# Patient Record
Sex: Female | Born: 1935 | Race: White | Hispanic: No | Smoking: Never smoker
Health system: Southern US, Community
[De-identification: ages and names within clinical notes are randomized; demographics above are authoritative.]

## PROBLEM LIST (undated history)

## (undated) DIAGNOSIS — Z9889 Other specified postprocedural states: Secondary | ICD-10-CM

## (undated) DIAGNOSIS — R351 Nocturia: Secondary | ICD-10-CM

## (undated) DIAGNOSIS — K219 Gastro-esophageal reflux disease without esophagitis: Secondary | ICD-10-CM

## (undated) DIAGNOSIS — H349 Unspecified retinal vascular occlusion: Secondary | ICD-10-CM

## (undated) DIAGNOSIS — N952 Postmenopausal atrophic vaginitis: Secondary | ICD-10-CM

## (undated) DIAGNOSIS — R3915 Urgency of urination: Secondary | ICD-10-CM

## (undated) DIAGNOSIS — Z87440 Personal history of urinary (tract) infections: Secondary | ICD-10-CM

## (undated) DIAGNOSIS — Z8742 Personal history of other diseases of the female genital tract: Secondary | ICD-10-CM

## (undated) DIAGNOSIS — N362 Urethral caruncle: Secondary | ICD-10-CM

## (undated) DIAGNOSIS — M1990 Unspecified osteoarthritis, unspecified site: Secondary | ICD-10-CM

## (undated) DIAGNOSIS — R32 Unspecified urinary incontinence: Secondary | ICD-10-CM

## (undated) DIAGNOSIS — Z4689 Encounter for fitting and adjustment of other specified devices: Secondary | ICD-10-CM

## (undated) DIAGNOSIS — I10 Essential (primary) hypertension: Secondary | ICD-10-CM

## (undated) DIAGNOSIS — E7800 Pure hypercholesterolemia, unspecified: Secondary | ICD-10-CM

## (undated) DIAGNOSIS — D259 Leiomyoma of uterus, unspecified: Secondary | ICD-10-CM

## (undated) DIAGNOSIS — R112 Nausea with vomiting, unspecified: Secondary | ICD-10-CM

## (undated) DIAGNOSIS — Z780 Asymptomatic menopausal state: Secondary | ICD-10-CM

## (undated) DIAGNOSIS — M810 Age-related osteoporosis without current pathological fracture: Secondary | ICD-10-CM

## (undated) HISTORY — DX: Age-related osteoporosis without current pathological fracture: M81.0

## (undated) HISTORY — DX: Personal history of other diseases of the female genital tract: Z87.42

## (undated) HISTORY — DX: Encounter for fitting and adjustment of other specified devices: Z46.89

## (undated) HISTORY — DX: Unspecified retinal vascular occlusion: H34.9

## (undated) HISTORY — DX: Other specified postprocedural states: Z98.890

## (undated) HISTORY — PX: THYROGLOSSAL DUCT CYST: SHX297

## (undated) HISTORY — DX: Urethral caruncle: N36.2

## (undated) HISTORY — DX: Pure hypercholesterolemia, unspecified: E78.00

## (undated) HISTORY — DX: Personal history of urinary (tract) infections: Z87.440

## (undated) HISTORY — PX: TOTAL HIP ARTHROPLASTY: SHX124

## (undated) HISTORY — DX: Essential (primary) hypertension: I10

## (undated) HISTORY — PX: CATARACT EXTRACTION: SUR2

## (undated) HISTORY — PX: THYROIDECTOMY: SHX17

## (undated) HISTORY — PX: HERNIA REPAIR: SHX51

## (undated) HISTORY — PX: TONSILLECTOMY: SHX5217

## (undated) HISTORY — DX: Asymptomatic menopausal state: Z78.0

## (undated) HISTORY — PX: TONSILLECTOMY: SUR1361

## (undated) HISTORY — DX: Postmenopausal atrophic vaginitis: N95.2

## (undated) HISTORY — PX: EYE SURGERY: SHX253

## (undated) HISTORY — DX: Leiomyoma of uterus, unspecified: D25.9

---

## 2004-10-11 ENCOUNTER — Ambulatory Visit: Payer: Self-pay | Admitting: Family Medicine

## 2004-10-20 ENCOUNTER — Ambulatory Visit: Payer: Self-pay | Admitting: Family Medicine

## 2005-10-13 ENCOUNTER — Ambulatory Visit: Payer: Self-pay | Admitting: Family Medicine

## 2006-10-15 ENCOUNTER — Ambulatory Visit: Payer: Self-pay | Admitting: Family Medicine

## 2007-10-24 ENCOUNTER — Ambulatory Visit: Payer: Self-pay | Admitting: Family Medicine

## 2007-10-28 ENCOUNTER — Inpatient Hospital Stay (HOSPITAL_COMMUNITY): Admission: RE | Admit: 2007-10-28 | Discharge: 2007-10-31 | Payer: Self-pay | Admitting: Orthopedic Surgery

## 2007-12-02 ENCOUNTER — Ambulatory Visit: Payer: Self-pay | Admitting: Family Medicine

## 2008-11-10 ENCOUNTER — Ambulatory Visit: Payer: Self-pay | Admitting: Family Medicine

## 2009-11-15 ENCOUNTER — Ambulatory Visit: Payer: Self-pay | Admitting: Family Medicine

## 2010-05-17 NOTE — Op Note (Signed)
NAMEDONNALYN, JURAN              ACCOUNT NO.:  [id number]   MEDICAL RECORD NO.:  [id number]          PATIENT TYPE:  INP   LOCATION:  0007                         FACILITY:  [facility]   PHYSICIAN:  Ollen Gross, M.D.    DATE OF BIRTH:  27-Oct-1935   DATE OF PROCEDURE:  10/28/2007  DATE OF DISCHARGE:                               OPERATIVE REPORT   PREOPERATIVE DIAGNOSIS:  Osteoarthritis left hip.   POSTOPERATIVE DIAGNOSIS:  Osteoarthritis left hip.   PROCEDURE:  Left total hip arthroplasty.   SURGEON:  Ollen Gross, M.D.   ASSISTANT:  Avel Peace PA-C   ANESTHESIA:  Spinal.   ESTIMATED BLOOD LOSS:  300 mL.   DRAINS:  Hemovac x1.   COMPLICATIONS:  None.   CONDITION:  Stable to recovery room.   CLINICAL NOTE:  Ms. Seybold is a 75 year old female with end-stage  arthritis of the left hip with progressively worsening pain and  dysfunction.  She has failed nonoperative management and presents for  total hip arthroplasty.   PROCEDURE IN DETAIL:  Successful administration of spinal anesthetic,  the patient is placed in the right lateral decubitus position with the  left side up and held with the hip positioner.  The lower extremity was  isolated from her perineum with plastic drapes and prepped and draped in  the usual sterile fashion.  Short posterolateral incision was made with  a 10 blade through subcutaneous tissue to the level of fascia lata which  was incised in line with skin incision.  The sciatic nerve was palpated  and protected and short external rotators isolated off the femur.  Capsulectomy is performed and the hip was dislocated.  The center of  femoral head is marked and the trial prosthesis is placed such that the  center of the trial head corresponds to the center of her native femoral  head.  Osteotomy line was marked on the femoral neck and osteotomy made  with an oscillating saw.  Femoral head was removed and the femur  retracted anteriorly to gain  acetabular exposure.   Acetabular retractors were placed and labrum and osteophytes removed.  Reaming starts at 45 mm coursing in increments of 2 to 51 mm and then a  52-mm Pinnacle acetabular shell was placed in anatomic position and  transfixed with two dome screws.  Trial 32-mm neutral +4 liner was  placed.   The femur was prepared with a canal finder and irrigation.  Axial  reaming is performed to 13.5 mm, proximal reaming to 18D and the sleeve  machined to a large.  18 D large trial sleeve is placed and an 18 x 13  stem and 36 plus 8 neck matching her native anteversion.  The 32.0 head  is placed and the hip reduces easily so went to 32.6 which had more  appropriate soft tissue tension.  She had great stability with full  extension, full external rotation, 70 degrees flexion, 40 degrees  abduction, 90 degrees internal rotation and 90 degrees of flexion and 70  degrees of internal rotation.  By placing the left leg on top of the  right, the leg lengths felt equal.  The hip was then dislocated.  All  trials were removed.  The permanent apex hole eliminator was placed with  the permanent 32 mm neutral +4 marathon liner.  On the femoral side we  did the 18 D large sleeve with 18 x 13 stem and 36 plus 8 neck matching  native anteversion.  The 32 plus 6 head is placed and the hips reduced  with the same stability parameters.  The wound was copiously irrigated  with saline solution and short rotators reattached to the femur through  drill holes.  Fascia lata was closed over Hemovac drain with interrupted  #1 Vicryl.  Subcu was closed with #1-0 and #2-0 Vicryl and subcuticular  running 4-0 Monocryl.  The drain was hooked to suction.  Incision  cleaned and dried and Steri-Strips and bulky sterile dressing applied.  The left lower extremity was then placed into a knee immobilizer and  the patient was awakened and transferred to recovery in stable  condition.      Ollen Gross, M.D.   Electronically Signed     FA/MEDQ  D:  10/28/2007  T:  10/28/2007  Job:  [id number]

## 2010-05-20 NOTE — H&P (Signed)
Haley Schneider, Haley Schneider              ACCOUNT NO.:  [id number]   MEDICAL RECORD NO.:  [id number]          PATIENT TYPE:  INP   LOCATION:                               FACILITY:  [facility]   PHYSICIAN:  Ollen Gross, M.D.    DATE OF BIRTH:  [birth year]   DATE OF ADMISSION:  10/28/2007  DATE OF DISCHARGE:                              HISTORY & PHYSICAL   CHIEF COMPLAINT:  Left hip pain.   HISTORY OF PRESENT ILLNESS:  The patient is a 75 year old female with  hip pain on the left.  She denies any injuries.  It has been bothering  her for quite a while.  It has progressively worsened over time.  She  has pain with range of motion, pain with weight bearing.  The patient  has elected to proceed with a total hip arthroplasty due to end-stage  osteoarthritis in the left hip, bone on bone.   ALLERGIES:  None.   CURRENT MEDICATIONS:  1. Synthroid 50 mcg once a day.  2. Hydrochlorothiazide 25 mg once a day.  3. Evista 60 mg once a day.  4. Simvastatin 10 mg once a day.  5. Multiple vitamins.   PRIMARY CARE PHYSICIAN:  Dr. Vonita Moss.   CURRENT MEDICAL HISTORY:  1. Hypertension.  2. Hypercholesterolemia.  3. Thyroid disease.  4. Varicose veins.  5. Glaucoma.  6. Urinary incontinence due to dropped bladder with a pessary.   REVIEW OF SYSTEMS:  Negative for any neurological issues, no pulmonary  issues.  Denies any cardiovascular issues, no GI or GU issues other than  her urinary incontinence.  Pessary works very well.  Thyroid is the only  endocrine issue.  She denies any hematological issues.   PAST SURGICAL HISTORY:  1. Thyroidectomy in 1965.  2. Tonsillectomy in 1968.  3. Left groin hernia repair in early 1970s.   The patient's only complication was nausea and vomiting with anesthesia.   FAMILY MEDICAL HISTORY:  Father is deceased from lung cancer at the age  of 81.  Mother has Alzheimer's at the age of 30.   SOCIAL HISTORY:  The patient is widowed, never smoked, no alcohol  or  drugs, 2 grown children, lives alone.   PHYSICAL EXAMINATION:  VITAL SIGNS:  Height is 5 feet 6 inches, weight  is 67 pounds, blood pressure is 142/72, pulse 74 and regular,  respirations are 12, patient is afebrile.  GENERAL:  This is a healthy-appearing female, conscious, alert, and  appropriate.  Appears to be a good historian.  Ambulates with a cane on  her right side.  HEENT:  Head was normocephalic.  Pupils are equal, round, and reactive,  extraocular movements intact, gross hearing is intact.  NECK:  Supple, no palpable lymphadenopathy, good range of motion.  CHEST:  Lung sounds were clear and equal bilaterally, no wheezes, rales,  rhonchi.  HEART:  Regular rate and rhythm.  No murmurs.  ABDOMEN:  Soft, bowel sounds are present.  EXTREMITIES:  Upper extremities had good range of motion.  Motor  strength is 5/5.  Lower extremities: Left hip, she was able to  fully  extend.  She can flex it up to 90 degrees.  She had about 10 degrees of  internal/external rotation limited by pain and mechanical.  Right hip  had full extension, flexion up to 120 degrees with 30 degrees  internal/external rotation, both knees had full extension.  She has leg  varus deformities bilateral knees, calves were soft, good motion at the  ankles.  PERIPHERAL VASCULAR:  Carotid pulses were 2+, no bruits.  Radial pulses  were 2+.  Dorsalis pedis pulses were 1+.  She had a few scattered  varicosities about the lower extremities, no pigmentation changes.  NEURO:  The patient is conscious, alert, and appropriate.  She had no  gross neurologic defects noted.  BREAST/RECTAL/GU EXAMS:  Deferred at this time.   IMPRESSION:  1. End-stage osteoarthritis, left hip.  2. Hypertension.  3. Hypercholesterolemia.  4. Thyroid disease.  5. Urinary incontinence with dropped bladder and pessary.  6. Glaucoma.   PLAN:  The patient will undergo all routine labs and tests prior to  having a left total hip arthroplasty  by Dr. Lequita Halt at [facility] on October 28, 2007.      Jamelle Rushing, P.A.      Ollen Gross, M.D.  Electronically Signed    RWK/MEDQ  D:  09/30/2007  T:  09/30/2007  Job:  [id number]

## 2010-05-20 NOTE — Discharge Summary (Signed)
NAMELAQUITHA, HESLIN              ACCOUNT NO.:  [id number]   MEDICAL RECORD NO.:  [id number]          PATIENT TYPE:  INP   LOCATION:  1604                         FACILITY:  [facility]   PHYSICIAN:  Ollen Gross, M.D.    DATE OF BIRTH:  [birth year]   DATE OF ADMISSION:  10/28/2007  DATE OF DISCHARGE:  10/31/2007                               DISCHARGE SUMMARY   ADMITTING DIAGNOSES:  1. End-stage arthritis, left hip.  2. Hypertension.  3. Hypercholesterolemia.  4. Thyroid disease.  5. Urinary incontinence.  6. Glaucoma.   DISCHARGE DIAGNOSES:  1. Osteoarthritis, left hip status post left total hip replacement      arthroplasty.  2. Mild postop hyponatremia, improving.  3. Postop hypokalemia, improved.  4. Hypertension.  5. Hypercholesterolemia.  6. Thyroid disease.  7. Urinary incontinence.  8. Glaucoma.   PROCEDURE:  October 28, 2007, left total hip.  Surgeon, Dr. Lequita Halt.  Assistant, Avel Peace, P.A.-C.  Spinal anesthesia.   CONSULTS:  None.   BRIEF HISTORY:  Ms. Hands is a 75 year old female with end-stage  arthritis, left hip, progressive, worsening pain and dysfunction, now  presents for total hip arthroplasty.   LABORATORY DATA:  Preop CBC showed hemoglobin 13.8, hematocrit of 39.2,  white cell count 7.8.  Chem panel showed a little low sodium of 132,  chloride low at 94, remaining Chem panel within normal limits.  Serial  CBCs were followed.  Hemoglobin dropped to 11.1 then 10.7, last noted H  and H 9.4 and 27.4.  PT/INR 14.3 and 29 respectively.  INR 1.1.  Serial  pro times followed per Coumadin protocol.  Last noted PT/INR 19.2 and  1.5.  serial BMETs were followed.  Sodium did come back up to 135, then  got as low as 133, then came back up to 134.  Potassium started out  normal, dropped down to 3.3, back up to 4.5.  blood group type O-.   EKG, October 25, 2007, sinus rhythm, occasional premature ventricular  complexes, unconfirmed.   Left hip films, October 25, 2007, __________changes, OA, left hip.  Two-  view chest, October 25, 2007, hyperaeration of the lungs, no acute chest  findings.  Portable hip and pelvis film, October 28, 2007, well-seated  components, left total hip.   HOSPITAL COURSE:  Patient admitted to [facility], tolerated  procedure well, later transferred to the recovery room on orthopedic  floor.  Started on PCA and p.o. analgesics.  Given Coumadin for DVT  prophylaxis.  Had a little bit of pain on the morning of surgery.  She  needed to take her Synthroid medication early in the morning so we  changed the dosage time on that.  She is having a little bit of pain so  we left the PCA.  Blood pressure was stable.  Hemoglobin looked good.  Her output was excellent.  Started getting up with therapy, did well  walking over 50 feet.  By day 2, dressing was changed.  Incision looked  good.  Continued to progress with therapy.  Sodium and potassium were a  little low so we  stopped the fluids and gave her potassium supplements.  Pressure was stable.  Incisions healing well.  Continued to progress  with therapy and by day 3 she was doing well, meeting her goals,  tolerating her meds, and discharged home.   DISCHARGE PLAN:  1. Patient discharged home, October 31, 2007.  2. Discharge diagnoses:  Please see above.  3. Discharge meds:  Coumadin, Percocet, and Robaxin.   FOLLOWUP:  Two weeks.   DIET:  Low-sodium, low-cholesterol diet.   ACTIVITY:  Partial weightbearing, 25% to 50%, left lower extremity.  Home health PT and home health nursing.   DISPOSITION:  Home.   CONDITION UPON DISCHARGE:  Improving.      Alexzandrew L. Perkins, P.A.C.      Ollen Gross, M.D.  Electronically Signed    ALP/MEDQ  D:  12/16/2007  T:  12/16/2007  Job:  [id number]   cc:   Ollen Gross, M.D.  Fax: [phone]   Vonita Moss, M.D.

## 2010-10-04 LAB — COMPREHENSIVE METABOLIC PANEL
AST: 22
BUN: 8
Total Bilirubin: 0.6

## 2010-10-04 LAB — BASIC METABOLIC PANEL
BUN: 5 — ABNORMAL LOW
BUN: 6
CO2: 31
Calcium: 8.8
Calcium: 8.9
Creatinine, Ser: 0.59
GFR calc Af Amer: >60
GFR calc Af Amer: >60
GFR calc non Af Amer: >60
Glucose, Bld: 105 — ABNORMAL HIGH
Glucose, Bld: 108 — ABNORMAL HIGH
Glucose, Bld: 89
Potassium: 3.3 — ABNORMAL LOW
Potassium: 3.8
Potassium: 4.5
Sodium: 133 — ABNORMAL LOW
Sodium: 134 — ABNORMAL LOW

## 2010-10-04 LAB — CBC
HCT: 31.8 — ABNORMAL LOW
Hemoglobin: 10.7 — ABNORMAL LOW
Hemoglobin: 11.1 — ABNORMAL LOW
Hemoglobin: 9.4 — ABNORMAL LOW
MCHC: 33.6
MCV: 87.3
MCV: 88
Platelets: 213
Platelets: 236
RDW: 13.9
RDW: 14
WBC: 8.5

## 2010-10-04 LAB — PROTIME-INR
INR: 1.1
INR: 1.1
INR: 1.3

## 2010-10-04 LAB — TYPE AND SCREEN
ABO/RH(D): O NEG
Antibody Screen: NEGATIVE

## 2010-10-04 LAB — ABO/RH: ABO/RH(D): O NEG

## 2010-11-30 ENCOUNTER — Ambulatory Visit: Payer: Self-pay | Admitting: Family Medicine

## 2011-01-17 ENCOUNTER — Ambulatory Visit: Payer: Self-pay | Admitting: Otolaryngology

## 2011-02-15 ENCOUNTER — Ambulatory Visit: Payer: Self-pay | Admitting: Otolaryngology

## 2011-02-15 LAB — CBC WITH DIFFERENTIAL/PLATELET
Basophil #: 0 10*3/uL (ref 0.0–0.1)
Eosinophil #: 0.1 10*3/uL (ref 0.0–0.7)
HCT: 41.1 % (ref 35.0–47.0)
HGB: 13.5 g/dL (ref 12.0–16.0)
Lymphocyte #: 2.2 10*3/uL (ref 1.0–3.6)
Lymphocyte %: 32.4 %
MCH: 28.3 pg (ref 26.0–34.0)
MCHC: 32.8 g/dL (ref 32.0–36.0)
MCV: 86 fL (ref 80–100)
Monocyte %: 7.9 %
Neutrophil #: 4 10*3/uL (ref 1.4–6.5)
RDW: 14.3 % (ref 11.5–14.5)

## 2011-02-15 LAB — BASIC METABOLIC PANEL
Anion Gap: 8 (ref 7–16)
BUN: 9 mg/dL (ref 7–18)
Calcium, Total: 9.8 mg/dL (ref 8.5–10.1)
Chloride: 98 mmol/L (ref 98–107)
EGFR (Non-African Amer.): >60
Glucose: 80 mg/dL (ref 65–99)
Osmolality: 268 (ref 275–301)

## 2011-02-23 ENCOUNTER — Ambulatory Visit: Payer: Self-pay | Admitting: Otolaryngology

## 2011-12-05 ENCOUNTER — Ambulatory Visit: Payer: Self-pay | Admitting: Family Medicine

## 2012-12-09 ENCOUNTER — Ambulatory Visit: Payer: Self-pay | Admitting: Family Medicine

## 2013-04-10 DIAGNOSIS — E785 Hyperlipidemia, unspecified: Secondary | ICD-10-CM | POA: Insufficient documentation

## 2013-04-10 DIAGNOSIS — I10 Essential (primary) hypertension: Secondary | ICD-10-CM | POA: Insufficient documentation

## 2013-04-10 DIAGNOSIS — M810 Age-related osteoporosis without current pathological fracture: Secondary | ICD-10-CM | POA: Insufficient documentation

## 2013-04-10 DIAGNOSIS — M1990 Unspecified osteoarthritis, unspecified site: Secondary | ICD-10-CM | POA: Insufficient documentation

## 2013-04-10 DIAGNOSIS — E039 Hypothyroidism, unspecified: Secondary | ICD-10-CM | POA: Insufficient documentation

## 2013-08-16 IMAGING — CT CT NECK WITH CONTRAST
1 of 2 series · 9 of 14 positions shown, 12 images · IV contrast (agent unspecified)
Comparison: none

REASON FOR EXAM: neck mass
COMMENTS:

PROCEDURE:     KCT - KCT NECK WITH CONTRAST  - January 17, 2011  [DATE]
RESULT:
TECHNIQUE: Helical 3 mm sections are obtained from the skull base to the
vertex status post intravenous administration of 75 ml of [id number].

[Series 2: neck 3.0 3 · axial · 0.39mm/px · z∈[-291,-57]mm · 9 of 98 slices shown, 12 images]
[im 10/98  soft-tissue]
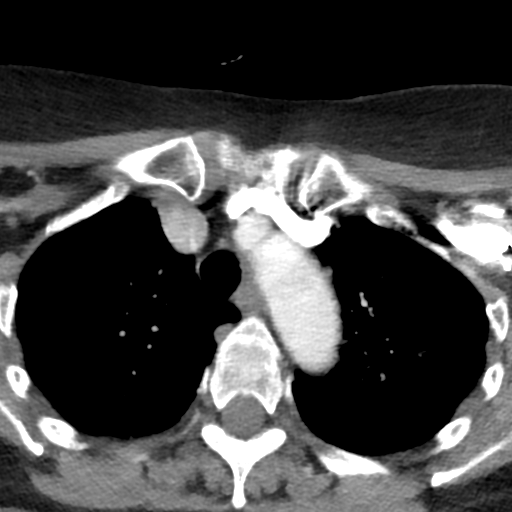
[im 10/98  bone]
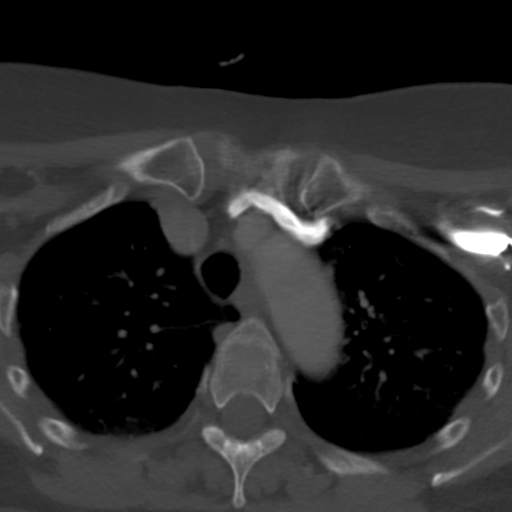
[im 20/98  bone]
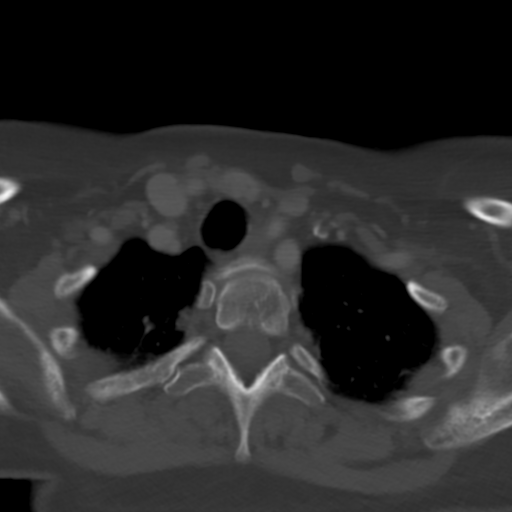
[im 30/98  bone]
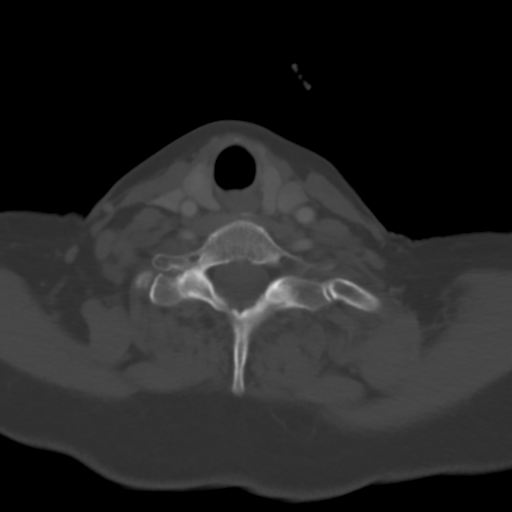
[im 39/98  bone]
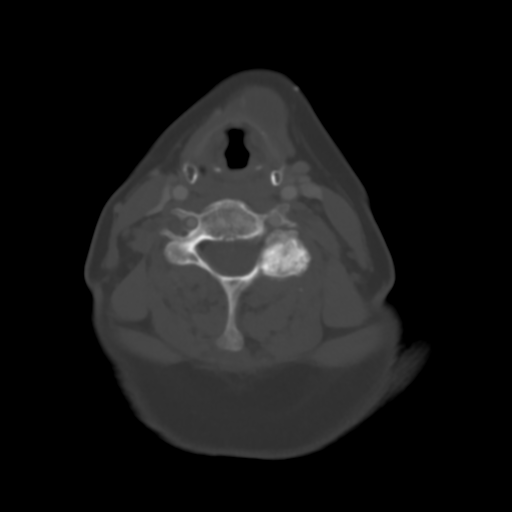
[im 49/98  soft-tissue]
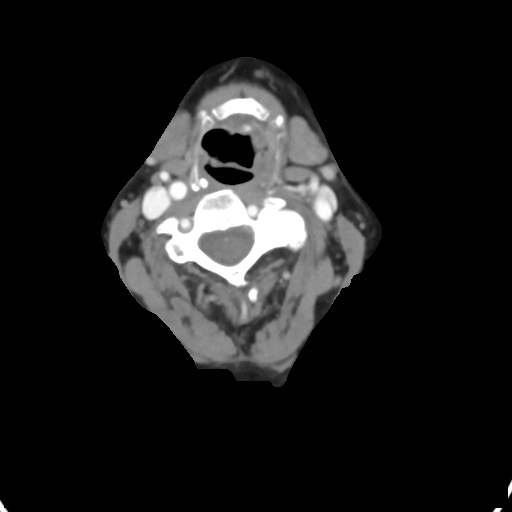
[im 49/98  bone]
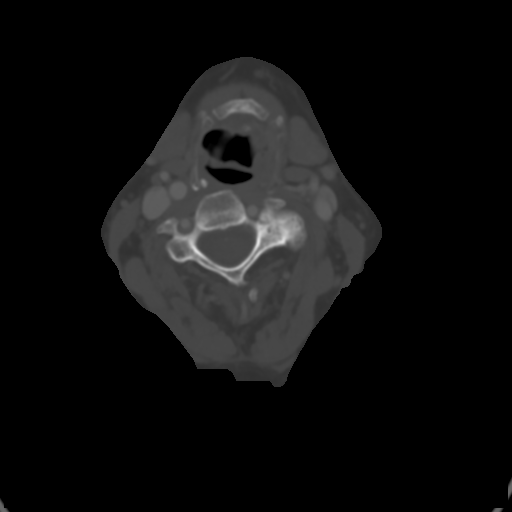
[im 59/98  bone]
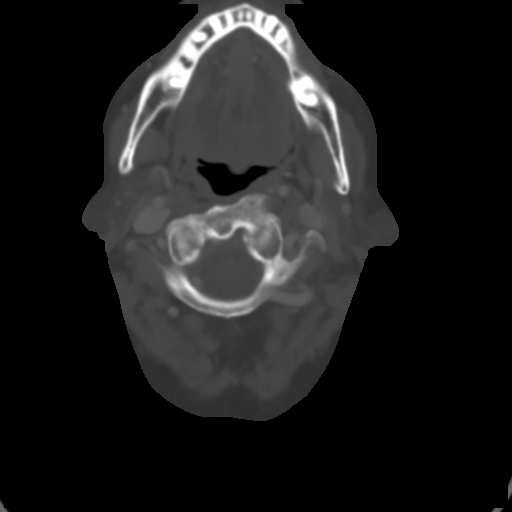
[im 68/98  bone]
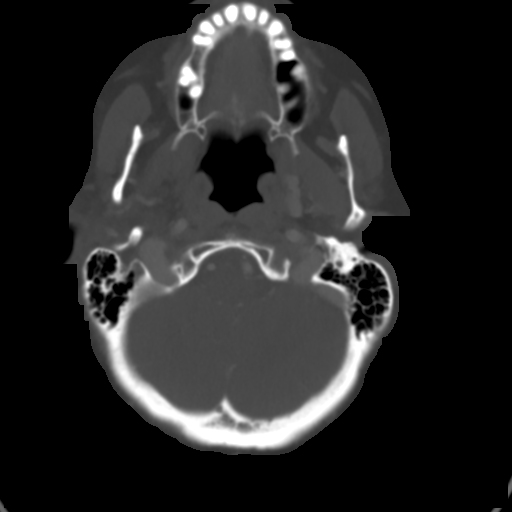
[im 78/98  bone]
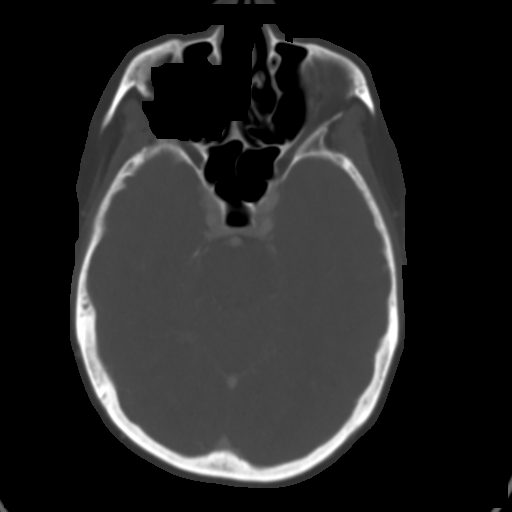
[im 88/98  soft-tissue]
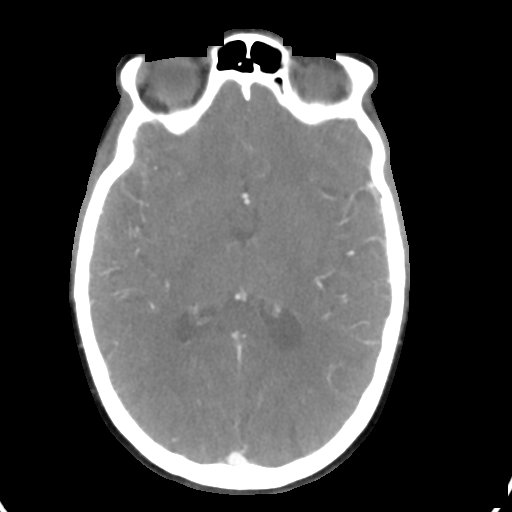
[im 88/98  bone]
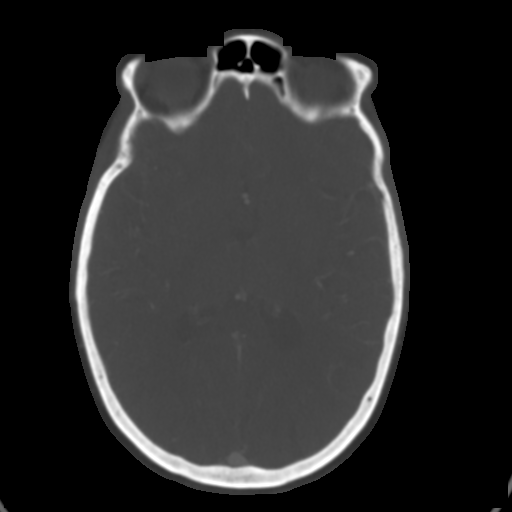

[9 of 14 positions shown; findings below may reference images not displayed]

FINDINGS: A 1.93 x 2.35 cm, cystic appearing mass with Hounsfield units of
19 projects within the region of palpable concern along the medial portion
of the neck just below the hyoid and extending to the level of the thyroid
cartilage. This area has a CT appearance of a thyroglossal duct cyst. A
non-benign component is not clearly identified on the CT scan though
considering the patient's age cannot be completely excluded.

No further neck masses, free fluid or loculated fluid collections are
identified. Small, subcentimeter lymph nodes are identified within the
anterior and posterior cervical chains and these appear to be within normal
limits. The parotid glands and submandibular glands are symmetric. The
glottal regions as well as the oral and nasopharyngeal regions are
unremarkable. The visualized spaces of the neck appear unremarkable. The
airway is patent. The vascular structures are unremarkable.
IMPRESSION: Cystic mass in the region of palpable concern with imaging
characteristics consistent with a thyroglossal duct cyst. Considering the
patient's age, a non-benign component cannot be excluded though is not
clearly appreciated on the present study.

## 2013-12-17 ENCOUNTER — Ambulatory Visit: Payer: Self-pay | Admitting: Family Medicine

## 2014-01-14 DIAGNOSIS — H3581 Retinal edema: Secondary | ICD-10-CM | POA: Diagnosis not present

## 2014-01-14 DIAGNOSIS — H34832 Tributary (branch) retinal vein occlusion, left eye: Secondary | ICD-10-CM | POA: Diagnosis not present

## 2014-02-04 DIAGNOSIS — H34832 Tributary (branch) retinal vein occlusion, left eye: Secondary | ICD-10-CM | POA: Diagnosis not present

## 2014-02-04 DIAGNOSIS — H3581 Retinal edema: Secondary | ICD-10-CM | POA: Diagnosis not present

## 2014-03-03 DIAGNOSIS — I10 Essential (primary) hypertension: Secondary | ICD-10-CM | POA: Diagnosis not present

## 2014-03-18 DIAGNOSIS — M1711 Unilateral primary osteoarthritis, right knee: Secondary | ICD-10-CM | POA: Diagnosis not present

## 2014-03-18 DIAGNOSIS — Z96642 Presence of left artificial hip joint: Secondary | ICD-10-CM | POA: Diagnosis not present

## 2014-03-18 DIAGNOSIS — Z471 Aftercare following joint replacement surgery: Secondary | ICD-10-CM | POA: Diagnosis not present

## 2014-03-18 DIAGNOSIS — M76822 Posterior tibial tendinitis, left leg: Secondary | ICD-10-CM | POA: Diagnosis not present

## 2014-03-18 DIAGNOSIS — M76821 Posterior tibial tendinitis, right leg: Secondary | ICD-10-CM | POA: Diagnosis not present

## 2014-03-18 DIAGNOSIS — M1611 Unilateral primary osteoarthritis, right hip: Secondary | ICD-10-CM | POA: Diagnosis not present

## 2014-04-01 DIAGNOSIS — B49 Unspecified mycosis: Secondary | ICD-10-CM | POA: Diagnosis not present

## 2014-04-01 DIAGNOSIS — N952 Postmenopausal atrophic vaginitis: Secondary | ICD-10-CM | POA: Diagnosis not present

## 2014-04-01 DIAGNOSIS — N819 Female genital prolapse, unspecified: Secondary | ICD-10-CM | POA: Diagnosis not present

## 2014-04-01 DIAGNOSIS — Z4689 Encounter for fitting and adjustment of other specified devices: Secondary | ICD-10-CM | POA: Diagnosis not present

## 2014-04-21 DIAGNOSIS — M1611 Unilateral primary osteoarthritis, right hip: Secondary | ICD-10-CM | POA: Diagnosis not present

## 2014-04-26 NOTE — Op Note (Signed)
PATIENT NAME:  Haley Schneider, Haley Schneider MR#:  [id number] DATE OF BIRTH:  02/28/1935  DATE OF PROCEDURE:  02/23/2011  PREOPERATIVE DIAGNOSIS: Thyroglossal duct cyst.  POSTOPERATIVE DIAGNOSIS: Thyroglossal duct cyst.   PROCEDURE: [place].   SURGEON: Janalee Dane, MD   ASSISTANT: Mahlon Gammon, MD   DESCRIPTION OF PROCEDURE: The patient was placed in the supine position on the operating room table. After general endotracheal anesthesia had been induced, the patient was placed on a shoulder roll. Neck crease was identified, locally anesthetized, prepped and draped in the usual fashion. A 15 blade was used to make a large fusiform incision around the lesion to give excellent exposure and subplatysmal flaps were elevated superiorly and inferiorly. The strap muscles were reflected and the central dissection was carried out from inferior to superior. The middle third of the hyoid bone was removed with bone nippers. The entire mass and central third of the hyoid was then sent oriented for pathology in formalin. The wound was closed over a 7 mm Jackson-Pratt drain. The patient was returned to anesthesia, allowed to emerge from anesthesia in the operating room, and taken to the recovery room in stable condition. There were no complications. Estimated blood loss 15 mL.  ____________________________ J. Nadeen Landau, MD jmc:drc D: 02/23/2011 20:33:09 ET T: 02/24/2011 07:21:12 ET JOB#: [id number]  cc: Janalee Dane, MD, <Dictator> Nicholos Johns MD ELECTRONICALLY SIGNED 03/06/2011 10:03

## 2014-04-28 DIAGNOSIS — M1611 Unilateral primary osteoarthritis, right hip: Secondary | ICD-10-CM | POA: Diagnosis not present

## 2014-04-28 DIAGNOSIS — M1711 Unilateral primary osteoarthritis, right knee: Secondary | ICD-10-CM | POA: Diagnosis not present

## 2014-05-20 DIAGNOSIS — E785 Hyperlipidemia, unspecified: Secondary | ICD-10-CM

## 2014-05-20 DIAGNOSIS — E039 Hypothyroidism, unspecified: Secondary | ICD-10-CM

## 2014-05-20 DIAGNOSIS — I10 Essential (primary) hypertension: Secondary | ICD-10-CM

## 2014-05-20 DIAGNOSIS — M8580 Other specified disorders of bone density and structure, unspecified site: Secondary | ICD-10-CM | POA: Insufficient documentation

## 2014-05-27 DIAGNOSIS — H3562 Retinal hemorrhage, left eye: Secondary | ICD-10-CM | POA: Diagnosis not present

## 2014-05-27 DIAGNOSIS — H43813 Vitreous degeneration, bilateral: Secondary | ICD-10-CM | POA: Diagnosis not present

## 2014-05-27 DIAGNOSIS — H3581 Retinal edema: Secondary | ICD-10-CM | POA: Diagnosis not present

## 2014-05-27 DIAGNOSIS — H34832 Tributary (branch) retinal vein occlusion, left eye: Secondary | ICD-10-CM | POA: Diagnosis not present

## 2014-06-10 ENCOUNTER — Encounter: Payer: Self-pay | Admitting: Family Medicine

## 2014-06-10 ENCOUNTER — Ambulatory Visit (INDEPENDENT_AMBULATORY_CARE_PROVIDER_SITE_OTHER): Payer: Medicare PPO | Admitting: Family Medicine

## 2014-06-10 VITALS — BP 149/86 | HR 81 | Temp 98.0°F | Wt 153.0 lb

## 2014-06-10 DIAGNOSIS — M8580 Other specified disorders of bone density and structure, unspecified site: Secondary | ICD-10-CM

## 2014-06-10 DIAGNOSIS — I10 Essential (primary) hypertension: Secondary | ICD-10-CM

## 2014-06-10 DIAGNOSIS — E785 Hyperlipidemia, unspecified: Secondary | ICD-10-CM

## 2014-06-10 DIAGNOSIS — N8110 Cystocele, unspecified: Secondary | ICD-10-CM | POA: Diagnosis not present

## 2014-06-10 DIAGNOSIS — E890 Postprocedural hypothyroidism: Secondary | ICD-10-CM

## 2014-06-10 MED ORDER — LOSARTAN POTASSIUM 50 MG PO TABS: 50.0000 mg | ORAL_TABLET | Freq: Every day | ORAL | Status: DC

## 2014-06-10 NOTE — Assessment & Plan Note (Signed)
Check TSH in 3 weeks with other labs; adjust med if needed

## 2014-06-10 NOTE — Assessment & Plan Note (Signed)
Managed by GYN

## 2014-06-10 NOTE — Assessment & Plan Note (Signed)
Stop amlodipine today and start new medicine; side effects may include dry cough; return in 3 weeks for check BMP; do NOT take extra potassium; limit sodium, red meat, try to follow DASH guidelines

## 2014-06-10 NOTE — Assessment & Plan Note (Signed)
Limit eggs, cheese, bacon, red meat; check lipids in 3 weeks

## 2014-06-10 NOTE — Patient Instructions (Addendum)
Your goal blood pressure is less than  150 on top Try to limit the sodium in your diet.  Ideally, consume less than 1.5 grams (less than 1,500mg ) per day. Do not add salt when cooking or at the table.  Check the sodium amount on labels when shopping, and choose items lower in sodium when given a choice. Eat a diet rich in fruits and vegetables and whole grains. STOP the amlodipine and START the new losartan for blood pressure Monitor your blood pressure occasionally would be helpful (family member could check or you could check if out and about at a pharmacy) Limit saturated fats Really try to stay off of chairs and ladders and prevent falls Three servings of calcium a day (kale, spinach, almonds, etc.) Return in 3 weeks for fasting labs and BP check with Amy Return in early August to see me  DASH Eating Plan DASH stands for "Dietary Approaches to Stop Hypertension." The DASH eating plan is a healthy eating plan that has been shown to reduce high blood pressure (hypertension). Additional health benefits may include reducing the risk of type 2 diabetes mellitus, heart disease, and stroke. The DASH eating plan may also help with weight loss. WHAT DO I NEED TO KNOW ABOUT THE DASH EATING PLAN? For the DASH eating plan, you will follow these general guidelines:  Choose foods with a percent daily value for sodium of less than 5% (as listed on the food label).  Use salt-free seasonings or herbs instead of table salt or sea salt.  Check with your health care provider or pharmacist before using salt substitutes.  Eat lower-sodium products, often labeled as "lower sodium" or "no salt added."  Eat fresh foods.  Eat more vegetables, fruits, and low-fat dairy products.  Choose whole grains. Look for the word "whole" as the first word in the ingredient list.  Choose fish and skinless chicken or [place] more often than red meat. Limit fish, poultry, and meat to 6 oz (170 g) each day.  Limit sweets,  desserts, sugars, and sugary drinks.  Choose heart-healthy fats.  Limit cheese to 1 oz (28 g) per day.  Eat more home-cooked food and less restaurant, buffet, and fast food.  Limit fried foods.  Cook foods using methods other than frying.  Limit canned vegetables. If you do use them, rinse them well to decrease the sodium.  When eating at a restaurant, ask that your food be prepared with less salt, or no salt if possible. WHAT FOODS CAN I EAT? Seek help from a dietitian for individual calorie needs. Grains Whole grain or whole wheat bread. Brown rice. Whole grain or whole wheat pasta. Quinoa, bulgur, and whole grain cereals. Low-sodium cereals. Corn or whole wheat flour tortillas. Whole grain cornbread. Whole grain crackers. Low-sodium crackers. Vegetables Fresh or frozen vegetables (raw, steamed, roasted, or grilled). Low-sodium or reduced-sodium tomato and vegetable juices. Low-sodium or reduced-sodium tomato sauce and paste. Low-sodium or reduced-sodium canned vegetables.  Fruits All fresh, canned (in natural juice), or frozen fruits. Meat and Other Protein Products Ground beef (85% or leaner), grass-fed beef, or beef trimmed of fat. Skinless chicken or [place]. Ground chicken or [place]. Pork trimmed of fat. All fish and seafood. Eggs. Dried beans, peas, or lentils. Unsalted nuts and seeds. Unsalted canned beans. Dairy Low-fat dairy products, such as skim or 1% milk, 2% or reduced-fat cheeses, low-fat ricotta or cottage cheese, or plain low-fat yogurt. Low-sodium or reduced-sodium cheeses. Fats and Oils Tub margarines without trans fats. Light or  reduced-fat mayonnaise and salad dressings (reduced sodium). Avocado. Safflower, olive, or canola oils. Natural peanut or almond butter. Other Unsalted popcorn and pretzels. The items listed above may not be a complete list of recommended foods or beverages. Contact your dietitian for more options. WHAT FOODS ARE NOT  RECOMMENDED? Grains White bread. White pasta. White rice. Refined cornbread. Bagels and croissants. Crackers that contain trans fat. Vegetables Creamed or fried vegetables. Vegetables in a cheese sauce. Regular canned vegetables. Regular canned tomato sauce and paste. Regular tomato and vegetable juices. Fruits Dried fruits. Canned fruit in light or heavy syrup. Fruit juice. Meat and Other Protein Products Fatty cuts of meat. Ribs, chicken wings, bacon, sausage, bologna, salami, chitterlings, fatback, hot dogs, bratwurst, and packaged luncheon meats. Salted nuts and seeds. Canned beans with salt. Dairy Whole or 2% milk, cream, half-and-half, and cream cheese. Whole-fat or sweetened yogurt. Full-fat cheeses or blue cheese. Nondairy creamers and whipped toppings. Processed cheese, cheese spreads, or cheese curds. Condiments Onion and garlic salt, seasoned salt, table salt, and sea salt. Canned and packaged gravies. Worcestershire sauce. Tartar sauce. Barbecue sauce. Teriyaki sauce. Soy sauce, including reduced sodium. Steak sauce. Fish sauce. Oyster sauce. Cocktail sauce. Horseradish. Ketchup and mustard. Meat flavorings and tenderizers. Bouillon cubes. Hot sauce. Tabasco sauce. Marinades. Taco seasonings. Relishes. Fats and Oils Butter, stick margarine, lard, shortening, ghee, and bacon fat. Coconut, palm kernel, or palm oils. Regular salad dressings. Other Pickles and olives. Salted popcorn and pretzels. The items listed above may not be a complete list of foods and beverages to avoid. Contact your dietitian for more information. WHERE CAN I FIND MORE INFORMATION? National Heart, Lung, and [employer] Institute: travelstabloid.com Document Released: 12/08/2010 Document Revised: 05/05/2013 Document Reviewed: 10/23/2012 [facility] Patient Information 2015 [place], [place]. This information is not intended to replace advice given to you by your health care provider. Make  sure you discuss any questions you have with your health care provider.

## 2014-06-10 NOTE — Progress Notes (Signed)
Patient ID: Haley Schneider, female   DOB: [birth year], 79 y.o.   MRN: [id number]   Subjective:   Haley Schneider is a 79 y.o. female here for follow-up today  Prob:  Hypertension Side effects from current medicine; wants to stop ccb and use old medicine Patient has had hypertension since 12-13 years ago  Checking blood pressure away from here?  no Hypertension-associated complications:  none If taking medicines, are you taking them regularly?  yes  Salt Trying to limit sodium / salt when buying foods at the grocery store?  yes Trying to limit added salt when cooking and at the table?  yes Sedentary lifestyle Exercise/activity level:  sedentary (essential NO activity), that's because of her right hip; needs replacement, disappointed that she can't be as active as usual Siblings / family history Does high blood pressure run in your family?   YES (father) Smoking Do you smoke?  no Snoring / sleep apnea Do you snore or have sleep apnea?  no sleep apnea, but she's a widow Stress Do you feel like you are under excessive stress?    YES but never one to not get through it Stroh's (alcohol) Do you drink alcohol  no  Sudafed (decongestants) Do you use any OTC decongestant products like Allegra-D, Claritin-D, Zyrtec-D, Tylenol Cold and Sinus, etc.?  no   Hip arthritis; scheduled for the end of August; will need pre-op clearance prior to surgery; will schedule and do labs then  Osteoporosis; she is on raloxifene daily; no side effects; takes calcium supplement; not a milk drinker; eats a salad daily; eats yogurt and ice cream; not many almonds, but does eat walnuts  Prob:  High cholesterol Patient has had high cholesterol for years Dietary intake: Eats chicken and red meat, loves pork; eats meat daily Do you eat more than 3 eggs per week  no Do you try to limit saturated fats in your diet?  NO  Exercise: Exercise/activity level:  sedentary (essential NO activity) Does high  cholesterol run in your family?   no   Past Medical History  Diagnosis Date  . OP (osteoporosis)   . Menopause   . Fibroid, uterine   . Hypercholesteremia   . History of abnormal cervical Pap smear   . Hypothyroid   . Hypertension   . Vaginal vault prolapse   . Vaginal atrophy   . Urethral caruncle   . History of urinary tract infection   . Pessary maintenance     # 5 ring with support  . Female bladder prolapse    Past Surgical History  Procedure Laterality Date  . Hernia repair    . Tonsillectomy    . Thyroidectomy    . Total hip arthroplasty    . Cataract extraction    . Thyroglossal duct cyst     Family History  Problem Relation Age of Onset  . Alzheimer's disease Mother   . Cancer Father     lung  . Hypertension Father   . Diabetes Son   . Stroke Paternal Grandfather    History  Substance Use Topics  . Smoking status: Never Smoker   . Smokeless tobacco: Never Used  . Alcohol Use: No   Current Outpatient Prescriptions on File Prior to Visit  Medication Sig Dispense Refill  . aspirin EC 81 MG tablet Take 81 mg by mouth daily.    . B Complex-C-E-Zn (B COMPLEX-C-E-ZINC) tablet Take 1 tablet by mouth daily.    . Biotin 10 MG CAPS  Take 1 mg by mouth daily.    Marland Kitchen levothyroxine (SYNTHROID, LEVOTHROID) 50 MCG tablet Take 50 mcg by mouth daily before breakfast.    . raloxifene (EVISTA) 60 MG tablet Take 60 mg by mouth daily.    . vitamin B-12 (CYANOCOBALAMIN) 100 MCG tablet Take 100 mcg by mouth daily.     No current facility-administered medications on file prior to visit.   No Known Allergies ------------- Review of Systems  Constitutional: Negative for fever and chills.  HENT: Negative for hearing loss.   Eyes:       Doesn't see well with the left eye retinal problem; 20/60 vision in that eye  Cardiovascular: Positive for leg swelling (thinks it is from amlodipine).  Gastrointestinal: Negative for blood in stool.  Skin: Negative for itching and rash.   Neurological: Negative for tremors.  Psychiatric/Behavioral: Negative for depression.       Just frustrated that she can't do as much with hip problem, hip pain, but no outright depression    No results found for: CHOL, HDL, LDLCALC, LDLDIRECT, TRIG, CHOLHDL No results found for: HGBA1C No results found for: Derl Barrow Lab Results  Component Value Date   NA 135* 02/15/2011   K 3.6 02/15/2011   CL 98 02/15/2011   CO2 29 02/15/2011   Lab Results  Component Value Date   CREATININE 0.59* 02/15/2011   Lab Results  Component Value Date   WBC 6.9 02/15/2011   HGB 13.5 02/15/2011   HCT 41.1 02/15/2011   MCV 86 02/15/2011   PLT 285 02/15/2011    Objective:   Filed Vitals:   06/10/14 1056  BP: 149/86  Pulse: 81  Temp: 98 F (36.7 C)  Weight: 153 lb (69.4 kg)  SpO2: 95%   Body mass index is 25.87 kg/(m^2). Wt Readings from Last 3 Encounters:  06/10/14 153 lb (69.4 kg)  12/11/13 145 lb (65.772 kg)   Physical Exam  Constitutional: She appears well-developed and well-nourished.  HENT:  Head: Normocephalic and atraumatic.  Mucous membranes moist  Eyes: EOM are normal. No scleral icterus.  Neck: No thyromegaly present.  Cardiovascular: Normal rate, regular rhythm and normal heart sounds.   No murmur heard. Pulmonary/Chest: Effort normal and breath sounds normal. No respiratory distress.  Abdominal: She exhibits no distension.  Musculoskeletal: She exhibits edema. She exhibits no tenderness.  Thoracic kyphosis noted; no erythema or increased warmth in the legs  Neurological: She is alert.  Skin: Skin is warm and dry. No rash noted.  Psychiatric: She has a normal mood and affect. Her behavior is normal.    Assessment/Plan:   Problem List Items Addressed This Visit      Cardiovascular and Mediastinum   Hypertension - Primary    Stop amlodipine today and start new medicine; side effects may include dry cough; return in 3 weeks for check BMP; do NOT take  extra potassium; limit sodium, red meat, try to follow DASH guidelines      Relevant Medications   losartan (COZAAR) 50 MG tablet     Endocrine   Hypothyroidism    Check TSH in 3 weeks with other labs; adjust med if needed        Musculoskeletal and Integument   Osteopenia    Last DEXA reviewed; pointed out thoracic kyphosis, risk of fracture; she does not want DEXA scan ordered; three servings of calcium daily; check vit D in 3 weeks and see if supplement needed; fall precautions encouraged  Genitourinary   Female bladder prolapse    Managed by GYN        Other   Hyperlipidemia    Limit eggs, cheese, bacon, red meat; check lipids in 3 weeks      Relevant Medications   losartan (COZAAR) 50 MG tablet       Meds ordered this encounter  Medications  . traMADol (ULTRAM) 50 MG tablet    Sig:   . losartan (COZAAR) 50 MG tablet    Sig: Take 1 tablet (50 mg total) by mouth daily.    Dispense:  90 tablet    Refill:  3    Stopping the amlodipine   No orders of the defined types were placed in this encounter.    Follow up plan: Return in about 3 weeks (around 07/01/2014) for fasting labs and BP only; return to see me in early August.   An After Visit Summary was printed and given to the patient.

## 2014-06-10 NOTE — Assessment & Plan Note (Signed)
Last DEXA reviewed; pointed out thoracic kyphosis, risk of fracture; she does not want DEXA scan ordered; three servings of calcium daily; check vit D in 3 weeks and see if supplement needed; fall precautions encouraged

## 2014-06-25 ENCOUNTER — Telehealth: Payer: Self-pay | Admitting: Family Medicine

## 2014-06-25 NOTE — Telephone Encounter (Signed)
I talked with patient; she took 50 mg of losartan at 9 am and then took another at 10 am She cannot check her BP at home Reassurance given, 100 mg is not toxic amount, actually comes in that dose, but we'll bring her in this afternoon to check BP In the meantime, stay hydrated, eat a good lunch and come in around 1:15 or 1:30 for lab / CMA visit to check vitals (orthostatic set)

## 2014-07-01 ENCOUNTER — Encounter: Payer: Self-pay | Admitting: Obstetrics and Gynecology

## 2014-07-01 ENCOUNTER — Ambulatory Visit (INDEPENDENT_AMBULATORY_CARE_PROVIDER_SITE_OTHER): Payer: Medicare PPO | Admitting: Obstetrics and Gynecology

## 2014-07-01 VITALS — BP 121/78 | HR 76 | Ht 65.0 in | Wt 152.5 lb

## 2014-07-01 DIAGNOSIS — Z4689 Encounter for fitting and adjustment of other specified devices: Secondary | ICD-10-CM | POA: Diagnosis not present

## 2014-07-01 DIAGNOSIS — T8389XA Other specified complication of genitourinary prosthetic devices, implants and grafts, initial encounter: Secondary | ICD-10-CM

## 2014-07-01 DIAGNOSIS — N898 Other specified noninflammatory disorders of vagina: Secondary | ICD-10-CM

## 2014-07-01 DIAGNOSIS — N819 Female genital prolapse, unspecified: Secondary | ICD-10-CM

## 2014-07-01 NOTE — Progress Notes (Signed)
Patient ID: Haley Schneider, female   DOB: [birth year], 79 y.o.   MRN: [id number]   GYN ENCOUNTER NOTE  Subjective:       Haley Schneider is a 79 y.o.Marland Kitchenfemale with a history of vaginal prolapse who is here for follow up visit for pessary check. Last visit 04/01/14. Ring with support. Trimo-san Gel used weekly. No vaginal discharge, bleeding, odor, or pain. Normal bowel and bladder function.   Gynecologic History No LMP recorded. Patient is postmenopausal.  Obstetric History OB History  No data available    Past Medical History  Diagnosis Date  . OP (osteoporosis)   . Menopause   . Fibroid, uterine   . Hypercholesteremia   . History of abnormal cervical Pap smear   . Hypothyroid   . Hypertension   . Vaginal vault prolapse   . Vaginal atrophy   . Urethral caruncle   . History of urinary tract infection   . Pessary maintenance     # 5 ring with support  . Female bladder prolapse     Past Surgical History  Procedure Laterality Date  . Hernia repair    . Tonsillectomy    . Thyroidectomy    . Total hip arthroplasty    . Cataract extraction    . Thyroglossal duct cyst      Current Outpatient Prescriptions on File Prior to Visit  Medication Sig Dispense Refill  . aspirin EC 81 MG tablet Take 81 mg by mouth daily.    . B Complex-C-E-Zn (B COMPLEX-C-E-ZINC) tablet Take 1 tablet by mouth daily.    . Biotin 10 MG CAPS Take 1 mg by mouth daily.    Marland Kitchen levothyroxine (SYNTHROID, LEVOTHROID) 50 MCG tablet Take 50 mcg by mouth daily before breakfast.    . losartan (COZAAR) 50 MG tablet Take 1 tablet (50 mg total) by mouth daily. 90 tablet 3  . traMADol (ULTRAM) 50 MG tablet     . vitamin B-12 (CYANOCOBALAMIN) 100 MCG tablet Take 100 mcg by mouth daily.     No current facility-administered medications on file prior to visit.    No Known Allergies  History   Social History  . Marital Status: Widowed    Spouse Name: N/A  . Number of Children: N/A  . Years of Education: N/A    Occupational History  . Not on file.   Social History Main Topics  . Smoking status: Never Smoker   . Smokeless tobacco: Never Used  . Alcohol Use: No  . Drug Use: No  . Sexual Activity: Yes    Birth Control/ Protection: Surgical   Other Topics Concern  . Not on file   Social History Narrative    Family History  Problem Relation Age of Onset  . Alzheimer's disease Mother   . Cancer Father     lung  . Hypertension Father   . Diabetes Son   . Stroke Paternal Grandfather     The following portions of the patient's history were reviewed and updated as appropriate: allergies, current medications, past family history, past medical history, past social history, past surgical history and problem list.  Review of Systems Review of Systems - General ROS: Negative for - chills, fatigue, fever, hot flashes, malaise or night sweats Hematological and Lymphatic ROS: negative for - bleeding problems or swollen lymph nodes Gastrointestinal ROS: negative for - abdominal pain, blood in stools, change in bowel habits and nausea/vomiting Musculoskeletal ROS: negative for - joint pain, muscle pain or muscular weakness  Genito-Urinary ROS: negative for - change in menstrual cycle, dysmenorrhea, dyspareunia, dysuria, genital discharge, genital ulcers, hematuria, incontinence, irregular/heavy menses, nocturia or pelvic pain  Objective:   BP 121/78 mmHg  Pulse 76  Ht 5\' 5"  (1.651 m)  Wt 152 lb 8 oz (69.174 kg)  BMI 25.38 kg/m2 CONSTITUTIONAL: Well-developed, well-nourished female in no acute distress.  HENT:  Normocephalic, atraumatic.  SKIN: Skin is warm and dry. No rash noted. Not diaphoretic. No erythema. No pallor. [place]: Alert and oriented to person, place, and time. PSYCHIATRIC: Normal mood and affect. Normal behavior. Normal judgment and thought content. CARDIOVASCULAR:Not Examined RESPIRATORY: Not Examined BREASTS: Not Examined ABDOMEN: Soft, non distended; Non tender.  No  Organomegaly. PELVIC:  External Genitalia: Normal  BUS: Normal  Vagina: 2x1 cm Right vaginal side wall abrasion  Cervix:  Absent  Uterus: Absent  Adnexa: N/A  RV: N/A  Bladder: Nontender  Procedure: Note: Pessary removed and cleaned. Not replaced to allow for vaginal healing.  Assessment:   1. Vaginal Prolapse 2. Right vaginal side wall abrasion, 2 x 1 cm   Plan:    1. F/U in 3 weeks - return to have pessary re-inserted. 2.  Premarin cream 1/2 g intravaginal biweekly

## 2014-07-01 NOTE — Patient Instructions (Signed)
Premarin cream 1/2 g intravaginal 2 times a week Return in 3 weeks for reassessment and pessary insertion

## 2014-07-03 ENCOUNTER — Other Ambulatory Visit (INDEPENDENT_AMBULATORY_CARE_PROVIDER_SITE_OTHER): Payer: Medicare PPO

## 2014-07-03 DIAGNOSIS — M8580 Other specified disorders of bone density and structure, unspecified site: Secondary | ICD-10-CM | POA: Diagnosis not present

## 2014-07-03 DIAGNOSIS — E890 Postprocedural hypothyroidism: Secondary | ICD-10-CM

## 2014-07-03 DIAGNOSIS — E785 Hyperlipidemia, unspecified: Secondary | ICD-10-CM | POA: Diagnosis not present

## 2014-07-03 DIAGNOSIS — I10 Essential (primary) hypertension: Secondary | ICD-10-CM

## 2014-07-03 NOTE — Progress Notes (Unsigned)
Patient came in for BP check. States she is doing well with Lisinopril 50mg . No issues, no side effects. BP today was 146/78 but patient states that earlier this week at her GYN, it was 130/75.

## 2014-07-04 LAB — COMPREHENSIVE METABOLIC PANEL
A/G RATIO: 1.7 (ref 1.1–2.5)
ALK PHOS: 86 IU/L (ref 39–117)
ALT: 10 IU/L (ref 0–32)
AST: 17 IU/L (ref 0–40)
Albumin: 4 g/dL (ref 3.5–4.8)
BUN/Creatinine Ratio: 19 (ref 11–26)
BUN: 10 mg/dL (ref 8–27)
Bilirubin Total: 0.3 mg/dL (ref 0.0–1.2)
CHLORIDE: 97 mmol/L (ref 97–108)
CO2: 24 mmol/L (ref 18–29)
CREATININE: 0.53 mg/dL — AB (ref 0.57–1.00)
Calcium: 10.4 mg/dL — ABNORMAL HIGH (ref 8.7–10.3)
GFR calc Af Amer: 106 mL/min/{1.73_m2} (ref >59)
GFR calc non Af Amer: 92 mL/min/{1.73_m2} (ref >59)
Globulin, Total: 2.4 g/dL (ref 1.5–4.5)
Glucose: 87 mg/dL (ref 65–99)
POTASSIUM: 4.5 mmol/L (ref 3.5–5.2)
Sodium: 135 mmol/L (ref 134–144)
TOTAL PROTEIN: 6.4 g/dL (ref 6.0–8.5)

## 2014-07-04 LAB — LIPID PANEL
Chol/HDL Ratio: 2.9 ratio units (ref 0.0–4.4)
Cholesterol, Total: 170 mg/dL (ref 100–199)
HDL: 59 mg/dL (ref >39)
LDL CALC: 88 mg/dL (ref 0–99)
TRIGLYCERIDES: 114 mg/dL (ref 0–149)
VLDL Cholesterol Cal: 23 mg/dL (ref 5–40)

## 2014-07-04 LAB — VITAMIN D 25 HYDROXY (VIT D DEFICIENCY, FRACTURES): Vit D, 25-Hydroxy: 32.7 ng/mL (ref 30.0–100.0)

## 2014-07-04 LAB — TSH: TSH: 1.49 u[IU]/mL (ref 0.450–4.500)

## 2014-07-06 ENCOUNTER — Other Ambulatory Visit: Payer: Self-pay | Admitting: Family Medicine

## 2014-07-22 ENCOUNTER — Ambulatory Visit (INDEPENDENT_AMBULATORY_CARE_PROVIDER_SITE_OTHER): Payer: Medicare PPO | Admitting: Obstetrics and Gynecology

## 2014-07-22 ENCOUNTER — Encounter: Payer: Self-pay | Admitting: Obstetrics and Gynecology

## 2014-07-22 VITALS — BP 119/71 | HR 84 | Ht 65.0 in | Wt 153.0 lb

## 2014-07-22 DIAGNOSIS — T8389XA Other specified complication of genitourinary prosthetic devices, implants and grafts, initial encounter: Principal | ICD-10-CM

## 2014-07-22 DIAGNOSIS — N898 Other specified noninflammatory disorders of vagina: Secondary | ICD-10-CM | POA: Diagnosis not present

## 2014-07-22 NOTE — Progress Notes (Signed)
Patient ID: Haley Schneider, female   DOB: 1935/09/03, 79 y.o.   MRN: [id number] 3 week pessary check Pessary has been out since d/t rt side vaginal wall abrasion Using premarin cream used all sample Does not want pessary reinserted until after hip replacement (08/26/2014)  Chief complaint: 1.  Follow-up on vaginal erosion. 2.  Pessary insertion  Patient has left her pessary out for 3 weeks to Allow vaginal erosion to heal. At this time however she wishes to leave the pessary out until her orthopedic surgery is completed and convalescence is finished. She does experience incontinence of urine and is managing this with depends. The patient does not note any vaginal discharge or bleeding.  OBJECTIVE: BP 119/71 mmHg  Pulse 84  Ht 5\' 5"  (1.651 m)  Wt 153 lb (69.4 kg)  BMI 25.46 kg/m2 Pleasant, well-appearing white female needing assistance on and off the exam table. Abdomen soft and nontender. Pelvic exam:  External genitalia normal.  BUS: Normal.  Vagina: Wall prolapse is noted; there is granulation tissue noted on the posterior O lateral vaginal sidewall on the right where the erosion had previously been noted.  There is no evidence of inflammation.  This granulation tissue is slightly friable.  IMPRESSION: 1.  Vaginal erosion healed. 2.  Minimal scar tissue, friable is present; this can be managed post orthopedic surgery.  PLAN: 1.  Leave pessary out until convalescence from orthopedic surgery is complete. 2.  Return when necessary for pessary reinsertion. 3.  Will treat the granulation tissue, if still present, with Monsel's solution.

## 2014-07-31 DIAGNOSIS — M25462 Effusion, left knee: Secondary | ICD-10-CM | POA: Diagnosis not present

## 2014-07-31 DIAGNOSIS — M1711 Unilateral primary osteoarthritis, right knee: Secondary | ICD-10-CM | POA: Diagnosis not present

## 2014-07-31 DIAGNOSIS — M25562 Pain in left knee: Secondary | ICD-10-CM | POA: Diagnosis not present

## 2014-08-11 ENCOUNTER — Encounter: Payer: Self-pay | Admitting: Family Medicine

## 2014-08-11 DIAGNOSIS — Z5181 Encounter for therapeutic drug level monitoring: Secondary | ICD-10-CM | POA: Insufficient documentation

## 2014-08-12 ENCOUNTER — Ambulatory Visit (INDEPENDENT_AMBULATORY_CARE_PROVIDER_SITE_OTHER): Payer: Medicare PPO | Admitting: Family Medicine

## 2014-08-12 ENCOUNTER — Encounter: Payer: Self-pay | Admitting: Family Medicine

## 2014-08-12 VITALS — BP 124/79 | HR 72 | Temp 98.4°F | Wt 152.0 lb

## 2014-08-12 DIAGNOSIS — I10 Essential (primary) hypertension: Secondary | ICD-10-CM

## 2014-08-12 DIAGNOSIS — Z01818 Encounter for other preprocedural examination: Secondary | ICD-10-CM | POA: Diagnosis not present

## 2014-08-12 DIAGNOSIS — R350 Frequency of micturition: Secondary | ICD-10-CM | POA: Insufficient documentation

## 2014-08-12 DIAGNOSIS — E890 Postprocedural hypothyroidism: Secondary | ICD-10-CM

## 2014-08-12 LAB — MICROSCOPIC EXAMINATION

## 2014-08-12 NOTE — Assessment & Plan Note (Signed)
Last TSH in July was normal

## 2014-08-12 NOTE — Assessment & Plan Note (Signed)
EKG today unchanged from Feb 2013; nothing concerning to prevent clearance; will get additional labs and expect to clear her once those labs are back

## 2014-08-12 NOTE — Assessment & Plan Note (Addendum)
Usually well-controlled, 117 and 122 at other doctor's offices; rechecked today after sitting several minutes and now well-controlled; no changes to plan

## 2014-08-12 NOTE — Assessment & Plan Note (Signed)
Check labs today.

## 2014-08-12 NOTE — Patient Instructions (Addendum)
You had labs and urine collected today for testing If you have not heard anything from me or my staff in a week about any lab results from today, please contact us here to follow-up [phone] We should be able to provide Dr. Wynelle Link with clearance to proceed with your surgery after we get your lab results

## 2014-08-12 NOTE — Assessment & Plan Note (Signed)
Check urine today as part of pre-op clearance; she will have pessary replaced after surgery

## 2014-08-12 NOTE — Progress Notes (Signed)
BP 124/79 mmHg  Pulse 72  Temp(Src) 98.4 F (36.9 C)  Wt 152 lb (68.947 kg)  SpO2 96%   Subjective:    Patient ID: Haley Schneider, female    DOB: 1935-09-18, 79 y.o.   MRN: [id number]  HPI: Haley Schneider is a 79 y.o. female  Chief Complaint  Patient presents with  . Pre-op Exam    has hip replacement surgery coming up   Having right hip replacement on August 24th For osteoarthritis; Dr. Wynelle Schneider in [place] In a lot of pain, bone on bone in that right hip No hx of prior surgical complications or anesthesia problems other than nausea No bleeding problems Not limited in her activities due to heart or lung problems No recent illness, no sores She is not aware of having anesthesia work-up  Patient refuses pneumonia vaccine and flu shot  Relevant past medical, surgical, family and social history reviewed and updated as indicated. Interim medical history since our last visit reviewed. Allergies and medications reviewed and updated.  Depression screen PHQ 2/9 08/12/2014  Decreased Interest 0  Down, Depressed, Hopeless 0  PHQ - 2 Score 0    Review of Systems  Constitutional: Positive for fatigue (does get tired at her age). Negative for fever and chills.  HENT: Negative for hearing loss and nosebleeds.   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  Respiratory: Negative for cough and shortness of breath.   Cardiovascular: Negative for chest pain and leg swelling.  Gastrointestinal: Negative for abdominal pain and blood in stool.  Endocrine: Positive for polyuria (has to urinate a lot since having pessary taken out, Dr. Keturah Schneider thought it was best with surgery coming up). Negative for polydipsia.  Genitourinary: Negative for dysuria and hematuria.  Musculoskeletal: Positive for arthralgias (right hip).  Skin: Negative for pallor, rash and wound.  Neurological: Negative for tremors, seizures and headaches.   Hematological: Bruises/bleeds easily (just a little where she bumps herself from the aspirin, where the skin is thin).   Per HPI unless specifically indicated above     Objective:    BP 124/79 mmHg  Pulse 72  Temp(Src) 98.4 F (36.9 C)  Wt 152 lb (68.947 kg)  SpO2 96%  Wt Readings from Last 3 Encounters:  08/12/14 152 lb (68.947 kg)  07/22/14 153 lb (69.4 kg)  07/01/14 152 lb 8 oz (69.174 kg)    Today's Vitals   08/12/14 0922 08/12/14 0955  BP: 154/83 124/79  Pulse: 78 72  Temp: 98.4 F (36.9 C)   Weight: 152 lb (68.947 kg)   SpO2: 96%   PainSc:  9   PainLoc:  Hip   Recheck BP was 124/79   Physical Exam  Constitutional: She appears well-developed and well-nourished. No distress.  HENT:  Head: Normocephalic and atraumatic.  Eyes: EOM are normal. No scleral icterus.  Neck: No thyromegaly present.  Cardiovascular: Normal rate, regular rhythm and normal heart sounds.   No murmur heard. Pulmonary/Chest: Effort normal and breath sounds normal. No respiratory distress. She has no wheezes.  Thoracic kyphosis  Abdominal: Soft. Bowel sounds are normal. She exhibits no distension.  Musculoskeletal: She exhibits no edema.       Right hip: She exhibits decreased range of motion and decreased strength.       Left knee: She exhibits deformity (bony enlargement). She exhibits no effusion.  Ambulatory with cane  Neurological: She is alert. She exhibits normal muscle tone.  Skin: Skin is warm and dry. She is not diaphoretic. No pallor.  Psychiatric: She has a normal mood and affect. Her speech is normal and behavior is normal. Judgment and thought content normal. Cognition and memory are normal.   Results for orders placed or performed in visit on 07/03/14  Lipid panel  Result Value Ref Range   Cholesterol, Total 170 100 - 199 mg/dL   Triglycerides 114 0 - 149 mg/dL   HDL 59 >39 mg/dL   VLDL Cholesterol Cal 23 5 - 40 mg/dL   LDL Calculated 88 0 - 99 mg/dL   Chol/HDL Ratio  2.9 0.0 - 4.4 ratio units  TSH  Result Value Ref Range   TSH 1.490 0.450 - 4.500 uIU/mL  Comp Met (CMET)  Result Value Ref Range   Glucose 87 65 - 99 mg/dL   BUN 10 8 - 27 mg/dL   Creatinine, Ser 0.53 (L) 0.57 - 1.00 mg/dL   GFR calc non Af Amer 92 >59 mL/min/1.73   GFR calc Af Amer 106 >59 mL/min/1.73   BUN/Creatinine Ratio 19 11 - 26   Sodium 135 134 - 144 mmol/L   Potassium 4.5 3.5 - 5.2 mmol/L   Chloride 97 97 - 108 mmol/L   CO2 24 18 - 29 mmol/L   Calcium 10.4 (H) 8.7 - 10.3 mg/dL   Total Protein 6.4 6.0 - 8.5 g/dL   Albumin 4.0 3.5 - 4.8 g/dL   Globulin, Total 2.4 1.5 - 4.5 g/dL   Albumin/Globulin Ratio 1.7 1.1 - 2.5   Bilirubin Total 0.3 0.0 - 1.2 mg/dL   Alkaline Phosphatase 86 39 - 117 IU/L   AST 17 0 - 40 IU/L   ALT 10 0 - 32 IU/L  Vitamin D (25 hydroxy)  Result Value Ref Range   Vit D, 25-Hydroxy 32.7 30.0 - 100.0 ng/mL      Assessment & Plan:   Problem List Items Addressed This Visit      Cardiovascular and Mediastinum   Hypertension    Usually well-controlled, 117 and 122 at other doctor's offices; rechecked today after sitting several minutes and now well-controlled; no changes to plan        Endocrine   Hypothyroidism    Last TSH in July was normal        Other   Hypercalcemia    Check labs today      Relevant Orders   Basic Metabolic Panel (BMET)   Phosphorus   Pre-operative clearance - Primary    EKG today unchanged from Feb 2013; nothing concerning to prevent clearance; will get additional labs and expect to clear her once those labs are back      Relevant Orders   CBC With Differential   PT and PTT   EKG 12-Lead (Completed)   Urinary frequency    Check urine today as part of pre-op clearance; she will have pessary replaced after surgery      Relevant Orders   UA/M w/rflx Culture, Routine      Follow up plan: Return if symptoms worsen or fail to improve.  Orders Placed This Encounter  Procedures  . UA/M w/rflx Culture,  Routine  . CBC With Differential  . PT and PTT  . Basic Metabolic Panel (BMET)  . Phosphorus  . EKG 12-Lead

## 2014-08-13 LAB — BASIC METABOLIC PANEL
BUN / CREAT RATIO: 20 (ref 11–26)
BUN: 11 mg/dL (ref 8–27)
CHLORIDE: 94 mmol/L — AB (ref 97–108)
CO2: 25 mmol/L (ref 18–29)
Calcium: 10.1 mg/dL (ref 8.7–10.3)
Creatinine, Ser: 0.56 mg/dL — ABNORMAL LOW (ref 0.57–1.00)
GFR calc Af Amer: 104 mL/min/{1.73_m2} (ref >59)
GFR calc non Af Amer: 90 mL/min/{1.73_m2} (ref >59)
Glucose: 90 mg/dL (ref 65–99)
Potassium: 4.4 mmol/L (ref 3.5–5.2)
Sodium: 132 mmol/L — ABNORMAL LOW (ref 134–144)

## 2014-08-13 LAB — CBC WITH DIFFERENTIAL
Basophils Absolute: 0 10*3/uL (ref 0.0–0.2)
Basos: 1 %
EOS (ABSOLUTE): 0.1 10*3/uL (ref 0.0–0.4)
EOS: 1 %
HEMATOCRIT: 40.8 % (ref 34.0–46.6)
Hemoglobin: 13.9 g/dL (ref 11.1–15.9)
Immature Grans (Abs): 0 10*3/uL (ref 0.0–0.1)
Immature Granulocytes: 0 %
Lymphocytes Absolute: 2.1 10*3/uL (ref 0.7–3.1)
Lymphs: 24 %
MCH: 28.8 pg (ref 26.6–33.0)
MCHC: 34.1 g/dL (ref 31.5–35.7)
MCV: 85 fL (ref 79–97)
MONOS ABS: 0.8 10*3/uL (ref 0.1–0.9)
Monocytes: 9 %
NEUTROS PCT: 65 %
Neutrophils Absolute: 5.6 10*3/uL (ref 1.4–7.0)
RBC: 4.82 x10E6/uL (ref 3.77–5.28)
RDW: 14 % (ref 12.3–15.4)
WBC: 8.6 10*3/uL (ref 3.4–10.8)

## 2014-08-13 LAB — UA/M W/RFLX CULTURE, ROUTINE: Organism ID, Bacteria: NO GROWTH

## 2014-08-13 LAB — PT AND PTT
APTT: 26 s (ref 24–33)
INR: 1 (ref 0.8–1.2)
PROTHROMBIN TIME: 10.4 s (ref 9.1–12.0)

## 2014-08-13 LAB — PHOSPHORUS: Phosphorus: 2.7 mg/dL (ref 2.5–4.5)

## 2014-08-17 ENCOUNTER — Ambulatory Visit: Payer: Self-pay | Admitting: Orthopedic Surgery

## 2014-08-17 NOTE — Progress Notes (Signed)
Preoperative surgical orders have been place into the Epic hospital system for Haley Schneider on 08/17/2014, 12:10 PM  by Mickel Crow for surgery on 08/26/2014.  Preop Total Hip - Anterior Approach orders including IV Tylenol, and IV Decadron as long as there are no contraindications to the above medications. Arlee Muslim, PA-C

## 2014-08-19 ENCOUNTER — Telehealth: Payer: Self-pay | Admitting: Family Medicine

## 2014-08-19 NOTE — Telephone Encounter (Signed)
Patient notified.  Note faxed to Dr. Wynelle Link.

## 2014-08-19 NOTE — Telephone Encounter (Signed)
Pt called and would like to get results from labs she had drawn.

## 2014-08-19 NOTE — Telephone Encounter (Signed)
Patient is cleared medically for surgery Please notify patient and surgeon

## 2014-08-19 NOTE — Telephone Encounter (Signed)
Patient wanted results, advised her of them. She basically wanted to make sure [facility] could see the results for her pre op labs. I advised patient they could since we were on the same system.

## 2014-08-20 ENCOUNTER — Encounter (HOSPITAL_COMMUNITY)
Admission: RE | Admit: 2014-08-20 | Discharge: 2014-08-20 | Disposition: A | Discharge status: Home / Self Care | Payer: Medicare PPO | Source: Ambulatory Visit | Attending: Orthopedic Surgery | Admitting: Orthopedic Surgery

## 2014-08-20 ENCOUNTER — Encounter (HOSPITAL_COMMUNITY): Payer: Self-pay

## 2014-08-20 DIAGNOSIS — M1611 Unilateral primary osteoarthritis, right hip: Secondary | ICD-10-CM | POA: Diagnosis not present

## 2014-08-20 DIAGNOSIS — Z01818 Encounter for other preprocedural examination: Secondary | ICD-10-CM | POA: Diagnosis not present

## 2014-08-20 HISTORY — DX: Urgency of urination: R39.15

## 2014-08-20 HISTORY — DX: Gastro-esophageal reflux disease without esophagitis: K21.9

## 2014-08-20 HISTORY — DX: Unspecified osteoarthritis, unspecified site: M19.90

## 2014-08-20 HISTORY — DX: Other specified postprocedural states: R11.2

## 2014-08-20 HISTORY — DX: Other specified postprocedural states: Z98.890

## 2014-08-20 HISTORY — DX: Nocturia: R35.1

## 2014-08-20 HISTORY — DX: Unspecified urinary incontinence: R32

## 2014-08-20 HISTORY — DX: Personal history of urinary (tract) infections: Z87.440

## 2014-08-20 LAB — URINALYSIS, ROUTINE W REFLEX MICROSCOPIC
Bilirubin Urine: NEGATIVE
Glucose, UA: NEGATIVE mg/dL
Hgb urine dipstick: NEGATIVE
KETONES UR: NEGATIVE mg/dL
Nitrite: NEGATIVE
PROTEIN: NEGATIVE mg/dL
Specific Gravity, Urine: 1.01 (ref 1.005–1.030)
UROBILINOGEN UA: 0.2 mg/dL (ref 0.0–1.0)
pH: 6.5 (ref 5.0–8.0)

## 2014-08-20 LAB — URINE MICROSCOPIC-ADD ON

## 2014-08-20 LAB — COMPREHENSIVE METABOLIC PANEL
ALT: 13 U/L — ABNORMAL LOW (ref 14–54)
AST: 17 U/L (ref 15–41)
Albumin: 4 g/dL (ref 3.5–5.0)
Alkaline Phosphatase: 84 U/L (ref 38–126)
Anion gap: 6 (ref 5–15)
BILIRUBIN TOTAL: 0.6 mg/dL (ref 0.3–1.2)
BUN: 10 mg/dL (ref 6–20)
CHLORIDE: 100 mmol/L — AB (ref 101–111)
CO2: 29 mmol/L (ref 22–32)
Calcium: 10.2 mg/dL (ref 8.9–10.3)
Creatinine, Ser: 0.58 mg/dL (ref 0.44–1.00)
GFR calc Af Amer: >60 mL/min (ref >60)
GFR calc non Af Amer: >60 mL/min (ref >60)
Glucose, Bld: 89 mg/dL (ref 65–99)
Potassium: 4.1 mmol/L (ref 3.5–5.1)
Sodium: 135 mmol/L (ref 135–145)
Total Protein: 6.6 g/dL (ref 6.5–8.1)

## 2014-08-20 LAB — SURGICAL PCR SCREEN
MRSA, PCR: NEGATIVE
STAPHYLOCOCCUS AUREUS: POSITIVE — AB

## 2014-08-20 NOTE — Progress Notes (Addendum)
EKG/epic 08/12/2014 [facility], PT and PTT results/epic 08/20/2014  Clearance note per chart per Dr Sanda Klein 08/19/2014

## 2014-08-20 NOTE — Patient Instructions (Signed)
Haley Schneider  08/20/2014   Your procedure is scheduled on: Wednesday August 26, 2014   Report to [facility]  Entrance take [place]  elevators to 3rd floor to  [place] at 5:15 AM.  Call this number if you have problems the morning of surgery [phone]   Remember: ONLY 1 PERSON MAY GO WITH YOU TO SHORT STAY TO GET  READY MORNING OF [place].  Do not eat food or drink liquids :After Midnight.     Take these medicines the morning of surgery with A SIP OF WATER: Levothyroxine                                You may not have any metal on your body including hair pins and              piercings  Do not wear jewelry, make-up, lotions, powders or perfumes, deodorant             Do not wear nail polish.  Do not shave  48 hours prior to surgery.                Do not bring valuables to the hospital. [place].  Contacts, dentures or bridgework may not be worn into surgery.  Leave suitcase in the car. After surgery it may be brought to your room.                  Please read over the following fact sheets you were given:MRSA INFORMATION SHEET; INCENTIVE SPIROMETER; BLOOD TRANSFUSION INFORMATION SHEET  _____________________________________________________________________             [facility] - Preparing for Surgery Before surgery, you can play an important role.  Because skin is not sterile, your skin needs to be as free of germs as possible.  You can reduce the number of germs on your skin by washing with CHG (chlorahexidine gluconate) soap before surgery.  CHG is an antiseptic cleaner which kills germs and bonds with the skin to continue killing germs even after washing. Please DO NOT use if you have an allergy to CHG or antibacterial soaps.  If your skin becomes reddened/irritated stop using the CHG and inform your nurse when you arrive at Short Stay. Do not shave (including legs and underarms)  for at least 48 hours prior to the first CHG shower.  You may shave your face/neck. Please follow these instructions carefully:  1.  Shower with CHG Soap the night before surgery and the  morning of Surgery.  2.  If you choose to wash your hair, wash your hair first as usual with your  normal  shampoo.  3.  After you shampoo, rinse your hair and body thoroughly to remove the  shampoo.                           4.  Use CHG as you would any other liquid soap.  You can apply chg directly  to the skin and wash                       Gently with a scrungie or clean washcloth.  5.  Apply the CHG Soap to your body ONLY FROM THE NECK DOWN.   Do not use on face/ open                           Wound or open sores. Avoid contact with eyes, ears mouth and genitals (private parts).                       Wash face,  Genitals (private parts) with your normal soap.             6.  Wash thoroughly, paying special attention to the area where your surgery  will be performed.  7.  Thoroughly rinse your body with warm water from the neck down.  8.  DO NOT shower/wash with your normal soap after using and rinsing off  the CHG Soap.                9.  Pat yourself dry with a clean towel.            10.  Wear clean pajamas.            11.  Place clean sheets on your bed the night of your first shower and do not  sleep with pets. Day of Surgery : Do not apply any lotions/deodorants the morning of surgery.  Please wear clean clothes to the hospital/surgery center.  FAILURE TO FOLLOW THESE INSTRUCTIONS MAY RESULT IN THE CANCELLATION OF YOUR SURGERY PATIENT SIGNATURE_________________________________  NURSE SIGNATURE__________________________________  ________________________________________________________________________   Adam Phenix  An incentive spirometer is a tool that can help keep your lungs clear and active. This tool measures how well you are filling your lungs with each breath. Taking long deep  breaths may help reverse or decrease the chance of developing breathing (pulmonary) problems (especially infection) following:  A long period of time when you are unable to move or be active. BEFORE THE PROCEDURE   If the spirometer includes an indicator to show your best effort, your nurse or respiratory therapist will set it to a desired goal.  If possible, sit up straight or lean slightly forward. Try not to slouch.  Hold the incentive spirometer in an upright position. INSTRUCTIONS FOR USE   Sit on the edge of your bed if possible, or sit up as far as you can in bed or on a chair.  Hold the incentive spirometer in an upright position.  Breathe out normally.  Place the mouthpiece in your mouth and seal your lips tightly around it.  Breathe in slowly and as deeply as possible, raising the piston or the ball toward the top of the column.  Hold your breath for 3-5 seconds or for as long as possible. Allow the piston or ball to fall to the bottom of the column.  Remove the mouthpiece from your mouth and breathe out normally.  Rest for a few seconds and repeat Steps 1 through 7 at least 10 times every 1-2 hours when you are awake. Take your time and take a few normal breaths between deep breaths.  The spirometer may include an indicator to show your best effort. Use the indicator as a goal to work toward during each repetition.  After each set of 10 deep breaths, practice coughing to be sure your lungs are clear. If you have an incision (the cut made at the time of surgery), support your incision when coughing by placing a pillow or  rolled up towels firmly against it. Once you are able to get out of bed, walk around indoors and cough well. You may stop using the incentive spirometer when instructed by your caregiver.  RISKS AND COMPLICATIONS  Take your time so you do not get dizzy or light-headed.  If you are in pain, you may need to take or ask for pain medication before doing  incentive spirometry. It is harder to take a deep breath if you are having pain. AFTER USE  Rest and breathe slowly and easily.  It can be helpful to keep track of a log of your progress. Your caregiver can provide you with a simple table to help with this. If you are using the spirometer at home, follow these instructions: [place] IF:   You are having difficultly using the spirometer.  You have trouble using the spirometer as often as instructed.  Your pain medication is not giving enough relief while using the spirometer.  You develop fever of 100.5 F (38.1 C) or higher. SEEK IMMEDIATE MEDICAL CARE IF:   You cough up bloody sputum that had not been present before.  You develop fever of 102 F (38.9 C) or greater.  You develop worsening pain at or near the incision site. MAKE SURE YOU:   Understand these instructions.  Will watch your condition.  Will get help right away if you are not doing well or get worse. Document Released: 05/01/2006 Document Revised: 03/13/2011 Document Reviewed: 07/02/2006 ExitCare Patient Information 2014 ExitCare, [place].   ________________________________________________________________________  WHAT IS A BLOOD TRANSFUSION? Blood Transfusion Information  A transfusion is the replacement of blood or some of its parts. Blood is made up of multiple cells which provide different functions.  Red blood cells carry oxygen and are used for blood loss replacement.  White blood cells fight against infection.  Platelets control bleeding.  Plasma helps clot blood.  Other blood products are available for specialized needs, such as hemophilia or other clotting disorders. BEFORE THE TRANSFUSION  Who gives blood for transfusions?   Healthy volunteers who are fully evaluated to make sure their blood is safe. This is blood bank blood. Transfusion therapy is the safest it has ever been in the practice of medicine. Before blood is taken from a  donor, a complete history is taken to make sure that person has no history of diseases nor engages in risky social behavior (examples are intravenous drug use or sexual activity with multiple partners). The donor's travel history is screened to minimize risk of transmitting infections, such as malaria. The donated blood is tested for signs of infectious diseases, such as HIV and hepatitis. The blood is then tested to be sure it is compatible with you in order to minimize the chance of a transfusion reaction. If you or a relative donates blood, this is often done in anticipation of surgery and is not appropriate for emergency situations. It takes many days to process the donated blood. RISKS AND COMPLICATIONS Although transfusion therapy is very safe and saves many lives, the main dangers of transfusion include:   Getting an infectious disease.  Developing a transfusion reaction. This is an allergic reaction to something in the blood you were given. Every precaution is taken to prevent this. The decision to have a blood transfusion has been considered carefully by your caregiver before blood is given. Blood is not given unless the benefits outweigh the risks. AFTER THE TRANSFUSION  Right after receiving a blood transfusion, you will usually  feel much better and more energetic. This is especially true if your red blood cells have gotten low (anemic). The transfusion raises the level of the red blood cells which carry oxygen, and this usually causes an energy increase.  The nurse administering the transfusion will monitor you carefully for complications. HOME CARE INSTRUCTIONS  No special instructions are needed after a transfusion. You may find your energy is better. Speak with your caregiver about any limitations on activity for underlying diseases you may have. SEEK MEDICAL CARE IF:   Your condition is not improving after your transfusion.  You develop redness or irritation at the intravenous (IV)  site. SEEK IMMEDIATE MEDICAL CARE IF:  Any of the following symptoms occur over the next 12 hours:  Shaking chills.  You have a temperature by mouth above 102 F (38.9 C), not controlled by medicine.  Chest, back, or muscle pain.  People around you feel you are not acting correctly or are confused.  Shortness of breath or difficulty breathing.  Dizziness and fainting.  You get a rash or develop hives.  You have a decrease in urine output.  Your urine turns a dark color or changes to pink, red, or brown. Any of the following symptoms occur over the next 10 days:  You have a temperature by mouth above 102 F (38.9 C), not controlled by medicine.  Shortness of breath.  Weakness after normal activity.  The white part of the eye turns yellow (jaundice).  You have a decrease in the amount of urine or are urinating less often.  Your urine turns a dark color or changes to pink, red, or brown. Document Released: 12/17/1999 Document Revised: 03/13/2011 Document Reviewed: 08/05/2007 [facility] Patient Information 2014 [place], [place].  _______________________________________________________________________

## 2014-08-20 NOTE — Progress Notes (Signed)
Surgical screening results per epic per PAT visit 08/20/2014 positive for STAPH. Prescription for Mupriocin Ointment called to [facility] spoke with Amy - pharmacist. Lab results sent to Dr Wynelle Link. Pt aware. Reviewed instructions.

## 2014-08-23 ENCOUNTER — Ambulatory Visit: Payer: Self-pay | Admitting: Orthopedic Surgery

## 2014-08-23 NOTE — H&P (Signed)
[name] DOB: February 10, 1935 Widowed / Language: Cleophus Molt / Race: White Female Date of Admission:  08/26/2014 CC:  Right Hip Pain History of Present Illness  The patient is a 79 year old female who comes in for a preoperative History and Physical. The patient is scheduled for a right total hip arthroplasty (anterior) to be performed by Dr. Dione Plover. Aluisio, MD at [facility] on 08/26/2014. The patient is being followed for their right hip pain and osteoarthritis. They are weeks out from an I-A injection. Symptoms reported include: pain. The patient feels that they are doing poorly. Patient states that the injection did not help her hip. Pain is in her groin, anterior thigh, radiating to her right knee. Intraarticular injection did not provide much benefit. She is losing more mobility in her right hip. She says this hip feels as bad as the left one did prior to when she had that fixed. She is ready to proceed with surgery. They have been treated conservatively in the past for the above stated problem and despite conservative measures, they continue to have progressive pain and severe functional limitations and dysfunction. They have failed non-operative management including home exercise, medications, and injections. It is felt that they would benefit from undergoing total joint replacement. Risks and benefits of the procedure have been discussed with the patient and they elect to proceed with surgery. There are no active contraindications to surgery such as ongoing infection or rapidly progressive neurological disease.  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 Hypertension Father. Cancer Father.  Social History Current work status retired Games developer weekly; does other Living situation live alone Children 2 Tobacco use Never smoker. 03/18/2014 Not under pain contract Marital status widowed Never consumed alcohol 03/18/2014: Never consumed alcohol No history of drug/alcohol rehab  Medication History Synthroid (50MCG Tablet, Oral) Active. Raloxifene HCl (60MG  Tablet, Oral) Active. Vitamin B-12 (1000MCG Tablet, 5000 Oral daily) Active. Magnesium (Oral) Specific dose unknown - Active. (with zinc) Lutein (20MG  Tablet, Oral) Active. Vitamin A & D (Oral) Active. Vitamin D-3 (1000UNIT Capsule, Oral) Active. Biotin (5000MCG Tablet, 10,000 Oral) Active. Vitamin C ER (1000MG  Tablet ER, Oral) Active. Omega-3 Krill Oil (3x Oral weekly) Specific dose unknown - Active. Aspirin (81MG  Tablet, Oral occasionally) Active. Losartan Potassium (Oral) Specific dose unknown - Active. TraMADol HCl (50MG  Tablet, 1-2 Tablet Oral every 6-8 hours as needed for pain, Taken starting 04/28/2014) Active.  Past Surgical History Thyroidectomy; Subtotal Tonsillectomy Total Hip Replacement left Cataract Surgery bilateral Inguinal Hernia Repair open: left   Review of Systems General Not Present- Chills, Fatigue, Fever, Memory Loss, Night Sweats, Weight Gain and Weight Loss. Skin Not Present- Eczema, Hives, Itching, Lesions and Rash. HEENT Not Present- Dentures, Double Vision, Headache, Hearing Loss, Tinnitus and Visual Loss. Respiratory Not Present- Allergies, Chronic Cough, Coughing up blood, Shortness of breath at rest and Shortness of breath with exertion. Cardiovascular Not Present- Chest Pain, Difficulty Breathing Lying Down, Murmur, Palpitations, Racing/skipping heartbeats and Swelling. Gastrointestinal Not Present- Abdominal Pain, Bloody Stool, Constipation, Diarrhea, Difficulty Swallowing, Heartburn, Jaundice, Loss  of appetitie, Nausea and Vomiting. Female Genitourinary Not Present- Blood in Urine, Discharge, Flank Pain, Incontinence, Painful Urination, Urgency, Urinary frequency, Urinary Retention, Urinating at Night and Weak urinary stream. Musculoskeletal Present- Joint Pain. Not  Present- Back Pain, Joint Swelling, Morning Stiffness, Muscle Pain, Muscle Weakness and Spasms. Neurological Not Present- Blackout spells, Difficulty with balance, Dizziness, Paralysis, Tremor and Weakness. Psychiatric Not Present- Insomnia.  Vitals  Weight: 152 lb Height: 65.5in Weight was reported by patient. Height was reported by patient. Body Surface Area: 1.77 m Body Mass Index: 24.91 kg/m  BP: 122/70 (Sitting, Right Arm, Standard)   Physical Exam General Mental Status -Alert, cooperative and good historian. General Appearance-pleasant, Not in acute distress. Orientation-Oriented X3. Build & Nutrition-Well nourished and Well developed.  Head and Neck Head-normocephalic, atraumatic . Neck Global Assessment - supple, no bruit auscultated on the right, no bruit auscultated on the left.  Eye Pupil - Bilateral-Regular and Round. Motion - Bilateral-EOMI.  Chest and Lung Exam Auscultation Breath sounds - clear at anterior chest wall and clear at posterior chest wall. Adventitious sounds - No Adventitious sounds.  Cardiovascular Auscultation Rhythm - Regular rate and rhythm. Heart Sounds - S1 WNL and S2 WNL. Murmurs & Other Heart Sounds - Auscultation of the heart reveals - No Murmurs.  Abdomen Palpation/Percussion Tenderness - Abdomen is non-tender to palpation. Rigidity (guarding) - Abdomen is soft. Auscultation Auscultation of the abdomen reveals - Bowel sounds normal.  Female Genitourinary Note: Not done, not pertinent to present illness   Musculoskeletal Note: On exam, she is in no distress. Her right hip can be flexed to about 90, minimal internal rotation with  reproduction of her knee pain when I internally rotate her hip. She only has about 10 to 20 degrees of external rotation, 20 degrees of abduction. There is no pain on range of motion of her left hip.  Left Lower Extremity: Left Knee: Inspection and Palpation - Tenderness - lateral knee tender to palpation and medial knee tender to palpation(more on the medial side). Effusion - moderate. Sensation - normal. Other characteristics - no ecchymosis, no erythema. ROM: Flexion - AROM - 120 . PROM - 125 . Extension - AROM - 5 . PROM - 0 . Gait and Station: Evaluation of Gait - Gait Pattern - slight limp. Gait and Station - [employer] - cane. Weightbearing - weightbearing as tolerated.  RADIOGRAPHS Her radiographs are reviewed, AP pelvis and lateral of the right hip. She has got advanced end stage arthritis of the right hip. Bone on bone changes with large subchondral cyst.   Assessment & Plan Primary osteoarthritis of right knee (M17.11) Note:Surgical Plans: Right Total Hip Replacement - Anterior Approach  Disposition: Home  PCP: Dr. Sanda Klein at [place], [place]  IV TXA  Anesthesia Issues: None  Signed electronically by Joelene Millin, III PA-C

## 2014-08-25 ENCOUNTER — Encounter (HOSPITAL_COMMUNITY): Payer: Self-pay | Admitting: Anesthesiology

## 2014-08-25 NOTE — Anesthesia Preprocedure Evaluation (Addendum)
Anesthesia Evaluation  Patient identified by MRN, date of birth, ID band Patient awake    Reviewed: Allergy & Precautions, NPO status , Patient's Chart, lab work & pertinent test results  History of Anesthesia Complications (+) PONV and history of anesthetic complications  Airway Mallampati: II  TM Distance: >3 FB Neck ROM: Full    Dental no notable dental hx.    Pulmonary neg pulmonary ROS,  breath sounds clear to auscultation  Pulmonary exam normal       Cardiovascular Exercise Tolerance: Good hypertension, Pt. on medications Normal cardiovascular examRhythm:Regular Rate:Normal     Neuro/Psych negative neurological ROS  negative psych ROS   GI/Hepatic negative GI ROS, Neg liver ROS, GERD-  Medicated,  Endo/Other  Hypothyroidism   Renal/GU negative Renal ROS  negative genitourinary   Musculoskeletal  (+) Arthritis -,   Abdominal   Peds negative pediatric ROS (+)  Hematology negative hematology ROS (+)   Anesthesia Other Findings   Reproductive/Obstetrics negative OB ROS                           Anesthesia Physical Anesthesia Plan  ASA: II  Anesthesia Plan: Spinal   Post-op Pain Management:    Induction: Intravenous  Airway Management Planned: Natural Airway  Additional Equipment:   Intra-op Plan:   Post-operative Plan:   Informed Consent: I have reviewed the patients History and Physical, chart, labs and discussed the procedure including the risks, benefits and alternatives for the proposed anesthesia with the patient or authorized representative who has indicated his/her understanding and acceptance.   Dental advisory given  Plan Discussed with: CRNA  Anesthesia Plan Comments: (Discussed risks and benefits of and differences between spinal and general. Discussed risks of spinal including headache, backache, failure, bleeding and hematoma, infection, and nerve damage  and paralysis. Patient consents to spinal. Questions answered. Coagulation studies and platelet count acceptable. )       Anesthesia Quick Evaluation

## 2014-08-26 ENCOUNTER — Inpatient Hospital Stay (HOSPITAL_COMMUNITY)
Admission: RE | Admit: 2014-08-26 | Discharge: 2014-08-28 | DRG: 470 | Disposition: A | Discharge status: Home Health | Payer: Medicare PPO | Source: Ambulatory Visit | Attending: Orthopedic Surgery | Admitting: Orthopedic Surgery

## 2014-08-26 ENCOUNTER — Inpatient Hospital Stay (HOSPITAL_COMMUNITY): Payer: Medicare PPO

## 2014-08-26 ENCOUNTER — Inpatient Hospital Stay (HOSPITAL_COMMUNITY): Payer: Medicare PPO | Admitting: Anesthesiology

## 2014-08-26 ENCOUNTER — Encounter (HOSPITAL_COMMUNITY)
Admission: RE | Disposition: A | Discharge status: Home Health | Payer: Self-pay | Source: Ambulatory Visit | Attending: Orthopedic Surgery

## 2014-08-26 ENCOUNTER — Encounter (HOSPITAL_COMMUNITY): Payer: Self-pay | Admitting: *Deleted

## 2014-08-26 DIAGNOSIS — M1711 Unilateral primary osteoarthritis, right knee: Secondary | ICD-10-CM | POA: Diagnosis present

## 2014-08-26 DIAGNOSIS — M1611 Unilateral primary osteoarthritis, right hip: Secondary | ICD-10-CM | POA: Diagnosis not present

## 2014-08-26 DIAGNOSIS — E039 Hypothyroidism, unspecified: Secondary | ICD-10-CM | POA: Diagnosis present

## 2014-08-26 DIAGNOSIS — Z96649 Presence of unspecified artificial hip joint: Secondary | ICD-10-CM

## 2014-08-26 DIAGNOSIS — Z79899 Other long term (current) drug therapy: Secondary | ICD-10-CM | POA: Diagnosis not present

## 2014-08-26 DIAGNOSIS — M169 Osteoarthritis of hip, unspecified: Secondary | ICD-10-CM | POA: Diagnosis present

## 2014-08-26 DIAGNOSIS — K219 Gastro-esophageal reflux disease without esophagitis: Secondary | ICD-10-CM | POA: Diagnosis not present

## 2014-08-26 DIAGNOSIS — M810 Age-related osteoporosis without current pathological fracture: Secondary | ICD-10-CM | POA: Diagnosis present

## 2014-08-26 DIAGNOSIS — Z96642 Presence of left artificial hip joint: Secondary | ICD-10-CM | POA: Diagnosis not present

## 2014-08-26 DIAGNOSIS — I10 Essential (primary) hypertension: Secondary | ICD-10-CM | POA: Diagnosis not present

## 2014-08-26 DIAGNOSIS — Z7982 Long term (current) use of aspirin: Secondary | ICD-10-CM

## 2014-08-26 DIAGNOSIS — E780 Pure hypercholesterolemia: Secondary | ICD-10-CM | POA: Diagnosis present

## 2014-08-26 DIAGNOSIS — Z96641 Presence of right artificial hip joint: Secondary | ICD-10-CM | POA: Diagnosis not present

## 2014-08-26 DIAGNOSIS — Z471 Aftercare following joint replacement surgery: Secondary | ICD-10-CM | POA: Diagnosis not present

## 2014-08-26 HISTORY — PX: TOTAL HIP ARTHROPLASTY: SHX124

## 2014-08-26 LAB — TYPE AND SCREEN
ABO/RH(D): O NEG
Antibody Screen: NEGATIVE

## 2014-08-26 SURGERY — ARTHROPLASTY, HIP, TOTAL, ANTERIOR APPROACH
Anesthesia: Spinal | Site: Hip | Laterality: Right

## 2014-08-26 MED ORDER — ACETAMINOPHEN 325 MG PO TABS: 650.0000 mg | ORAL_TABLET | Freq: Four times a day (QID) | ORAL | Status: DC | PRN

## 2014-08-26 MED ORDER — PROPOFOL INFUSION 10 MG/ML OPTIME
INTRAVENOUS | Status: DC | PRN
  Administered 2014-08-26: 100 ug/kg/min via INTRAVENOUS

## 2014-08-26 MED ORDER — DEXAMETHASONE SODIUM PHOSPHATE 10 MG/ML IJ SOLN
10.0000 mg | Freq: Once | INTRAMUSCULAR | Status: AC
  Administered 2014-08-27: 10 mg via INTRAVENOUS
  Filled 2014-08-26: qty 1

## 2014-08-26 MED ORDER — MORPHINE SULFATE (PF) 2 MG/ML IV SOLN
1.0000 mg | INTRAVENOUS | Status: DC | PRN
  Administered 2014-08-26 – 2014-08-27 (×2): 1 mg via INTRAVENOUS
  Filled 2014-08-26 (×2): qty 1

## 2014-08-26 MED ORDER — CEFAZOLIN SODIUM-DEXTROSE 2-3 GM-% IV SOLR
2.0000 g | Freq: Four times a day (QID) | INTRAVENOUS | Status: AC
  Administered 2014-08-26 (×2): 2 g via INTRAVENOUS
  Filled 2014-08-26 (×2): qty 50

## 2014-08-26 MED ORDER — SODIUM CHLORIDE 0.9 % IV SOLN: INTRAVENOUS | Status: DC

## 2014-08-26 MED ORDER — OXYCODONE HCL 5 MG PO TABS
5.0000 mg | ORAL_TABLET | ORAL | Status: DC | PRN
  Administered 2014-08-26 – 2014-08-27 (×6): 10 mg via ORAL
  Administered 2014-08-27: 5 mg via ORAL
  Administered 2014-08-28 (×4): 10 mg via ORAL
  Filled 2014-08-26: qty 1
  Filled 2014-08-26 (×6): qty 2
  Filled 2014-08-26: qty 1
  Filled 2014-08-26 (×2): qty 2
  Filled 2014-08-26: qty 1
  Filled 2014-08-26: qty 2

## 2014-08-26 MED ORDER — METHOCARBAMOL 1000 MG/10ML IJ SOLN
500.0000 mg | Freq: Four times a day (QID) | INTRAVENOUS | Status: DC | PRN
  Administered 2014-08-26 (×2): 500 mg via INTRAVENOUS
  Filled 2014-08-26 (×3): qty 5

## 2014-08-26 MED ORDER — MIDAZOLAM HCL 5 MG/5ML IJ SOLN
INTRAMUSCULAR | Status: DC | PRN
  Administered 2014-08-26 (×2): 1 mg via INTRAVENOUS

## 2014-08-26 MED ORDER — LOSARTAN POTASSIUM 50 MG PO TABS
50.0000 mg | ORAL_TABLET | Freq: Every day | ORAL | Status: DC
  Administered 2014-08-27 – 2014-08-28 (×2): 50 mg via ORAL
  Filled 2014-08-26 (×2): qty 1

## 2014-08-26 MED ORDER — ONDANSETRON HCL 4 MG/2ML IJ SOLN: 4.0000 mg | Freq: Four times a day (QID) | INTRAMUSCULAR | Status: DC | PRN

## 2014-08-26 MED ORDER — RIVAROXABAN 10 MG PO TABS
10.0000 mg | ORAL_TABLET | Freq: Every day | ORAL | Status: DC
  Administered 2014-08-27 – 2014-08-28 (×2): 10 mg via ORAL
  Filled 2014-08-26 (×3): qty 1

## 2014-08-26 MED ORDER — PROPOFOL 10 MG/ML IV BOLUS
INTRAVENOUS | Status: AC
  Filled 2014-08-26: qty 20

## 2014-08-26 MED ORDER — EPHEDRINE SULFATE 50 MG/ML IJ SOLN
INTRAMUSCULAR | Status: AC
  Filled 2014-08-26: qty 1

## 2014-08-26 MED ORDER — MENTHOL 3 MG MT LOZG: 1.0000 | LOZENGE | OROMUCOSAL | Status: DC | PRN

## 2014-08-26 MED ORDER — CHLORHEXIDINE GLUCONATE 4 % EX LIQD
60.0000 mL | Freq: Once | CUTANEOUS | Status: DC
Start: 2014-08-26 — End: 2014-08-26

## 2014-08-26 MED ORDER — CEFAZOLIN SODIUM-DEXTROSE 2-3 GM-% IV SOLR
INTRAVENOUS | Status: AC
  Filled 2014-08-26: qty 50

## 2014-08-26 MED ORDER — LEVOTHYROXINE SODIUM 50 MCG PO TABS
50.0000 ug | ORAL_TABLET | Freq: Every day | ORAL | Status: DC
  Administered 2014-08-27 – 2014-08-28 (×2): 50 ug via ORAL
  Filled 2014-08-26 (×3): qty 1

## 2014-08-26 MED ORDER — BISACODYL 10 MG RE SUPP: 10.0000 mg | Freq: Every day | RECTAL | Status: DC | PRN

## 2014-08-26 MED ORDER — POLYETHYLENE GLYCOL 3350 17 G PO PACK: 17.0000 g | PACK | Freq: Every day | ORAL | Status: DC | PRN

## 2014-08-26 MED ORDER — TRAMADOL HCL 50 MG PO TABS
50.0000 mg | ORAL_TABLET | Freq: Four times a day (QID) | ORAL | Status: DC | PRN
  Administered 2014-08-26: 50 mg via ORAL
  Filled 2014-08-26: qty 1

## 2014-08-26 MED ORDER — MIDAZOLAM HCL 2 MG/2ML IJ SOLN
INTRAMUSCULAR | Status: AC
  Filled 2014-08-26: qty 4

## 2014-08-26 MED ORDER — HYDROMORPHONE HCL 1 MG/ML IJ SOLN
0.2500 mg | INTRAMUSCULAR | Status: DC | PRN
  Administered 2014-08-26 (×2): 0.5 mg via INTRAVENOUS

## 2014-08-26 MED ORDER — DEXAMETHASONE SODIUM PHOSPHATE 10 MG/ML IJ SOLN
10.0000 mg | Freq: Once | INTRAMUSCULAR | Status: AC
  Administered 2014-08-26: 10 mg via INTRAVENOUS

## 2014-08-26 MED ORDER — ACETAMINOPHEN 650 MG RE SUPP: 650.0000 mg | Freq: Four times a day (QID) | RECTAL | Status: DC | PRN

## 2014-08-26 MED ORDER — DEXAMETHASONE SODIUM PHOSPHATE 10 MG/ML IJ SOLN
INTRAMUSCULAR | Status: AC
  Filled 2014-08-26: qty 1

## 2014-08-26 MED ORDER — CEFAZOLIN SODIUM-DEXTROSE 2-3 GM-% IV SOLR
2.0000 g | INTRAVENOUS | Status: AC
  Administered 2014-08-26: 2 g via INTRAVENOUS

## 2014-08-26 MED ORDER — ACETAMINOPHEN 10 MG/ML IV SOLN
INTRAVENOUS | Status: AC
Start: 2014-08-26 — End: 2014-08-26
  Filled 2014-08-26: qty 100

## 2014-08-26 MED ORDER — FLEET ENEMA 7-19 GM/118ML RE ENEM: 1.0000 | ENEMA | Freq: Once | RECTAL | Status: DC | PRN

## 2014-08-26 MED ORDER — BUPIVACAINE HCL (PF) 0.25 % IJ SOLN
INTRAMUSCULAR | Status: DC | PRN
  Administered 2014-08-26: 30 mL

## 2014-08-26 MED ORDER — ACETAMINOPHEN 10 MG/ML IV SOLN
1000.0000 mg | Freq: Once | INTRAVENOUS | Status: AC
  Administered 2014-08-26: 1000 mg via INTRAVENOUS
  Filled 2014-08-26: qty 100

## 2014-08-26 MED ORDER — HYDROMORPHONE HCL 1 MG/ML IJ SOLN
INTRAMUSCULAR | Status: AC
  Filled 2014-08-26: qty 1

## 2014-08-26 MED ORDER — METHOCARBAMOL 500 MG PO TABS
500.0000 mg | ORAL_TABLET | Freq: Four times a day (QID) | ORAL | Status: DC | PRN
  Administered 2014-08-27: 500 mg via ORAL
  Filled 2014-08-26 (×2): qty 1

## 2014-08-26 MED ORDER — PHENOL 1.4 % MT LIQD
1.0000 | OROMUCOSAL | Status: DC | PRN
  Filled 2014-08-26: qty 177

## 2014-08-26 MED ORDER — METOCLOPRAMIDE HCL 10 MG PO TABS: 5.0000 mg | ORAL_TABLET | Freq: Three times a day (TID) | ORAL | Status: DC | PRN

## 2014-08-26 MED ORDER — BUPIVACAINE HCL (PF) 0.25 % IJ SOLN
INTRAMUSCULAR | Status: AC
  Filled 2014-08-26: qty 30

## 2014-08-26 MED ORDER — TRANEXAMIC ACID 1000 MG/10ML IV SOLN
1000.0000 mg | INTRAVENOUS | Status: AC
  Administered 2014-08-26: 1000 mg via INTRAVENOUS
  Filled 2014-08-26: qty 10

## 2014-08-26 MED ORDER — SODIUM CHLORIDE 0.9 % IJ SOLN
INTRAMUSCULAR | Status: AC
  Filled 2014-08-26: qty 10

## 2014-08-26 MED ORDER — ACETAMINOPHEN 500 MG PO TABS
1000.0000 mg | ORAL_TABLET | Freq: Four times a day (QID) | ORAL | Status: AC
  Administered 2014-08-26 – 2014-08-27 (×3): 1000 mg via ORAL
  Filled 2014-08-26 (×3): qty 2

## 2014-08-26 MED ORDER — LACTATED RINGERS IV SOLN
INTRAVENOUS | Status: DC | PRN
  Administered 2014-08-26 (×2): via INTRAVENOUS

## 2014-08-26 MED ORDER — ONDANSETRON HCL 4 MG PO TABS: 4.0000 mg | ORAL_TABLET | Freq: Four times a day (QID) | ORAL | Status: DC | PRN

## 2014-08-26 MED ORDER — EPHEDRINE SULFATE 50 MG/ML IJ SOLN
INTRAMUSCULAR | Status: DC | PRN
  Administered 2014-08-26: 5 mg via INTRAVENOUS

## 2014-08-26 MED ORDER — ROCURONIUM BROMIDE 100 MG/10ML IV SOLN
INTRAVENOUS | Status: AC
  Filled 2014-08-26: qty 1

## 2014-08-26 MED ORDER — ONDANSETRON HCL 4 MG/2ML IJ SOLN
INTRAMUSCULAR | Status: AC
  Filled 2014-08-26: qty 2

## 2014-08-26 MED ORDER — LIDOCAINE HCL (CARDIAC) 20 MG/ML IV SOLN
INTRAVENOUS | Status: AC
  Filled 2014-08-26: qty 5

## 2014-08-26 MED ORDER — ONDANSETRON HCL 4 MG/2ML IJ SOLN
INTRAMUSCULAR | Status: DC | PRN
  Administered 2014-08-26: 4 mg via INTRAVENOUS

## 2014-08-26 MED ORDER — DIPHENHYDRAMINE HCL 12.5 MG/5ML PO ELIX: 12.5000 mg | ORAL_SOLUTION | ORAL | Status: DC | PRN

## 2014-08-26 MED ORDER — DOCUSATE SODIUM 100 MG PO CAPS
100.0000 mg | ORAL_CAPSULE | Freq: Two times a day (BID) | ORAL | Status: DC
  Administered 2014-08-27 – 2014-08-28 (×3): 100 mg via ORAL

## 2014-08-26 MED ORDER — METOCLOPRAMIDE HCL 5 MG/ML IJ SOLN: 5.0000 mg | Freq: Three times a day (TID) | INTRAMUSCULAR | Status: DC | PRN

## 2014-08-26 MED ORDER — SODIUM CHLORIDE 0.9 % IR SOLN
Status: DC | PRN
  Administered 2014-08-26: 1000 mL

## 2014-08-26 MED ORDER — BUPIVACAINE IN DEXTROSE 0.75-8.25 % IT SOLN
INTRATHECAL | Status: DC | PRN
  Administered 2014-08-26: 2 mL via INTRATHECAL

## 2014-08-26 MED ORDER — DEXTROSE-NACL 5-0.9 % IV SOLN
INTRAVENOUS | Status: DC
  Administered 2014-08-26 – 2014-08-27 (×2): via INTRAVENOUS

## 2014-08-26 MED ORDER — FENTANYL CITRATE (PF) 250 MCG/5ML IJ SOLN
INTRAMUSCULAR | Status: AC
  Filled 2014-08-26: qty 25

## 2014-08-26 SURGICAL SUPPLY — 43 items
BAG DECANTER FOR FLEXI CONT (MISCELLANEOUS) ×3 IMPLANT
BAG ZIPLOCK 12X15 (MISCELLANEOUS) ×3 IMPLANT
BLADE EXTENDED COATED 6.5IN (ELECTRODE) ×3 IMPLANT
BLADE SAG 18X100X1.27 (BLADE) ×3 IMPLANT
CAPT HIP TOTAL 2 ×3 IMPLANT
CLOSURE WOUND 1/2 X4 (GAUZE/BANDAGES/DRESSINGS) ×1
COVER PERINEAL POST (MISCELLANEOUS) ×3 IMPLANT
DECANTER SPIKE VIAL GLASS SM (MISCELLANEOUS) ×3 IMPLANT
DRAPE C-ARM 42X120 X-RAY (DRAPES) ×3 IMPLANT
DRAPE STERI IOBAN 125X83 (DRAPES) ×3 IMPLANT
DRAPE U-SHAPE 47X51 STRL (DRAPES) ×9 IMPLANT
DRSG ADAPTIC 3X8 NADH LF (GAUZE/BANDAGES/DRESSINGS) ×3 IMPLANT
DRSG MEPILEX BORDER 4X4 (GAUZE/BANDAGES/DRESSINGS) ×3 IMPLANT
DRSG MEPILEX BORDER 4X8 (GAUZE/BANDAGES/DRESSINGS) ×3 IMPLANT
DURAPREP 26ML APPLICATOR (WOUND CARE) ×3 IMPLANT
ELECT REM PT RETURN 9FT ADLT (ELECTROSURGICAL) ×3
ELECTRODE REM PT RTRN 9FT ADLT (ELECTROSURGICAL) ×1 IMPLANT
EVACUATOR 1/8 PVC DRAIN (DRAIN) ×3 IMPLANT
FACESHIELD WRAPAROUND (MASK) ×12 IMPLANT
GLOVE BIO SURGEON STRL SZ7.5 (GLOVE) ×3 IMPLANT
GLOVE BIO SURGEON STRL SZ8 (GLOVE) ×6 IMPLANT
GLOVE BIOGEL PI IND STRL 8 (GLOVE) ×2 IMPLANT
GLOVE BIOGEL PI INDICATOR 8 (GLOVE) ×4
GOWN STRL REUS W/TWL LRG LVL3 (GOWN DISPOSABLE) ×3 IMPLANT
GOWN STRL REUS W/TWL XL LVL3 (GOWN DISPOSABLE) ×3 IMPLANT
KIT BASIN OR (CUSTOM PROCEDURE TRAY) ×3 IMPLANT
NDL SAFETY ECLIPSE 18X1.5 (NEEDLE) ×1 IMPLANT
NEEDLE HYPO 18GX1.5 SHARP (NEEDLE) ×2
PACK TOTAL JOINT (CUSTOM PROCEDURE TRAY) ×3 IMPLANT
PEN SKIN MARKING BROAD (MISCELLANEOUS) ×3 IMPLANT
STRIP CLOSURE SKIN 1/2X4 (GAUZE/BANDAGES/DRESSINGS) ×2 IMPLANT
SUT ETHIBOND NAB CT1 #1 30IN (SUTURE) ×3 IMPLANT
SUT MNCRL AB 4-0 PS2 18 (SUTURE) ×3 IMPLANT
SUT VIC AB 2-0 CT1 27 (SUTURE) ×4
SUT VIC AB 2-0 CT1 TAPERPNT 27 (SUTURE) ×2 IMPLANT
SUT VLOC 180 0 24IN GS25 (SUTURE) ×3 IMPLANT
SYR 30ML LL (SYRINGE) ×3 IMPLANT
SYR 50ML LL SCALE MARK (SYRINGE) IMPLANT
TOWEL OR 17X26 10 PK STRL BLUE (TOWEL DISPOSABLE) ×3 IMPLANT
TOWEL OR NON WOVEN STRL DISP B (DISPOSABLE) ×3 IMPLANT
TRAY FOLEY W/METER SILVER 14FR (SET/KITS/TRAYS/PACK) ×3 IMPLANT
TRAY FOLEY W/METER SILVER 16FR (SET/KITS/TRAYS/PACK) IMPLANT
YANKAUER SUCT BULB TIP 10FT TU (MISCELLANEOUS) IMPLANT

## 2014-08-26 NOTE — Anesthesia Postprocedure Evaluation (Signed)
  Anesthesia Post-op Note  Patient: Haley Schneider  Procedure(s) Performed: Procedure(s) (LRB): RIGHT TOTAL HIP ARTHROPLASTY ANTERIOR APPROACH (Right)  Patient Location: PACU  Anesthesia Type: Spinal  Level of Consciousness: awake and alert   Airway and Oxygen Therapy: Patient Spontanous Breathing  Post-op Pain: mild  Post-op Assessment: Post-op Vital signs reviewed, Patient's Cardiovascular Status Stable, Respiratory Function Stable, Patent Airway and No signs of Nausea or vomiting. Spinal is resolving. Last Vitals:  Filed Vitals:   08/26/14 1400  BP: 123/58  Pulse: 79  Temp: 37.1 C  Resp: 16    Post-op Vital Signs: stable   Complications: No apparent anesthesia complications

## 2014-08-26 NOTE — Interval H&P Note (Signed)
History and Physical Interval Note:  08/26/2014 6:38 AM  Haley Schneider  has presented today for surgery, with the diagnosis of OA RIGHT HIP  The various methods of treatment have been discussed with the patient and family. After consideration of risks, benefits and other options for treatment, the patient has consented to  Procedure(s): RIGHT TOTAL HIP ARTHROPLASTY ANTERIOR APPROACH (Right) as a surgical intervention .  The patient's history has been reviewed, patient examined, no change in status, stable for surgery.  I have reviewed the patient's chart and labs.  Questions were answered to the patient's satisfaction.     Gearlean Alf

## 2014-08-26 NOTE — Progress Notes (Signed)
X-ray results noted 

## 2014-08-26 NOTE — Progress Notes (Signed)
Utilization review completed.  

## 2014-08-26 NOTE — Progress Notes (Signed)
Portable AP Pelvis X-ray done. 

## 2014-08-26 NOTE — H&P (View-Only) (Signed)
[name] DOB: December 11, 1935 Widowed / Language: Cleophus Molt / Race: White Female Date of Admission:  08/26/2014 CC:  Right Hip Pain History of Present Illness  The patient is a 79 year old female who comes in for a preoperative History and Physical. The patient is scheduled for a right total hip arthroplasty (anterior) to be performed by Dr. Dione Plover. Aluisio, MD at [facility] on 08/26/2014. The patient is being followed for their right hip pain and osteoarthritis. They are weeks out from an I-A injection. Symptoms reported include: pain. The patient feels that they are doing poorly. Patient states that the injection did not help her hip. Pain is in her groin, anterior thigh, radiating to her right knee. Intraarticular injection did not provide much benefit. She is losing more mobility in her right hip. She says this hip feels as bad as the left one did prior to when she had that fixed. She is ready to proceed with surgery. They have been treated conservatively in the past for the above stated problem and despite conservative measures, they continue to have progressive pain and severe functional limitations and dysfunction. They have failed non-operative management including home exercise, medications, and injections. It is felt that they would benefit from undergoing total joint replacement. Risks and benefits of the procedure have been discussed with the patient and they elect to proceed with surgery. There are no active contraindications to surgery such as ongoing infection or rapidly progressive neurological disease.  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 Hypertension Father. Cancer Father.  Social History Current work status retired Games developer weekly; does other Living situation live alone Children 2 Tobacco use Never smoker. 03/18/2014 Not under pain contract Marital status widowed Never consumed alcohol 03/18/2014: Never consumed alcohol No history of drug/alcohol rehab  Medication History Synthroid (50MCG Tablet, Oral) Active. Raloxifene HCl (60MG  Tablet, Oral) Active. Vitamin B-12 (1000MCG Tablet, 5000 Oral daily) Active. Magnesium (Oral) Specific dose unknown - Active. (with zinc) Lutein (20MG  Tablet, Oral) Active. Vitamin A & D (Oral) Active. Vitamin D-3 (1000UNIT Capsule, Oral) Active. Biotin (5000MCG Tablet, 10,000 Oral) Active. Vitamin C ER (1000MG  Tablet ER, Oral) Active. Omega-3 Krill Oil (3x Oral weekly) Specific dose unknown - Active. Aspirin (81MG  Tablet, Oral occasionally) Active. Losartan Potassium (Oral) Specific dose unknown - Active. TraMADol HCl (50MG  Tablet, 1-2 Tablet Oral every 6-8 hours as needed for pain, Taken starting 04/28/2014) Active.  Past Surgical History Thyroidectomy; Subtotal Tonsillectomy Total Hip Replacement left Cataract Surgery bilateral Inguinal Hernia Repair open: left   Review of Systems General Not Present- Chills, Fatigue, Fever, Memory Loss, Night Sweats, Weight Gain and Weight Loss. Skin Not Present- Eczema, Hives, Itching, Lesions and Rash. HEENT Not Present- Dentures, Double Vision, Headache, Hearing Loss, Tinnitus and Visual Loss. Respiratory Not Present- Allergies, Chronic Cough, Coughing up blood, Shortness of breath at rest and Shortness of breath with exertion. Cardiovascular Not Present- Chest Pain, Difficulty Breathing Lying Down, Murmur, Palpitations, Racing/skipping heartbeats and Swelling. Gastrointestinal Not Present- Abdominal Pain, Bloody Stool, Constipation, Diarrhea, Difficulty Swallowing, Heartburn, Jaundice, Loss  of appetitie, Nausea and Vomiting. Female Genitourinary Not Present- Blood in Urine, Discharge, Flank Pain, Incontinence, Painful Urination, Urgency, Urinary frequency, Urinary Retention, Urinating at Night and Weak urinary stream. Musculoskeletal Present- Joint Pain. Not  Present- Back Pain, Joint Swelling, Morning Stiffness, Muscle Pain, Muscle Weakness and Spasms. Neurological Not Present- Blackout spells, Difficulty with balance, Dizziness, Paralysis, Tremor and Weakness. Psychiatric Not Present- Insomnia.  Vitals  Weight: 152 lb Height: 65.5in Weight was reported by patient. Height was reported by patient. Body Surface Area: 1.77 m Body Mass Index: 24.91 kg/m  BP: 122/70 (Sitting, Right Arm, Standard)   Physical Exam General Mental Status -Alert, cooperative and good historian. General Appearance-pleasant, Not in acute distress. Orientation-Oriented X3. Build & Nutrition-Well nourished and Well developed.  Head and Neck Head-normocephalic, atraumatic . Neck Global Assessment - supple, no bruit auscultated on the right, no bruit auscultated on the left.  Eye Pupil - Bilateral-Regular and Round. Motion - Bilateral-EOMI.  Chest and Lung Exam Auscultation Breath sounds - clear at anterior chest wall and clear at posterior chest wall. Adventitious sounds - No Adventitious sounds.  Cardiovascular Auscultation Rhythm - Regular rate and rhythm. Heart Sounds - S1 WNL and S2 WNL. Murmurs & Other Heart Sounds - Auscultation of the heart reveals - No Murmurs.  Abdomen Palpation/Percussion Tenderness - Abdomen is non-tender to palpation. Rigidity (guarding) - Abdomen is soft. Auscultation Auscultation of the abdomen reveals - Bowel sounds normal.  Female Genitourinary Note: Not done, not pertinent to present illness   Musculoskeletal Note: On exam, she is in no distress. Her right hip can be flexed to about 90, minimal internal rotation with  reproduction of her knee pain when I internally rotate her hip. She only has about 10 to 20 degrees of external rotation, 20 degrees of abduction. There is no pain on range of motion of her left hip.  Left Lower Extremity: Left Knee: Inspection and Palpation - Tenderness - lateral knee tender to palpation and medial knee tender to palpation(more on the medial side). Effusion - moderate. Sensation - normal. Other characteristics - no ecchymosis, no erythema. ROM: Flexion - AROM - 120 . PROM - 125 . Extension - AROM - 5 . PROM - 0 . Gait and Station: Evaluation of Gait - Gait Pattern - slight limp. Gait and Station - [employer] - cane. Weightbearing - weightbearing as tolerated.  RADIOGRAPHS Her radiographs are reviewed, AP pelvis and lateral of the right hip. She has got advanced end stage arthritis of the right hip. Bone on bone changes with large subchondral cyst.   Assessment & Plan Primary osteoarthritis of right knee (M17.11) Note:Surgical Plans: Right Total Hip Replacement - Anterior Approach  Disposition: Home  PCP: Dr. Sanda Klein at [facility], [place]  IV TXA  Anesthesia Issues: None  Signed electronically by Joelene Millin, III PA-C

## 2014-08-26 NOTE — Op Note (Signed)
OPERATIVE REPORT  PREOPERATIVE DIAGNOSIS: Osteoarthritis of the Right hip.   POSTOPERATIVE DIAGNOSIS: Osteoarthritis of the Right  hip.   PROCEDURE: Right total hip arthroplasty, anterior approach.   SURGEON: Gaynelle Arabian, MD   ASSISTANT: Frederik Pear, MD  ANESTHESIA:  Spinal  ESTIMATED BLOOD LOSS:-300 ml    DRAINS: Hemovac x1.   COMPLICATIONS: None   CONDITION: PACU - hemodynamically stable.   BRIEF CLINICAL NOTE: Haley Schneider is a 79 y.o. female who has advanced end-  stage arthritis of her Right  hip with progressively worsening pain and  dysfunction.The patient has failed nonoperative management and presents for  total hip arthroplasty.   PROCEDURE IN DETAIL: After successful administration of spinal  anesthetic, the traction boots for the [facility] bed were placed on both  feet and the patient was placed onto the [facility] bed, boots placed into the leg  holders. The Right hip was then isolated from the perineum with plastic  drapes and prepped and draped in the usual sterile fashion. ASIS and  greater trochanter were marked and a oblique incision was made, starting  at about 1 cm lateral and 2 cm distal to the ASIS and coursing towards  the anterior cortex of the femur. The skin was cut with a 10 blade  through subcutaneous tissue to the level of the fascia overlying the  tensor fascia lata muscle. The fascia was then incised in line with the  incision at the junction of the anterior third and posterior 2/3rd. The  muscle was teased off the fascia and then the interval between the TFL  and the rectus was developed. The Hohmann retractor was then placed at  the top of the femoral neck over the capsule. The vessels overlying the  capsule were cauterized and the fat on top of the capsule was removed.  A Hohmann retractor was then placed anterior underneath the rectus  femoris to give exposure to the entire anterior capsule. A T-shaped  capsulotomy was performed.  The edges were tagged and the femoral head  was identified.       Osteophytes are removed off the superior acetabulum.  The femoral neck was then cut in situ with an oscillating saw. Traction  was then applied to the left lower extremity utilizing the [facility]  traction. The femoral head was then removed. Retractors were placed  around the acetabulum and then circumferential removal of the labrum was  performed. Osteophytes were also removed. Reaming starts at 45 mm to  medialize and  Increased in 2 mm increments to 51 mm. We reamed in  approximately 40 degrees of abduction, 20 degrees anteversion. A 52 mm  pinnacle acetabular shell was then impacted in anatomic position under  fluoroscopic guidance with excellent purchase. We did not need to place  any additional dome screws. A 32 mm neutral + 4 marathon liner was then  placed into the acetabular shell.       The femoral lift was then placed along the lateral aspect of the femur  just distal to the vastus ridge. The leg was  externally rotated and capsule  was stripped off the inferior aspect of the femoral neck down to the  level of the lesser trochanter, this was done with electrocautery. The femur was lifted after this was performed. The  leg was then placed and extended in adducted position to essentially delivering the femur. We also removed the capsule superiorly and the  piriformis from the  piriformis fossa to gain excellent exposure of the  proximal femur. Rongeur was used to remove some cancellous bone to get  into the lateral portion of the proximal femur for placement of the  initial starter reamer. The starter broaches was placed  the starter broach  and was shown to go down the center of the canal. Broaching  with the  Corail system was then performed starting at size 8, coursing  Up to size 12. A size 12 had excellent torsional and rotational  and axial stability. The trial standard offset neck was then placed  with a 32 + 9  trial head. The hip was then reduced. We confirmed that  the stem was in the canal both on AP and lateral x-rays. It also has excellent sizing. The hip was reduced with outstanding stability through full extension, full external rotation,  and then flexion in adduction internal rotation. AP pelvis was taken  and the leg lengths were measured and found to be exactly equal. Hip  was then dislocated again and the femoral head and neck removed. The  femoral broach was removed. Size 12 Corail stem with a standard offset  neck was then impacted into the femur following native anteversion. Has  excellent purchase in the canal. Excellent torsional and rotational and  axial stability. It is confirmed to be in the canal on AP and lateral  fluoroscopic views. The 32 + 9 ceramic head was placed and the hip  reduced with outstanding stability. Again AP pelvis was taken and it  confirmed that the leg lengths were equal. The wound was then copiously  irrigated with saline solution and the capsule reattached and repaired  with Ethibond suture. 30 ml of .25% Bupivicaine injected into the capsule and into the edge of the tensor fascia lata as well as subcutaneous tissue. The fascia overlying the tensor fascia lata was  then closed with a running #1 V-Loc. Subcu was closed with interrupted  2-0 Vicryl and subcuticular running 4-0 Monocryl. Incision was cleaned  and dried. Steri-Strips and a bulky sterile dressing applied. Hemovac  drain was hooked to suction and then he was awakened and transported to  recovery in stable condition.        Please note that a surgical assistant was a medical necessity for this procedure to perform it in a safe and expeditious manner. Assistant was necessary to provide appropriate retraction of vital neurovascular structures and to prevent femoral fracture and allow for anatomic placement of the prosthesis.  Gaynelle Arabian, M.D.

## 2014-08-26 NOTE — Evaluation (Signed)
Physical Therapy Evaluation Patient Details Name: Haley Schneider MRN: [id number] DOB: 1935-10-08 Today's Date: 08/26/2014   History of Present Illness  R THR; s/p L THR ~ 7 yrs  Clinical Impression  Pt s/p R THR presents with decreased R LE strength/ROM and post op pain limiting functional mobility.  Pt should progress to dc home with 24/7 assist of family and HHPT.    Follow Up Recommendations Home health PT    Equipment Recommendations  None recommended by PT    Recommendations for Other Services OT consult     Precautions / Restrictions Precautions Precautions: Fall Restrictions Weight Bearing Restrictions: No Other Position/Activity Restrictions: WBAT      Mobility  Bed Mobility Overal bed mobility: Needs Assistance Bed Mobility: Supine to Sit     Supine to sit: Mod assist     General bed mobility comments: cues for sequence and use of L LE to self assist  Transfers Overall transfer level: Needs assistance Equipment used: Rolling walker (2 wheeled) Transfers: Sit to/from Stand Sit to Stand: Mod assist         General transfer comment: cues for LE management and use of UEs to self assist  Ambulation/Gait Ambulation/Gait assistance: Mod assist Ambulation Distance (Feet): 18 Feet Assistive device: Rolling walker (2 wheeled) Gait Pattern/deviations: Step-to pattern;Decreased step length - right;Decreased step length - left;Shuffle;Trunk flexed Gait velocity: decr   General Gait Details: cues for sequence, posture and position from [employer]            Wheelchair Mobility    Modified Rankin (Stroke Patients Only)       Balance                                             Pertinent Vitals/Pain Pain Assessment: 0-10 Pain Score: 5  Pain Location: R hip Pain Descriptors / Indicators: Aching;Sore Pain Intervention(s): Limited activity within patient's tolerance;Monitored during session;Premedicated before session;Ice  applied    Home Living Family/patient expects to be discharged to:: Private residence Living Arrangements: Alone Available Help at Discharge: Family Type of Home: House Home Access: Stairs to enter Entrance Stairs-Rails: Psychiatric nurse of Steps: 5 Home Layout: Two level;Able to live on main level with bedroom/bathroom Home Equipment: Gilford Rile - 2 wheels;Hospital bed Additional Comments: Pt states has rented hospital bed     Prior Function Level of Independence: Independent with assistive device(s)               Hand Dominance        Extremity/Trunk Assessment   Upper Extremity Assessment: Overall WFL for tasks assessed           Lower Extremity Assessment: RLE deficits/detail      Cervical / Trunk Assessment: Normal  Communication   Communication: No difficulties  Cognition Arousal/Alertness: Awake/alert Behavior During Therapy: WFL for tasks assessed/performed Overall Cognitive Status: Within Functional Limits for tasks assessed                      General Comments      Exercises Total Joint Exercises Ankle Circles/Pumps: AROM;10 reps;Supine;Both Heel Slides: AAROM;Right;5 reps;Supine Hip ABduction/ADduction: AAROM;Right;5 reps;Supine      Assessment/Plan    PT Assessment Patient needs continued PT services  PT Diagnosis Difficulty walking   PT Problem List Decreased strength;Decreased range of motion;Decreased activity tolerance;Decreased mobility;Decreased knowledge of use  of DME;Pain  PT Treatment Interventions DME instruction;Gait training;Stair training;Functional mobility training;Therapeutic activities;Therapeutic exercise;Patient/family education   PT Goals (Current goals can be found in the Care Plan section) Acute Rehab PT Goals Patient Stated Goal: Walk with less pain PT Goal Formulation: With patient Time For Goal Achievement: 08/29/14 Potential to Achieve Goals: Good    Frequency 7X/week   Barriers to  discharge        Co-evaluation               End of Session Equipment Utilized During Treatment: Gait belt Activity Tolerance: Patient tolerated treatment well Patient left: in chair;with call bell/phone within reach Nurse Communication: Mobility status         Time: 2446-2863 PT Time Calculation (min) (ACUTE ONLY): 28 min   Charges:   PT Evaluation $Initial PT Evaluation Tier I: 1 Procedure PT Treatments $Gait Training: 8-22 mins   PT G Codes:        Laquanta Hummel Sep 03, 2014, 5:04 PM

## 2014-08-26 NOTE — Transfer of Care (Signed)
Immediate Anesthesia Transfer of Care Note  Patient: Haley Schneider  Procedure(s) Performed: Procedure(s): RIGHT TOTAL HIP ARTHROPLASTY ANTERIOR APPROACH (Right)  Patient Location: PACU  Anesthesia Type:MAC and Spinal  Level of Consciousness: awake, alert  and oriented  Airway & Oxygen Therapy: Patient Spontanous Breathing and Patient connected to face mask oxygen  Post-op Assessment: Report given to RN and Post -op Vital signs reviewed and stable  Post vital signs: Reviewed and stable  Last Vitals:  Filed Vitals:   08/26/14 0510  BP: 153/78  Pulse: 96  Temp: 36.5 C  Resp: 16    Complications: No apparent anesthesia complications

## 2014-08-26 NOTE — Anesthesia Procedure Notes (Signed)
Spinal Patient location during procedure: OR Start time: 08/26/2014 7:20 AM Staffing Anesthesiologist: Franne Grip Performed by: anesthesiologist  Preanesthetic Checklist Completed: patient identified, site marked, surgical consent, pre-op evaluation, timeout performed, IV checked, risks and benefits discussed and monitors and equipment checked Spinal Block Patient position: sitting Prep: Betadine Patient monitoring: heart rate, continuous pulse ox and blood pressure Approach: midline Location: L3-4 Injection technique: single-shot Needle Needle type: Spinocan  Needle gauge: 22 G Needle length: 9 cm Additional Notes Expiration date of kit checked and confirmed. Patient tolerated procedure well, without complications. CSF clear. No paresthesia. Good block

## 2014-08-27 ENCOUNTER — Encounter (HOSPITAL_COMMUNITY): Payer: Self-pay | Admitting: Orthopedic Surgery

## 2014-08-27 LAB — CBC
HEMATOCRIT: 32.1 % — AB (ref 36.0–46.0)
HEMOGLOBIN: 10.9 g/dL — AB (ref 12.0–15.0)
MCH: 29.6 pg (ref 26.0–34.0)
MCHC: 34 g/dL (ref 30.0–36.0)
MCV: 87.2 fL (ref 78.0–100.0)
Platelets: 271 10*3/uL (ref 150–400)
RBC: 3.68 MIL/uL — AB (ref 3.87–5.11)
RDW: 13.7 % (ref 11.5–15.5)
WBC: 10.2 10*3/uL (ref 4.0–10.5)

## 2014-08-27 LAB — BASIC METABOLIC PANEL
Anion gap: 5 (ref 5–15)
BUN: 8 mg/dL (ref 6–20)
CHLORIDE: 103 mmol/L (ref 101–111)
CO2: 27 mmol/L (ref 22–32)
CREATININE: 0.6 mg/dL (ref 0.44–1.00)
Calcium: 9.2 mg/dL (ref 8.9–10.3)
GFR calc Af Amer: >60 mL/min (ref >60)
GFR calc non Af Amer: >60 mL/min (ref >60)
GLUCOSE: 124 mg/dL — AB (ref 65–99)
POTASSIUM: 3.9 mmol/L (ref 3.5–5.1)
Sodium: 135 mmol/L (ref 135–145)

## 2014-08-27 MED ORDER — CYCLOBENZAPRINE HCL 10 MG PO TABS
10.0000 mg | ORAL_TABLET | Freq: Three times a day (TID) | ORAL | Status: DC | PRN
  Administered 2014-08-27: 10 mg via ORAL
  Filled 2014-08-27: qty 1

## 2014-08-27 NOTE — Evaluation (Signed)
Occupational Therapy Evaluation Patient Details Name: Haley Schneider MRN: [id number] DOB: 01-16-1935 Today's Date: 08/27/2014    History of Present Illness R THR; s/p L THR ~ 7 yrs   Clinical Impression   This 79 year old female was admitted for the above surgery.  She will benefit from skilled OT to increase safety and independence with toilet transfers/standing for adls.  She will have 24/7 assist at home.  Goals are for min guard to supervision for these activities in acute.    Follow Up Recommendations  Supervision/Assistance - 24 hour    Equipment Recommendations  None recommended by OT    Recommendations for Other Services       Precautions / Restrictions Precautions Precautions: Fall Restrictions Weight Bearing Restrictions: No Other Position/Activity Restrictions: WBAT      Mobility Bed Mobility   Bed Mobility: Supine to Sit     Supine to sit: Min assist     General bed mobility comments: assist for RLE and light assistance for trunk.  Pt used bed rails and HOB raised 20 degrees  Transfers   Equipment used: Rolling walker (2 wheeled) Transfers: Sit to/from Stand Sit to Stand: Min assist         General transfer comment: from high bed; cues for UE/LE placement    Balance                                            ADL Overall ADL's : Needs assistance/impaired                         Toilet Transfer: Minimal assistance;Stand-pivot (to recliner)             General ADL Comments: pt will have 24/7 assistance at home.  She used to have a reacher but no longer has this.  She plans to wear housedresses and depends. Will likely have family assist; depends are not easy to don with reacher. Pt is able to complete UB adls with set up and LB bathing with mod A, LB dressing with max A     Vision     Perception     Praxis      Pertinent Vitals/Pain Pain Score: 6  (with weightbearing; better sitting/lying in  bed) Pain Location: R hip Pain Descriptors / Indicators: Aching Pain Intervention(s): Limited activity within patient's tolerance;Monitored during session;Premedicated before session;Repositioned     Hand Dominance     Extremity/Trunk Assessment Upper Extremity Assessment Upper Extremity Assessment: Overall WFL for tasks assessed           Communication Communication Communication: No difficulties   Cognition Arousal/Alertness: Awake/alert Behavior During Therapy: WFL for tasks assessed/performed Overall Cognitive Status: Within Functional Limits for tasks assessed                     General Comments       Exercises       Shoulder Instructions      Home Living Family/patient expects to be discharged to:: Private residence Living Arrangements: Alone Available Help at Discharge: Family;Available 24 hours/day (rotating)               Bathroom Shower/Tub:  (will sponge bathe downstairs; shower upstairs)   Bathroom Toilet: Standard     Home Equipment: Bedside commode   Additional Comments: Pt states has rented  hospital bed       Prior Functioning/Environment Level of Independence: Independent with assistive device(s)             OT Diagnosis: Generalized weakness   OT Problem List: Decreased strength;Decreased activity tolerance;Pain;Decreased knowledge of use of DME or AE   OT Treatment/Interventions: Self-care/ADL training;DME and/or AE instruction;Patient/family education    OT Goals(Current goals can be found in the care plan section) Acute Rehab OT Goals Patient Stated Goal: be able to get up and use commode  OT Goal Formulation: With patient Time For Goal Achievement: 09/03/14 Potential to Achieve Goals: Good ADL Goals Pt Will Perform Grooming: with supervision;standing Pt Will Transfer to Toilet: with min guard assist;ambulating;bedside commode Pt Will Perform Toileting - Clothing Manipulation and hygiene: with supervision  OT  Frequency: Min 2X/week   Barriers to D/C:            Co-evaluation              End of Session    Activity Tolerance: Patient tolerated treatment well Patient left: in chair;with call bell/phone within reach;with nursing/sitter in room   Time: 1016-1031 OT Time Calculation (min): 15 min Charges:  OT General Charges $OT Visit: 1 Procedure OT Evaluation $Initial OT Evaluation Tier I: 1 Procedure G-Codes:    Earsie Humm 2014/09/24, 10:39 AM Lesle Chris, OTR/L [phone] 09/24/2014

## 2014-08-27 NOTE — Progress Notes (Signed)
   Subjective: 1 Day Post-Op Procedure(s) (LRB): RIGHT TOTAL HIP ARTHROPLASTY ANTERIOR APPROACH (Right) Patient reports pain as mild.   Patient seen in rounds for Dr. Wynelle Link. Patient is well, but has had some minor complaints of pain in the hip, requiring pain medications We will resume therapy today.  Walked about 18 feet the day of surgery. Plan is to go Home after hospital stay.  Objective: Vital signs in last 24 hours: Temp:  [97.4 F (36.3 C)-98.8 F (37.1 C)] 98.8 F (37.1 C) (08/25 0535) Pulse Rate:  [63-82] 64 (08/25 0535) Resp:  [12-22] 16 (08/25 0535) BP: (100-133)/(53-71) 129/53 mmHg (08/25 0535) SpO2:  [97 %-100 %] 100 % (08/25 0535)  Intake/Output from previous day:  Intake/Output Summary (Last 24 hours) at 08/27/14 0857 Last data filed at 08/27/14 0526  Gross per 24 hour  Intake 2076.25 ml  Output   1520 ml  Net 556.25 ml    Intake/Output this shift: UOP 1000 from last shift last night  Labs:  Recent Labs  08/27/14 0420  HGB 10.9*    Recent Labs  08/27/14 0420  WBC 10.2  RBC 3.68*  HCT 32.1*  PLT 271    Recent Labs  08/27/14 0420  NA 135  K 3.9  CL 103  CO2 27  BUN 8  CREATININE 0.60  GLUCOSE 124*  CALCIUM 9.2   No results for input(s): LABPT, INR in the last 72 hours.  EXAM General - Patient is Alert, Appropriate and Oriented Extremity - Neurovascular intact Sensation intact distally Dressing - dressing C/D/I Motor Function - intact, moving foot and toes well on exam.  Hemovac pulled without difficulty.  Past Medical History  Diagnosis Date  . OP (osteoporosis)   . Menopause   . Fibroid, uterine   . Hypercholesteremia   . History of abnormal cervical Pap smear   . Hypothyroid   . Hypertension   . Vaginal vault prolapse   . Vaginal atrophy   . Urethral caruncle   . History of urinary tract infection   . Pessary maintenance     # 5 ring with support  . Female bladder prolapse   . PONV (postoperative nausea and  vomiting)   . Urinary urgency   . History of frequent urinary tract infections   . Urinary incontinence   . Nocturia   . GERD (gastroesophageal reflux disease)   . Arthritis     Assessment/Plan: 1 Day Post-Op Procedure(s) (LRB): RIGHT TOTAL HIP ARTHROPLASTY ANTERIOR APPROACH (Right) Principal Problem:   OA (osteoarthritis) of hip  Estimated body mass index is 24.9 kg/(m^2) as calculated from the following:   Height as of this encounter: 5' 5.5" (1.664 m).   Weight as of this encounter: 68.947 kg (152 lb). Advance diet Up with therapy Plan for discharge tomorrow Discharge home with home health  DVT Prophylaxis - Xarelto Weight Bearing As Tolerated right Leg Hemovac Pulled Begin Therapy  Arlee Muslim, PA-C Orthopaedic Surgery 08/27/2014, 8:57 AM

## 2014-08-27 NOTE — Progress Notes (Signed)
Physical Therapy Treatment Patient Details Name: Haley Schneider MRN: [id number] DOB: 02/06/1935 Today's Date: 08/27/2014    History of Present Illness R THR; s/p L THR ~ 7 yrs    PT Comments    Marked improvement in pain control and activity tolerance this pm.  Pt progressing well with mobility and hopeful for dc tomorrow.  Follow Up Recommendations  Home health PT     Equipment Recommendations  None recommended by PT    Recommendations for Other Services OT consult     Precautions / Restrictions Precautions Precautions: Fall Restrictions Weight Bearing Restrictions: No Other Position/Activity Restrictions: WBAT    Mobility  Bed Mobility Overal bed mobility: Needs Assistance Bed Mobility: Sit to Supine       Sit to supine: Min assist   General bed mobility comments: cues for sequence and use of L LE to self assist  Transfers Overall transfer level: Needs assistance Equipment used: Rolling walker (2 wheeled) Transfers: Sit to/from Stand Sit to Stand: Min assist         General transfer comment: cues for LE management and use of UEs to self assist  Ambulation/Gait Ambulation/Gait assistance: Min assist Ambulation Distance (Feet): 200 Feet Assistive device: Rolling walker (2 wheeled) Gait Pattern/deviations: Step-to pattern;Step-through pattern;Decreased step length - right;Decreased step length - left;Shuffle;Trunk flexed Gait velocity: decr   General Gait Details: cues for sequence, posture, ER on R, and position from [employer]    Modified Rankin (Stroke Patients Only)       Balance Overall balance assessment: Needs assistance Sitting-balance support: No upper extremity supported Sitting balance-Leahy Scale: Good     Standing balance support: Bilateral upper extremity supported Standing balance-Leahy Scale: Poor                      Cognition Arousal/Alertness: Awake/alert Behavior During  Therapy: WFL for tasks assessed/performed Overall Cognitive Status: Within Functional Limits for tasks assessed                      Exercises Total Joint Exercises Ankle Circles/Pumps: AROM;Supine;Both;15 reps Quad Sets: AROM;Both;10 reps;Supine Heel Slides: AAROM;Right;Supine;15 reps Hip ABduction/ADduction: AAROM;Right;Supine;15 reps    General Comments        Pertinent Vitals/Pain Pain Assessment: 0-10 Pain Score: 4  Pain Location: R hip Pain Descriptors / Indicators: Aching;Sore Pain Intervention(s): Limited activity within patient's tolerance;Monitored during session;Premedicated before session;Ice applied    Home Living                      Prior Function            PT Goals (current goals can now be found in the care plan section) Acute Rehab PT Goals Patient Stated Goal: Walk without pain PT Goal Formulation: With patient Time For Goal Achievement: 08/29/14 Potential to Achieve Goals: Good Progress towards PT goals: Progressing toward goals    Frequency  7X/week    PT Plan Current plan remains appropriate    Co-evaluation             End of Session Equipment Utilized During Treatment: Gait belt Activity Tolerance: Patient tolerated treatment well Patient left: in bed;with call bell/phone within reach     Time: 8889-1694 PT Time Calculation (min) (ACUTE ONLY): 32 min  Charges:  $Gait Training: 8-22 mins $Therapeutic Exercise: 8-22 mins  G Codes:      Surabhi Gadea 09/19/14, 3:55 PM

## 2014-08-27 NOTE — Discharge Instructions (Addendum)
° °Dr. Frank Aluisio °Total Joint Specialist °[facility] °[address] °[place], [place] [address] °[phone] ° °ANTERIOR APPROACH TOTAL HIP REPLACEMENT POSTOPERATIVE DIRECTIONS ° ° °Hip Rehabilitation, Guidelines Following Surgery  °The results of a hip operation are greatly improved after range of motion and muscle strengthening exercises. Follow all safety measures which are given to protect your hip. If any of these exercises cause increased pain or swelling in your joint, decrease the amount until you are comfortable again. Then slowly increase the exercises. Call your caregiver if you have problems or questions.  ° °HOME CARE INSTRUCTIONS  °Remove items at home which could result in a fall. This includes throw rugs or furniture in walking pathways.  °· ICE to the affected hip every three hours for 30 minutes at a time and then as needed for pain and swelling.  Continue to use ice on the hip for pain and swelling from surgery. You may notice swelling that will progress down to the foot and ankle.  This is normal after surgery.  Elevate the leg when you are not up walking on it.   °· Continue to use the breathing machine which will help keep your temperature down.  It is common for your temperature to cycle up and down following surgery, especially at night when you are not up moving around and exerting yourself.  The breathing machine keeps your lungs expanded and your temperature down. ° ° °DIET °You may resume your previous home diet once your are discharged from the hospital. ° °DRESSING / WOUND CARE / SHOWERING °You may shower 3 days after surgery, but keep the wounds dry during showering.  You may use an occlusive plastic wrap (Press'n Seal for example), NO SOAKING/SUBMERGING IN THE BATHTUB.  If the bandage gets wet, change with a clean dry gauze.  If the incision gets wet, pat the wound dry with a clean towel. °You may start showering once you are discharged home but do not  submerge the incision under water. Just pat the incision dry and apply a dry gauze dressing on daily. °Change the surgical dressing daily and reapply a dry dressing each time. ° °ACTIVITY °Walk with your walker as instructed. °Use walker as long as suggested by your caregivers. °Avoid periods of inactivity such as sitting longer than an hour when not asleep. This helps prevent blood clots.  °You may resume a sexual relationship in one month or when given the OK by your doctor.  °You may return to work once you are cleared by your doctor.  °Do not drive a car for 6 weeks or until released by you surgeon.  °Do not drive while taking narcotics. ° °WEIGHT BEARING °Weight bearing as tolerated with assist device (walker, cane, etc) as directed, use it as long as suggested by your surgeon or therapist, typically at least 4-6 weeks. ° °POSTOPERATIVE CONSTIPATION PROTOCOL °Constipation - defined medically as fewer than three stools per week and severe constipation as less than one stool per week. ° °One of the most common issues patients have following surgery is constipation.  Even if you have a regular bowel pattern at home, your normal regimen is likely to be disrupted due to multiple reasons following surgery.  Combination of anesthesia, postoperative narcotics, change in appetite and fluid intake all can affect your bowels.  In order to avoid complications following surgery, here are some recommendations in order to help you during your recovery period. ° °Colace (docusate) - Pick up an over-the-counter   form of Colace or another stool softener and take twice a day as long as you are requiring postoperative pain medications.  Take with a full glass of water daily.  If you experience loose stools or diarrhea, hold the colace until you stool forms back up.  If your symptoms do not get better within 1 week or if they get worse, check with your doctor. ° °Dulcolax (bisacodyl) - Pick up over-the-counter and take as directed  by the product packaging as needed to assist with the movement of your bowels.  Take with a full glass of water.  Use this product as needed if not relieved by Colace only.  ° °MiraLax (polyethylene glycol) - Pick up over-the-counter to have on hand.  MiraLax is a solution that will increase the amount of water in your bowels to assist with bowel movements.  Take as directed and can mix with a glass of water, juice, soda, coffee, or tea.  Take if you go more than two days without a movement. °Do not use MiraLax more than once per day. Call your doctor if you are still constipated or irregular after using this medication for 7 days in a row. ° °If you continue to have problems with postoperative constipation, please contact the office for further assistance and recommendations.  If you experience "the worst abdominal pain ever" or develop nausea or vomiting, please contact the office immediatly for further recommendations for treatment. ° °ITCHING ° If you experience itching with your medications, try taking only a single pain pill, or even half a pain pill at a time.  You can also use Benadryl over the counter for itching or also to help with sleep.  ° °TED HOSE STOCKINGS °Wear the elastic stockings on both legs for three weeks following surgery during the day but you may remove then at night for sleeping. ° °MEDICATIONS °See your medication summary on the “After Visit Summary” that the nursing staff will review with you prior to discharge.  You may have some home medications which will be placed on hold until you complete the course of blood thinner medication.  It is important for you to complete the blood thinner medication as prescribed by your surgeon.  Continue your approved medications as instructed at time of discharge. ° °PRECAUTIONS °If you experience chest pain or shortness of breath - call 911 immediately for transfer to the hospital emergency department.  °If you develop a fever greater that 101 F,  purulent drainage from wound, increased redness or drainage from wound, foul odor from the wound/dressing, or calf pain - CONTACT YOUR SURGEON.   °                                                °FOLLOW-UP APPOINTMENTS °Make sure you keep all of your appointments after your operation with your surgeon and caregivers. You should call the office at the above phone number and make an appointment for approximately two weeks after the date of your surgery or on the date instructed by your surgeon outlined in the "After Visit Summary". ° °RANGE OF MOTION AND STRENGTHENING EXERCISES  °These exercises are designed to help you keep full movement of your hip joint. Follow your caregiver's or physical therapist's instructions. Perform all exercises about fifteen times, three times per day or as directed. Exercise both hips, even if you   have had only one joint replacement. These exercises can be done on a training (exercise) mat, on the floor, on a table or on a bed. Use whatever works the best and is most comfortable for you. Use music or television while you are exercising so that the exercises are a pleasant break in your day. This will make your life better with the exercises acting as a break in routine you can look forward to.  Lying on your back, slowly slide your foot toward your buttocks, raising your knee up off the floor. Then slowly slide your foot back down until your leg is straight again.  Lying on your back spread your legs as far apart as you can without causing discomfort.  Lying on your side, raise your upper leg and foot straight up from the floor as far as is comfortable. Slowly lower the leg and repeat.  Lying on your back, tighten up the muscle in the front of your thigh (quadriceps muscles). You can do this by keeping your leg straight and trying to raise your heel off the floor. This helps strengthen the largest muscle supporting your knee.  Lying on your back, tighten up the muscles of your  buttocks both with the legs straight and with the knee bent at a comfortable angle while keeping your heel on the floor.   IF YOU ARE TRANSFERRED TO A SKILLED REHAB FACILITY If the patient is transferred to a skilled rehab facility following release from the hospital, a list of the current medications will be sent to the facility for the patient to continue.  When discharged from the skilled rehab facility, please have the facility set up the patient's [place] prior to being released. Also, the skilled facility will be responsible for providing the patient with their medications at time of release from the facility to include their pain medication, the muscle relaxants, and their blood thinner medication. If the patient is still at the rehab facility at time of the two week follow up appointment, the skilled rehab facility will also need to assist the patient in arranging follow up appointment in our office and any transportation needs.  MAKE SURE YOU:  Understand these instructions.  Get help right away if you are not doing well or get worse.    Pick up stool softner and laxative for home use following surgery while on pain medications. Do not submerge incision under water. Please use good hand washing techniques while changing dressing each day. May shower starting three days after surgery. Please use a clean towel to pat the incision dry following showers. Continue to use ice for pain and swelling after surgery. Do not use any lotions or creams on the incision until instructed by your surgeon.  Take Xarelto for two and a half more weeks, then discontinue Xarelto. Once the patient has completed the Xarelto, they may resume the 81 mg Aspirin. May also resume the vitamins and supplements once you have completed the Xarelto.  Information on my medicine - XARELTO (Rivaroxaban)  This medication education was reviewed with me or my healthcare representative as part of my  discharge preparation.  The pharmacist that spoke with me during my hospital stay was:  Luiz Ochoa [facility]  Why was Xarelto prescribed for you? Xarelto was prescribed for you to reduce the risk of blood clots forming after orthopedic surgery. The medical term for these abnormal blood clots is venous thromboembolism (VTE).  What do you need to know about  xarelto ? Take your Xarelto ONCE DAILY at the same time every day. You may take it either with or without food.  If you have difficulty swallowing the tablet whole, you may crush it and mix in applesauce just prior to taking your dose.  Take Xarelto exactly as prescribed by your doctor and DO NOT stop taking Xarelto without talking to the doctor who prescribed the medication.  Stopping without other VTE prevention medication to take the place of Xarelto may increase your risk of developing a clot.  After discharge, you should have regular check-up appointments with your healthcare provider that is prescribing your Xarelto.    What do you do if you miss a dose? If you miss a dose, take it as soon as you remember on the same day then continue your regularly scheduled once daily regimen the next day. Do not take two doses of Xarelto on the same day.   Important Safety Information A possible side effect of Xarelto is bleeding. You should call your healthcare provider right away if you experience any of the following: ? Bleeding from an injury or your nose that does not stop. ? Unusual colored urine (red or dark brown) or unusual colored stools (red or black). ? Unusual bruising for unknown reasons. ? A serious fall or if you hit your head (even if there is no bleeding).  Some medicines may interact with Xarelto and might increase your risk of bleeding while on Xarelto. To help avoid this, consult your healthcare provider or pharmacist prior to using any new prescription or non-prescription medications, including herbals, vitamins,  non-steroidal anti-inflammatory drugs (NSAIDs) and supplements.  This website has more information on Xarelto: [email or web address]

## 2014-08-27 NOTE — Progress Notes (Signed)
Physical Therapy Treatment Patient Details Name: Haley Schneider MRN: [id number] DOB: 1935-09-20 Today's Date: September 21, 2014    History of Present Illness R THR; s/p L THR ~ 7 yrs    PT Comments    Pt motivated but ltd this am by c/o increased pain and intermittent spasms  Follow Up Recommendations  Home health PT     Equipment Recommendations  None recommended by PT    Recommendations for Other Services OT consult     Precautions / Restrictions Precautions Precautions: Fall Restrictions Weight Bearing Restrictions: No Other Position/Activity Restrictions: WBAT    Mobility  Bed Mobility               General bed mobility comments: OOB with OT - pt declines back to bed  Transfers Overall transfer level: Needs assistance Equipment used: Rolling walker (2 wheeled) Transfers: Sit to/from Stand Sit to Stand: Min assist         General transfer comment: cues for LE management and use of UEs to self assist  Ambulation/Gait Ambulation/Gait assistance: Min assist Ambulation Distance (Feet): 64 Feet Assistive device: Rolling walker (2 wheeled) Gait Pattern/deviations: Step-to pattern;Decreased step length - right;Decreased step length - left;Shuffle;Trunk flexed Gait velocity: decr   General Gait Details: cues for sequence, posture, ER on R, and position from [employer]    Modified Rankin (Stroke Patients Only)       Balance Overall balance assessment: Needs assistance Sitting-balance support: No upper extremity supported Sitting balance-Leahy Scale: Good     Standing balance support: Bilateral upper extremity supported Standing balance-Leahy Scale: Poor                      Cognition Arousal/Alertness: Awake/alert Behavior During Therapy: WFL for tasks assessed/performed Overall Cognitive Status: Within Functional Limits for tasks assessed                      Exercises      General  Comments        Pertinent Vitals/Pain Pain Assessment: 0-10 Pain Score: 6  Pain Location: R hip Pain Descriptors / Indicators: Aching;Sore;Spasm Pain Intervention(s): Limited activity within patient's tolerance;Premedicated before session;Monitored during session;Ice applied    Home Living                      Prior Function            PT Goals (current goals can now be found in the care plan section) Acute Rehab PT Goals Patient Stated Goal: Walk without pain PT Goal Formulation: With patient Time For Goal Achievement: 08/29/14 Potential to Achieve Goals: Good Progress towards PT goals: Progressing toward goals    Frequency  7X/week    PT Plan Current plan remains appropriate    Co-evaluation             End of Session Equipment Utilized During Treatment: Gait belt Activity Tolerance: Patient limited by fatigue;Patient limited by pain Patient left: in chair;with call bell/phone within reach     Time: 1110-1140 PT Time Calculation (min) (ACUTE ONLY): 30 min  Charges:  $Gait Training: 23-37 mins                    G Codes:      Cristiano Capri 09-21-14, 3:51 PM

## 2014-08-28 LAB — BASIC METABOLIC PANEL
Anion gap: 6 (ref 5–15)
BUN: 9 mg/dL (ref 6–20)
CO2: 29 mmol/L (ref 22–32)
CREATININE: 0.47 mg/dL (ref 0.44–1.00)
Calcium: 10.1 mg/dL (ref 8.9–10.3)
Chloride: 103 mmol/L (ref 101–111)
GFR calc Af Amer: >60 mL/min (ref >60)
GLUCOSE: 95 mg/dL (ref 65–99)
POTASSIUM: 4.2 mmol/L (ref 3.5–5.1)
Sodium: 138 mmol/L (ref 135–145)

## 2014-08-28 LAB — CBC
HCT: 36.4 % (ref 36.0–46.0)
Hemoglobin: 12.3 g/dL (ref 12.0–15.0)
MCH: 29.5 pg (ref 26.0–34.0)
MCHC: 33.8 g/dL (ref 30.0–36.0)
MCV: 87.3 fL (ref 78.0–100.0)
PLATELETS: 298 10*3/uL (ref 150–400)
RBC: 4.17 MIL/uL (ref 3.87–5.11)
RDW: 14 % (ref 11.5–15.5)
WBC: 13.5 10*3/uL — ABNORMAL HIGH (ref 4.0–10.5)

## 2014-08-28 MED ORDER — RIVAROXABAN 10 MG PO TABS: 10.0000 mg | ORAL_TABLET | Freq: Every day | ORAL | Status: DC

## 2014-08-28 MED ORDER — TRAMADOL HCL 50 MG PO TABS: 50.0000 mg | ORAL_TABLET | Freq: Four times a day (QID) | ORAL | Status: DC | PRN

## 2014-08-28 MED ORDER — CYCLOBENZAPRINE HCL 10 MG PO TABS: 10.0000 mg | ORAL_TABLET | Freq: Three times a day (TID) | ORAL | Status: DC | PRN

## 2014-08-28 MED ORDER — OXYCODONE HCL 5 MG PO TABS: 5.0000 mg | ORAL_TABLET | ORAL | Status: DC | PRN

## 2014-08-28 NOTE — Progress Notes (Signed)
   Subjective: 2 Days Post-Op Procedure(s) (LRB): RIGHT TOTAL HIP ARTHROPLASTY ANTERIOR APPROACH (Right) Patient reports pain as mild and moderate.   Patient seen in rounds for Dr. Wynelle Link. Patient is well, but has had some minor complaints of pain in the hip and thigh, requiring pain medications Patient is ready to go home following therapy.  Objective: Vital signs in last 24 hours: Temp:  [98.4 F (36.9 C)] 98.4 F (36.9 C) (08/26 0540) Pulse Rate:  [61-68] 67 (08/26 0540) Resp:  [15-16] 15 (08/26 0540) BP: (135-179)/(54-71) 179/71 mmHg (08/26 0540) SpO2:  [97 %-100 %] 100 % (08/26 0540)  Intake/Output from previous day:  Intake/Output Summary (Last 24 hours) at 08/28/14 0832 Last data filed at 08/28/14 0541  Gross per 24 hour  Intake   1200 ml  Output   1045 ml  Net    155 ml    Labs:  Recent Labs  08/27/14 0420 08/28/14 0545  HGB 10.9* 12.3    Recent Labs  08/27/14 0420 08/28/14 0545  WBC 10.2 13.5*  RBC 3.68* 4.17  HCT 32.1* 36.4  PLT 271 298    Recent Labs  08/27/14 0420 08/28/14 0545  NA 135 138  K 3.9 4.2  CL 103 103  CO2 27 29  BUN 8 9  CREATININE 0.60 0.47  GLUCOSE 124* 95  CALCIUM 9.2 10.1   No results for input(s): LABPT, INR in the last 72 hours.  EXAM: General - Patient is Alert, Appropriate and Oriented Extremity - Neurovascular intact Sensation intact distally Dorsiflexion/Plantar flexion intact Incision - clean, dry, no drainage, healing Motor Function - intact, moving foot and toes well on exam.   Assessment/Plan: 2 Days Post-Op Procedure(s) (LRB): RIGHT TOTAL HIP ARTHROPLASTY ANTERIOR APPROACH (Right) Procedure(s) (LRB): RIGHT TOTAL HIP ARTHROPLASTY ANTERIOR APPROACH (Right) Past Medical History  Diagnosis Date  . OP (osteoporosis)   . Menopause   . Fibroid, uterine   . Hypercholesteremia   . History of abnormal cervical Pap smear   . Hypothyroid   . Hypertension   . Vaginal vault prolapse   . Vaginal atrophy   .  Urethral caruncle   . History of urinary tract infection   . Pessary maintenance     # 5 ring with support  . Female bladder prolapse   . PONV (postoperative nausea and vomiting)   . Urinary urgency   . History of frequent urinary tract infections   . Urinary incontinence   . Nocturia   . GERD (gastroesophageal reflux disease)   . Arthritis    Principal Problem:   OA (osteoarthritis) of hip  Estimated body mass index is 24.9 kg/(m^2) as calculated from the following:   Height as of this encounter: 5' 5.5" (1.664 m).   Weight as of this encounter: 68.947 kg (152 lb). Up with therapy Discharge home with home health Diet - Cardiac diet Follow up - in 2 weeks Activity - WBAT Disposition - Home Condition Upon Discharge - Good D/C Meds - See DC Summary DVT Prophylaxis - Xarelto  Arlee Muslim, PA-C Orthopaedic Surgery 08/28/2014, 8:32 AM

## 2014-08-28 NOTE — Discharge Summary (Signed)
Physician Discharge Summary   Patient ID: Haley Schneider MRN: [id number] DOB/AGE: 1935-11-03 79 y.o.  Admit date: 08/26/2014 Discharge date: 08/28/2014  Primary Diagnosis:  Osteoarthritis of the Right hip.   Admission Diagnoses:  Past Medical History  Diagnosis Date  . OP (osteoporosis)   . Menopause   . Fibroid, uterine   . Hypercholesteremia   . History of abnormal cervical Pap smear   . Hypothyroid   . Hypertension   . Vaginal vault prolapse   . Vaginal atrophy   . Urethral caruncle   . History of urinary tract infection   . Pessary maintenance     # 5 ring with support  . Female bladder prolapse   . PONV (postoperative nausea and vomiting)   . Urinary urgency   . History of frequent urinary tract infections   . Urinary incontinence   . Nocturia   . GERD (gastroesophageal reflux disease)   . Arthritis    Discharge Diagnoses:   Principal Problem:   OA (osteoarthritis) of hip  Estimated body mass index is 24.9 kg/(m^2) as calculated from the following:   Height as of this encounter: 5' 5.5" (1.664 m).   Weight as of this encounter: 68.947 kg (152 lb).  Procedure(s) (LRB): RIGHT TOTAL HIP ARTHROPLASTY ANTERIOR APPROACH (Right)   Consults: None  HPI: Haley Schneider is a 79 y.o. female who has advanced end-  stage arthritis of her Right hip with progressively worsening pain and  dysfunction.The patient has failed nonoperative management and presents for  total hip arthroplasty.   Laboratory Data: Admission on 08/26/2014  Component Date Value Ref Range Status  . WBC 08/27/2014 10.2  4.0 - 10.5 K/uL Final  . RBC 08/27/2014 3.68* 3.87 - 5.11 MIL/uL Final  . Hemoglobin 08/27/2014 10.9* 12.0 - 15.0 g/dL Final  . HCT 08/27/2014 32.1* 36.0 - 46.0 % Final  . MCV 08/27/2014 87.2  78.0 - 100.0 fL Final  . MCH 08/27/2014 29.6  26.0 - 34.0 pg Final  . MCHC 08/27/2014 34.0  30.0 - 36.0 g/dL Final  . RDW 08/27/2014 13.7  11.5 - 15.5 % Final  . Platelets 08/27/2014  271  150 - 400 K/uL Final  . Sodium 08/27/2014 135  135 - 145 mmol/L Final  . Potassium 08/27/2014 3.9  3.5 - 5.1 mmol/L Final  . Chloride 08/27/2014 103  101 - 111 mmol/L Final  . CO2 08/27/2014 27  22 - 32 mmol/L Final  . Glucose, Bld 08/27/2014 124* 65 - 99 mg/dL Final  . BUN 08/27/2014 8  6 - 20 mg/dL Final  . Creatinine, Ser 08/27/2014 0.60  0.44 - 1.00 mg/dL Final  . Calcium 08/27/2014 9.2  8.9 - 10.3 mg/dL Final  . GFR calc non Af Amer 08/27/2014 >60  >60 mL/min Final  . GFR calc Af Amer 08/27/2014 >60  >60 mL/min Final   Comment: (NOTE) The eGFR has been calculated using the CKD EPI equation. This calculation has not been validated in all clinical situations. eGFR's persistently <60 mL/min signify possible Chronic Kidney Disease.   . Anion gap 08/27/2014 5  5 - 15 Final  . WBC 08/28/2014 13.5* 4.0 - 10.5 K/uL Final  . RBC 08/28/2014 4.17  3.87 - 5.11 MIL/uL Final  . Hemoglobin 08/28/2014 12.3  12.0 - 15.0 g/dL Final  . HCT 08/28/2014 36.4  36.0 - 46.0 % Final  . MCV 08/28/2014 87.3  78.0 - 100.0 fL Final  . MCH 08/28/2014 29.5  26.0 - 34.0 pg  Final  . MCHC 08/28/2014 33.8  30.0 - 36.0 g/dL Final  . RDW 08/28/2014 14.0  11.5 - 15.5 % Final  . Platelets 08/28/2014 298  150 - 400 K/uL Final  . Sodium 08/28/2014 138  135 - 145 mmol/L Final  . Potassium 08/28/2014 4.2  3.5 - 5.1 mmol/L Final  . Chloride 08/28/2014 103  101 - 111 mmol/L Final  . CO2 08/28/2014 29  22 - 32 mmol/L Final  . Glucose, Bld 08/28/2014 95  65 - 99 mg/dL Final  . BUN 08/28/2014 9  6 - 20 mg/dL Final  . Creatinine, Ser 08/28/2014 0.47  0.44 - 1.00 mg/dL Final  . Calcium 08/28/2014 10.1  8.9 - 10.3 mg/dL Final  . GFR calc non Af Amer 08/28/2014 >60  >60 mL/min Final  . GFR calc Af Amer 08/28/2014 >60  >60 mL/min Final   Comment: (NOTE) The eGFR has been calculated using the CKD EPI equation. This calculation has not been validated in all clinical situations. eGFR's persistently <60 mL/min signify  possible Chronic Kidney Disease.   Georgiann Hahn gap 08/28/2014 6  5 - 15 Final  Hospital Outpatient Visit on 08/20/2014  Component Date Value Ref Range Status  . MRSA, PCR 08/20/2014 NEGATIVE  NEGATIVE Final  . Staphylococcus aureus 08/20/2014 POSITIVE* NEGATIVE Final   Comment:        The Xpert SA Assay (FDA approved for NASAL specimens in patients over 80 years of age), is one component of a comprehensive surveillance program.  Test performance has been validated by [facility] for patients greater than or equal to 10 year old. It is not intended to diagnose infection nor to guide or monitor treatment.   . Sodium 08/20/2014 135  135 - 145 mmol/L Final  . Potassium 08/20/2014 4.1  3.5 - 5.1 mmol/L Final  . Chloride 08/20/2014 100* 101 - 111 mmol/L Final  . CO2 08/20/2014 29  22 - 32 mmol/L Final  . Glucose, Bld 08/20/2014 89  65 - 99 mg/dL Final  . BUN 08/20/2014 10  6 - 20 mg/dL Final  . Creatinine, Ser 08/20/2014 0.58  0.44 - 1.00 mg/dL Final  . Calcium 08/20/2014 10.2  8.9 - 10.3 mg/dL Final  . Total Protein 08/20/2014 6.6  6.5 - 8.1 g/dL Final  . Albumin 08/20/2014 4.0  3.5 - 5.0 g/dL Final  . AST 08/20/2014 17  15 - 41 U/L Final  . ALT 08/20/2014 13* 14 - 54 U/L Final  . Alkaline Phosphatase 08/20/2014 84  38 - 126 U/L Final  . Total Bilirubin 08/20/2014 0.6  0.3 - 1.2 mg/dL Final  . GFR calc non Af Amer 08/20/2014 >60  >60 mL/min Final  . GFR calc Af Amer 08/20/2014 >60  >60 mL/min Final   Comment: (NOTE) The eGFR has been calculated using the CKD EPI equation. This calculation has not been validated in all clinical situations. eGFR's persistently <60 mL/min signify possible Chronic Kidney Disease.   . Anion gap 08/20/2014 6  5 - 15 Final  . ABO/RH(D) 08/20/2014 O NEG   Final  . Antibody Screen 08/20/2014 NEG   Final  . Sample Expiration 08/20/2014 08/29/2014   Final  . Color, Urine 08/20/2014 YELLOW  YELLOW Final  . APPearance 08/20/2014 CLOUDY* CLEAR Final  .  Specific Gravity, Urine 08/20/2014 1.010  1.005 - 1.030 Final  . pH 08/20/2014 6.5  5.0 - 8.0 Final  . Glucose, UA 08/20/2014 NEGATIVE  NEGATIVE mg/dL Final  . Hgb urine dipstick  08/20/2014 NEGATIVE  NEGATIVE Final  . Bilirubin Urine 08/20/2014 NEGATIVE  NEGATIVE Final  . Ketones, ur 08/20/2014 NEGATIVE  NEGATIVE mg/dL Final  . Protein, ur 08/20/2014 NEGATIVE  NEGATIVE mg/dL Final  . Urobilinogen, UA 08/20/2014 0.2  0.0 - 1.0 mg/dL Final  . Nitrite 08/20/2014 NEGATIVE  NEGATIVE Final  . Leukocytes, UA 08/20/2014 SMALL* NEGATIVE Final  . Squamous Epithelial / LPF 08/20/2014 RARE  RARE Final  . WBC, UA 08/20/2014 3-6  <3 WBC/hpf Final  . Bacteria, UA 08/20/2014 RARE  RARE Final  Office Visit on 08/12/2014  Component Date Value Ref Range Status  . Urine Culture, Routine 08/12/2014 Final report   Final  . Urine Culture result 1 08/12/2014 No growth   Final  . WBC 08/12/2014 8.6  3.4 - 10.8 x10E3/uL Final  . RBC 08/12/2014 4.82  3.77 - 5.28 x10E6/uL Final  . Hemoglobin 08/12/2014 13.9  11.1 - 15.9 g/dL Final  . Hematocrit 08/12/2014 40.8  34.0 - 46.6 % Final  . MCV 08/12/2014 85  79 - 97 fL Final  . MCH 08/12/2014 28.8  26.6 - 33.0 pg Final  . MCHC 08/12/2014 34.1  31.5 - 35.7 g/dL Final  . RDW 08/12/2014 14.0  12.3 - 15.4 % Final  . Neutrophils 08/12/2014 65   Final  . Lymphs 08/12/2014 24   Final  . Monocytes 08/12/2014 9   Final  . Eos 08/12/2014 1   Final  . Basos 08/12/2014 1   Final  . Neutrophils Absolute 08/12/2014 5.6  1.4 - 7.0 x10E3/uL Final  . Lymphocytes Absolute 08/12/2014 2.1  0.7 - 3.1 x10E3/uL Final  . Monocytes Absolute 08/12/2014 0.8  0.1 - 0.9 x10E3/uL Final  . EOS (ABSOLUTE) 08/12/2014 0.1  0.0 - 0.4 x10E3/uL Final  . Basophils Absolute 08/12/2014 0.0  0.0 - 0.2 x10E3/uL Final  . Immature Granulocytes 08/12/2014 0   Final  . Immature Grans (Abs) 08/12/2014 0.0  0.0 - 0.1 x10E3/uL Final  . INR 08/12/2014 1.0  0.8 - 1.2 Final   Comment: Reference interval is for  non-anticoagulated patients. Suggested INR therapeutic range for Vitamin K antagonist therapy:    Standard Dose (moderate intensity                   therapeutic range):       2.0 - 3.0    Higher intensity therapeutic range       2.5 - 3.5   . Prothrombin Time 08/12/2014 10.4  9.1 - 12.0 sec Final  . aPTT 08/12/2014 26  24 - 33 sec Final   Comment: This test has not been validated for monitoring unfractionated heparin therapy. aPTT-based therapeutic ranges for unfractionated heparin therapy have not been established. For general guidelines on Heparin monitoring, refer to the [employer].   . Glucose 08/12/2014 90  65 - 99 mg/dL Final  . BUN 08/12/2014 11  8 - 27 mg/dL Final  . Creatinine, Ser 08/12/2014 0.56* 0.57 - 1.00 mg/dL Final  . GFR calc non Af Amer 08/12/2014 90  >59 mL/min/1.73 Final  . GFR calc Af Amer 08/12/2014 104  >59 mL/min/1.73 Final  . BUN/Creatinine Ratio 08/12/2014 20  11 - 26 Final  . Sodium 08/12/2014 132* 134 - 144 mmol/L Final  . Potassium 08/12/2014 4.4  3.5 - 5.2 mmol/L Final  . Chloride 08/12/2014 94* 97 - 108 mmol/L Final  . CO2 08/12/2014 25  18 - 29 mmol/L Final  . Calcium 08/12/2014 10.1  8.7 -  10.3 mg/dL Final  . Phosphorus 08/12/2014 2.7  2.5 - 4.5 mg/dL Final  . WBC, UA 08/12/2014 0-5  0 -  5 /hpf Final  . RBC, UA 08/12/2014 0-2  0 -  2 /hpf Final  . Epithelial Cells (non renal) 08/12/2014 0-10  0 - 10 /hpf Final  . Bacteria, UA 08/12/2014 Few  None seen/Few Final     X-Rays:Dg Pelvis Portable  08/26/2014   CLINICAL DATA:  Portable postoperative study following right total hip joint replacement.  EXAM: PORTABLE PELVIS 1-2 VIEWS  COMPARISON:  Portable AP view of the pelvis of October 28, 2007.  FINDINGS: There is a pre-existing left hip joint prosthesis. On the right a total hip prosthesis has been placed. Radiographic positioning of the prosthetic components is good. The interface with the native bone is normal. A surgical drain line  is present.  IMPRESSION: Status post right total hip joint prosthesis placement without evidence of immediate postprocedure complication.   Electronically Signed   By: David  Martinique M.D.   On: 08/26/2014 09:40   Dg C-arm 1-60 Min-no Report  08/26/2014   CLINICAL DATA: surgery   C-ARM 1-60 MINUTES  Fluoroscopy was utilized by the requesting physician.  No radiographic  interpretation.     EKG: Orders placed or performed in visit on 08/12/14  . EKG 12-Lead     Hospital Course: Patient was admitted to [facility] and taken to the OR and underwent the above state procedure without complications.  Patient tolerated the procedure well and was later transferred to the recovery room and then to the orthopaedic floor for postoperative care.  They were given PO and IV analgesics for pain control following their surgery.  They were given 24 hours of postoperative antibiotics of  Anti-infectives    Start     Dose/Rate Route Frequency Ordered Stop   08/26/14 1300  ceFAZolin (ANCEF) IVPB 2 g/50 mL premix     2 g 100 mL/hr over 30 Minutes Intravenous Every 6 hours 08/26/14 1105 08/26/14 1925   08/26/14 0510  ceFAZolin (ANCEF) IVPB 2 g/50 mL premix     2 g 100 mL/hr over 30 Minutes Intravenous On call to O.R. 08/26/14 0510 08/26/14 0720     and started on DVT prophylaxis in the form of Xarelto.   PT and OT were ordered for total hip protocol.  The patient was allowed to be WBAT with therapy. Discharge planning was consulted to help with postop disposition and equipment needs.  Patient had a decent night on the evening of surgery.  She had some pain but did get up and walk 18 feet the day of surgery.  They started to get up OOB with therapy on day one again with therapy.  Hemovac drain was pulled without difficulty.  Continued to work with therapy into day two.  Dressing was changed on day two and the incision was healing well.   Patient was seen in rounds and was ready to go home following therapy on  POD 2.  Discharge home with home health Diet - Cardiac diet Follow up - in 2 weeks Activity - WBAT Disposition - Home Condition Upon Discharge - Good D/C Meds - See DC Summary DVT Prophylaxis - Xarelto  Discharge Instructions    Call MD / Call 911    Complete by:  As directed   If you experience chest pain or shortness of breath, CALL 911 and be transported to the hospital emergency room.  If you develope  a fever above 101 F, pus (white drainage) or increased drainage or redness at the wound, or calf pain, call your surgeon's office.     Change dressing    Complete by:  As directed   You may change your dressing dressing daily with sterile 4 x 4 inch gauze dressing and paper tape.  Do not submerge the incision under water.     Constipation Prevention    Complete by:  As directed   Drink plenty of fluids.  Prune juice may be helpful.  You may use a stool softener, such as Colace (over the counter) 100 mg twice a day.  Use MiraLax (over the counter) for constipation as needed.     Diet - low sodium heart healthy    Complete by:  As directed      Discharge instructions    Complete by:  As directed   Pick up stool softner and laxative for home use following surgery while on pain medications. Do not submerge incision under water. Please use good hand washing techniques while changing dressing each day. May shower starting three days after surgery. Please use a clean towel to pat the incision dry following showers. Continue to use ice for pain and swelling after surgery. Do not use any lotions or creams on the incision until instructed by your surgeon.  Total Hip Protocol.  Take Xarelto for two and a half more weeks, then discontinue Xarelto. Once the patient has completed the Xarelto, they may resume the 81 mg Aspirin.  May also resume the vitamins and supplements once you have completed the Xarelto.  Postoperative Constipation Protocol  Constipation - defined medically as fewer  than three stools per week and severe constipation as less than one stool per week.  One of the most common issues patients have following surgery is constipation.  Even if you have a regular bowel pattern at home, your normal regimen is likely to be disrupted due to multiple reasons following surgery.  Combination of anesthesia, postoperative narcotics, change in appetite and fluid intake all can affect your bowels.  In order to avoid complications following surgery, here are some recommendations in order to help you during your recovery period.  Colace (docusate) - Pick up an over-the-counter form of Colace or another stool softener and take twice a day as long as you are requiring postoperative pain medications.  Take with a full glass of water daily.  If you experience loose stools or diarrhea, hold the colace until you stool forms back up.  If your symptoms do not get better within 1 week or if they get worse, check with your doctor.  Dulcolax (bisacodyl) - Pick up over-the-counter and take as directed by the product packaging as needed to assist with the movement of your bowels.  Take with a full glass of water.  Use this product as needed if not relieved by Colace only.   MiraLax (polyethylene glycol) - Pick up over-the-counter to have on hand.  MiraLax is a solution that will increase the amount of water in your bowels to assist with bowel movements.  Take as directed and can mix with a glass of water, juice, soda, coffee, or tea.  Take if you go more than two days without a movement. Do not use MiraLax more than once per day. Call your doctor if you are still constipated or irregular after using this medication for 7 days in a row.  If you continue to have problems with postoperative constipation, please  contact the office for further assistance and recommendations.  If you experience "the worst abdominal pain ever" or develop nausea or vomiting, please contact the office immediatly for further  recommendations for treatment.     Do not sit on low chairs, stoools or toilet seats, as it may be difficult to get up from low surfaces    Complete by:  As directed      Driving restrictions    Complete by:  As directed   No driving until released by the physician.     Increase activity slowly as tolerated    Complete by:  As directed      Lifting restrictions    Complete by:  As directed   No lifting until released by the physician.     Patient may shower    Complete by:  As directed   You may shower without a dressing once there is no drainage.  Do not wash over the wound.  If drainage remains, do not shower until drainage stops.     TED hose    Complete by:  As directed   Use stockings (TED hose) for 3 weeks on both leg(s).  You may remove them at night for sleeping.     Weight bearing as tolerated    Complete by:  As directed   Laterality:  right  Extremity:  Lower            Medication List    STOP taking these medications        aspirin EC 81 MG tablet     b complex-C-E-zinc tablet     Biotin 10 MG Caps     ibuprofen 200 MG tablet  Commonly known as:  ADVIL,MOTRIN     raloxifene 60 MG tablet  Commonly known as:  EVISTA     vitamin A 8000 UNIT capsule     vitamin B-12 100 MCG tablet  Commonly known as:  CYANOCOBALAMIN     vitamin C 500 MG tablet  Commonly known as:  ASCORBIC ACID     Vitamin D3 5000 UNITS Caps     VITAMIN E PO      TAKE these medications        cyclobenzaprine 10 MG tablet  Commonly known as:  FLEXERIL  Take 1 tablet (10 mg total) by mouth 3 (three) times daily as needed for muscle spasms.     levothyroxine 50 MCG tablet  Commonly known as:  SYNTHROID, LEVOTHROID  Take 50 mcg by mouth daily before breakfast.     losartan 50 MG tablet  Commonly known as:  COZAAR  Take 1 tablet (50 mg total) by mouth daily.     oxyCODONE 5 MG immediate release tablet  Commonly known as:  Oxy IR/ROXICODONE  Take 1-2 tablets (5-10 mg total) by  mouth every 3 (three) hours as needed for moderate pain or severe pain.     rivaroxaban 10 MG Tabs tablet  Commonly known as:  XARELTO  Take 1 tablet (10 mg total) by mouth daily with breakfast. Take Xarelto for two and a half more weeks, then discontinue Xarelto. Once the patient has completed the Xarelto, they may resume the 81 mg Aspirin.     traMADol 50 MG tablet  Commonly known as:  ULTRAM  Take 1-2 tablets (50-100 mg total) by mouth every 6 (six) hours as needed (mild pain).           Follow-up Information    Follow up with Gaynelle Arabian  V, MD. Schedule an appointment as soon as possible for a visit on 09/08/2014.   Specialty:  Orthopedic Surgery   Why:  Call office at [phone] to setup appointment on Tuesday 09/08/2014 with Dr. Wynelle Link.   Contact information:   [address] [place] [address] [phone]       Signed: Arlee Muslim, PA-C Orthopaedic Surgery 08/28/2014, 8:44 AM

## 2014-08-28 NOTE — Progress Notes (Signed)
Physical Therapy Treatment Patient Details Name: Haley Schneider MRN: [id number] DOB: 07/18/1935 Today's Date: 08/28/2014    History of Present Illness R THR; s/p L THR ~ 7 yrs    PT Comments    Pt progressing well with mobility and eager for return home with family assist and HHPT follow up.  Reviewed stairs and car transfers.  Follow Up Recommendations  Home health PT     Equipment Recommendations  None recommended by PT    Recommendations for Other Services OT consult     Precautions / Restrictions Precautions Precautions: Fall Restrictions Weight Bearing Restrictions: No Other Position/Activity Restrictions: WBAT    Mobility  Bed Mobility           Sit to supine: Min assist   General bed mobility comments: Pt OOB with OT - declines back to bed  Transfers Overall transfer level: Needs assistance Equipment used: Rolling walker (2 wheeled) Transfers: Sit to/from Stand Sit to Stand: Min guard         General transfer comment: for safety:  cues for LE placement when sitting  Ambulation/Gait Ambulation/Gait assistance: Min guard;Supervision Ambulation Distance (Feet): 200 Feet Assistive device: Rolling walker (2 wheeled) Gait Pattern/deviations: Step-to pattern;Step-through pattern;Decreased step length - right;Decreased step length - left;Shuffle;Trunk flexed Gait velocity: decr   General Gait Details: cues for sequence, posture, ER on R, and position from RW   Stairs Stairs: Yes Stairs assistance: Min assist Stair Management: One rail Right;Step to pattern;Forwards;With cane Number of Stairs: 7 General stair comments: cues for sequence and foot/cane placement  Wheelchair Mobility    Modified Rankin (Stroke Patients Only)       Balance     Sitting balance-Leahy Scale: Good       Standing balance-Leahy Scale: Poor                      Cognition Arousal/Alertness: Awake/alert Behavior During Therapy: WFL for tasks  assessed/performed Overall Cognitive Status: Within Functional Limits for tasks assessed                      Exercises Total Joint Exercises Ankle Circles/Pumps: AROM;Supine;Both;15 reps Quad Sets: AROM;Both;10 reps;Supine Gluteal Sets: AROM;Both;10 reps;Supine Heel Slides: AAROM;Right;Supine;20 reps Hip ABduction/ADduction: AAROM;Right;Supine;15 reps    General Comments        Pertinent Vitals/Pain Pain Assessment: 0-10 Pain Score: 3  Pain Location: R hip Pain Descriptors / Indicators: Aching;Sore Pain Intervention(s): Limited activity within patient's tolerance;Monitored during session;Premedicated before session    Home Living                      Prior Function            PT Goals (current goals can now be found in the care plan section) Acute Rehab PT Goals Patient Stated Goal: Walk without pain PT Goal Formulation: With patient Time For Goal Achievement: 08/29/14 Potential to Achieve Goals: Good Progress towards PT goals: Progressing toward goals    Frequency  7X/week    PT Plan Current plan remains appropriate    Co-evaluation             End of Session Equipment Utilized During Treatment: Gait belt Activity Tolerance: Patient tolerated treatment well Patient left: in chair;with call bell/phone within reach     Time: 1015-1100 PT Time Calculation (min) (ACUTE ONLY): 45 min  Charges:  $Gait Training: 8-22 mins $Therapeutic Exercise: 8-22 mins $Therapeutic Activity: 8-22 mins  G Codes:      Josede Cicero 2014-09-09, 12:52 PM

## 2014-08-28 NOTE — Care Management Important Message (Signed)
Important Message  Patient Details  Name: Haley Schneider MRN: [id number] Date of Birth: January 07, 1935   Medicare Important Message Given:  Yes-second notification given    Camillo Flaming 08/28/2014, 1:38 PMImportant Message  Patient Details  Name: Haley Schneider MRN: [id number] Date of Birth: 23-Mar-1935   Medicare Important Message Given:  Yes-second notification given    Camillo Flaming 08/28/2014, 1:38 PM

## 2014-08-28 NOTE — Care Management Note (Signed)
Case Management Note  Patient Details  Name: Haley Schneider MRN: [id number] Date of Birth: 19-Aug-1935  Subjective/Objective:                 Right total hip arthroplasty, anterior approach   Action/Plan: Discharge planning, spoke with patient at bedside. Offered choice for [facility] agency, chose [facility]. Contacted [facility] for referral, will follow for d/c needs, orders for HHPT. No DME needs, patient has equipment from prior surgery.   Expected Discharge Date:                  Expected Discharge Plan:  [place]  In-House Referral:  NA  Discharge planning Services  CM Consult  Post Acute Care Choice:  NA Choice offered to:  Patient  DME Arranged:  N/A DME Agency:  NA  HH Arranged:  PT [place] Agency:  [facility]  Status of Service:  Completed, signed off  Medicare Important Message Given:    Date Medicare IM Given:    Medicare IM give by:    Date Additional Medicare IM Given:    Additional Medicare Important Message give by:     If discussed at [place] of Stay Meetings, dates discussed:    Additional Comments:  Guadalupe Maple, RN 08/28/2014, 9:42 AM

## 2014-08-28 NOTE — Progress Notes (Signed)
Occupational Therapy Treatment Patient Details Name: Haley Schneider MRN: [id number] DOB: 06/05/1935 Today's Date: 08/28/2014    History of present illness R THR; s/p L THR ~ 7 yrs   OT comments  Goals met in acute setting; no further OT needs  Follow Up Recommendations  Supervision/Assistance - 24 hour    Equipment Recommendations  None recommended by OT    Recommendations for Other Services      Precautions / Restrictions Precautions Precautions: Fall Restrictions Other Position/Activity Restrictions: WBAT       Mobility Bed Mobility           Sit to supine: Min assist   General bed mobility comments: assist for RLE  Transfers   Equipment used: Rolling walker (2 wheeled) Transfers: Stand Pivot Transfers Sit to Stand: Min guard         General transfer comment: for safety:  cues for LE placement when sitting    Balance                                   ADL       Grooming: Wash/dry hands;Wash/dry face;Oral care;Brushing hair;Standing;Supervision/safety                   Toilet Transfer: Min guard;Ambulation;BSC;RW             General ADL Comments: min guard for hygiene, sit to stand.  Needed extra time to initiate moving LLE to start then continued on without difficulty      Vision                     Perception     Praxis      Cognition   Behavior During Therapy: [facility] for tasks assessed/performed Overall Cognitive Status: Within Functional Limits for tasks assessed                       Extremity/Trunk Assessment               Exercises     Shoulder Instructions       General Comments      Pertinent Vitals/ Pain       Pain Score: 4  Pain Location: R hip Pain Descriptors / Indicators: Sore Pain Intervention(s): Limited activity within patient's tolerance;Monitored during session;Premedicated before session;Repositioned (removed ice)  Home Living                                           Prior Functioning/Environment              Frequency       Progress Toward Goals  OT Goals(current goals can now be found in the care plan section)  Progress towards OT goals: Goals met/education completed, patient discharged from OT     Plan      Co-evaluation                 End of Session     Activity Tolerance Patient tolerated treatment well   Patient Left in chair;with call bell/phone within reach   Nurse Communication          Time: 5465-0354 OT Time Calculation (min): 19 min  Charges: OT General Charges $OT Visit: 1 Procedure OT Treatments $Self Care/Home Management : 8-22  mins  Atha Mcbain 08/28/2014, 9:29 AM Lesle Chris, OTR/L [phone] 08/28/2014

## 2014-08-29 DIAGNOSIS — Z7901 Long term (current) use of anticoagulants: Secondary | ICD-10-CM | POA: Diagnosis not present

## 2014-08-29 DIAGNOSIS — Z96642 Presence of left artificial hip joint: Secondary | ICD-10-CM | POA: Diagnosis not present

## 2014-08-29 DIAGNOSIS — Z9181 History of falling: Secondary | ICD-10-CM | POA: Diagnosis not present

## 2014-08-29 DIAGNOSIS — M810 Age-related osteoporosis without current pathological fracture: Secondary | ICD-10-CM | POA: Diagnosis not present

## 2014-08-29 DIAGNOSIS — Z96641 Presence of right artificial hip joint: Secondary | ICD-10-CM | POA: Diagnosis not present

## 2014-08-29 DIAGNOSIS — Z471 Aftercare following joint replacement surgery: Secondary | ICD-10-CM | POA: Diagnosis not present

## 2014-08-29 DIAGNOSIS — M25551 Pain in right hip: Secondary | ICD-10-CM | POA: Diagnosis not present

## 2014-08-29 DIAGNOSIS — I10 Essential (primary) hypertension: Secondary | ICD-10-CM | POA: Diagnosis not present

## 2014-08-31 DIAGNOSIS — Z471 Aftercare following joint replacement surgery: Secondary | ICD-10-CM | POA: Diagnosis not present

## 2014-08-31 DIAGNOSIS — Z9181 History of falling: Secondary | ICD-10-CM | POA: Diagnosis not present

## 2014-08-31 DIAGNOSIS — Z7901 Long term (current) use of anticoagulants: Secondary | ICD-10-CM | POA: Diagnosis not present

## 2014-08-31 DIAGNOSIS — Z96641 Presence of right artificial hip joint: Secondary | ICD-10-CM | POA: Diagnosis not present

## 2014-08-31 DIAGNOSIS — M25551 Pain in right hip: Secondary | ICD-10-CM | POA: Diagnosis not present

## 2014-08-31 DIAGNOSIS — I10 Essential (primary) hypertension: Secondary | ICD-10-CM | POA: Diagnosis not present

## 2014-08-31 DIAGNOSIS — Z96642 Presence of left artificial hip joint: Secondary | ICD-10-CM | POA: Diagnosis not present

## 2014-08-31 DIAGNOSIS — M810 Age-related osteoporosis without current pathological fracture: Secondary | ICD-10-CM | POA: Diagnosis not present

## 2014-09-01 DIAGNOSIS — Z471 Aftercare following joint replacement surgery: Secondary | ICD-10-CM | POA: Diagnosis not present

## 2014-09-01 DIAGNOSIS — M25551 Pain in right hip: Secondary | ICD-10-CM | POA: Diagnosis not present

## 2014-09-01 DIAGNOSIS — Z96641 Presence of right artificial hip joint: Secondary | ICD-10-CM | POA: Diagnosis not present

## 2014-09-01 DIAGNOSIS — I10 Essential (primary) hypertension: Secondary | ICD-10-CM | POA: Diagnosis not present

## 2014-09-01 DIAGNOSIS — Z96642 Presence of left artificial hip joint: Secondary | ICD-10-CM | POA: Diagnosis not present

## 2014-09-01 DIAGNOSIS — Z9181 History of falling: Secondary | ICD-10-CM | POA: Diagnosis not present

## 2014-09-01 DIAGNOSIS — Z7901 Long term (current) use of anticoagulants: Secondary | ICD-10-CM | POA: Diagnosis not present

## 2014-09-01 DIAGNOSIS — M810 Age-related osteoporosis without current pathological fracture: Secondary | ICD-10-CM | POA: Diagnosis not present

## 2014-09-03 ENCOUNTER — Other Ambulatory Visit: Payer: Self-pay

## 2014-09-03 DIAGNOSIS — M25551 Pain in right hip: Secondary | ICD-10-CM | POA: Diagnosis not present

## 2014-09-03 DIAGNOSIS — I10 Essential (primary) hypertension: Secondary | ICD-10-CM | POA: Diagnosis not present

## 2014-09-03 DIAGNOSIS — Z471 Aftercare following joint replacement surgery: Secondary | ICD-10-CM | POA: Diagnosis not present

## 2014-09-03 DIAGNOSIS — Z96642 Presence of left artificial hip joint: Secondary | ICD-10-CM | POA: Diagnosis not present

## 2014-09-03 DIAGNOSIS — Z96641 Presence of right artificial hip joint: Secondary | ICD-10-CM | POA: Diagnosis not present

## 2014-09-03 DIAGNOSIS — Z7901 Long term (current) use of anticoagulants: Secondary | ICD-10-CM | POA: Diagnosis not present

## 2014-09-03 DIAGNOSIS — Z9181 History of falling: Secondary | ICD-10-CM | POA: Diagnosis not present

## 2014-09-03 DIAGNOSIS — M810 Age-related osteoporosis without current pathological fracture: Secondary | ICD-10-CM | POA: Diagnosis not present

## 2014-09-03 MED ORDER — RALOXIFENE HCL 60 MG PO TABS: 60.0000 mg | ORAL_TABLET | Freq: Every day | ORAL | Status: DC

## 2014-09-03 NOTE — Telephone Encounter (Signed)
Routing to provider  

## 2014-09-07 DIAGNOSIS — Z7901 Long term (current) use of anticoagulants: Secondary | ICD-10-CM | POA: Diagnosis not present

## 2014-09-07 DIAGNOSIS — Z9181 History of falling: Secondary | ICD-10-CM | POA: Diagnosis not present

## 2014-09-07 DIAGNOSIS — I10 Essential (primary) hypertension: Secondary | ICD-10-CM | POA: Diagnosis not present

## 2014-09-07 DIAGNOSIS — Z96641 Presence of right artificial hip joint: Secondary | ICD-10-CM | POA: Diagnosis not present

## 2014-09-07 DIAGNOSIS — M25551 Pain in right hip: Secondary | ICD-10-CM | POA: Diagnosis not present

## 2014-09-07 DIAGNOSIS — Z471 Aftercare following joint replacement surgery: Secondary | ICD-10-CM | POA: Diagnosis not present

## 2014-09-07 DIAGNOSIS — M810 Age-related osteoporosis without current pathological fracture: Secondary | ICD-10-CM | POA: Diagnosis not present

## 2014-09-07 DIAGNOSIS — Z96642 Presence of left artificial hip joint: Secondary | ICD-10-CM | POA: Diagnosis not present

## 2014-09-09 DIAGNOSIS — Z9181 History of falling: Secondary | ICD-10-CM | POA: Diagnosis not present

## 2014-09-09 DIAGNOSIS — Z471 Aftercare following joint replacement surgery: Secondary | ICD-10-CM | POA: Diagnosis not present

## 2014-09-09 DIAGNOSIS — Z96642 Presence of left artificial hip joint: Secondary | ICD-10-CM | POA: Diagnosis not present

## 2014-09-09 DIAGNOSIS — I10 Essential (primary) hypertension: Secondary | ICD-10-CM | POA: Diagnosis not present

## 2014-09-09 DIAGNOSIS — M25551 Pain in right hip: Secondary | ICD-10-CM | POA: Diagnosis not present

## 2014-09-09 DIAGNOSIS — Z7901 Long term (current) use of anticoagulants: Secondary | ICD-10-CM | POA: Diagnosis not present

## 2014-09-09 DIAGNOSIS — M810 Age-related osteoporosis without current pathological fracture: Secondary | ICD-10-CM | POA: Diagnosis not present

## 2014-09-09 DIAGNOSIS — Z96641 Presence of right artificial hip joint: Secondary | ICD-10-CM | POA: Diagnosis not present

## 2014-09-11 DIAGNOSIS — Z471 Aftercare following joint replacement surgery: Secondary | ICD-10-CM | POA: Diagnosis not present

## 2014-09-11 DIAGNOSIS — M25551 Pain in right hip: Secondary | ICD-10-CM | POA: Diagnosis not present

## 2014-09-11 DIAGNOSIS — Z96642 Presence of left artificial hip joint: Secondary | ICD-10-CM | POA: Diagnosis not present

## 2014-09-11 DIAGNOSIS — M810 Age-related osteoporosis without current pathological fracture: Secondary | ICD-10-CM | POA: Diagnosis not present

## 2014-09-11 DIAGNOSIS — I10 Essential (primary) hypertension: Secondary | ICD-10-CM | POA: Diagnosis not present

## 2014-09-11 DIAGNOSIS — Z9181 History of falling: Secondary | ICD-10-CM | POA: Diagnosis not present

## 2014-09-11 DIAGNOSIS — Z96641 Presence of right artificial hip joint: Secondary | ICD-10-CM | POA: Diagnosis not present

## 2014-09-11 DIAGNOSIS — Z7901 Long term (current) use of anticoagulants: Secondary | ICD-10-CM | POA: Diagnosis not present

## 2014-09-15 DIAGNOSIS — M25551 Pain in right hip: Secondary | ICD-10-CM | POA: Diagnosis not present

## 2014-09-15 DIAGNOSIS — Z96642 Presence of left artificial hip joint: Secondary | ICD-10-CM | POA: Diagnosis not present

## 2014-09-15 DIAGNOSIS — Z9181 History of falling: Secondary | ICD-10-CM | POA: Diagnosis not present

## 2014-09-15 DIAGNOSIS — I10 Essential (primary) hypertension: Secondary | ICD-10-CM | POA: Diagnosis not present

## 2014-09-15 DIAGNOSIS — Z471 Aftercare following joint replacement surgery: Secondary | ICD-10-CM | POA: Diagnosis not present

## 2014-09-15 DIAGNOSIS — M810 Age-related osteoporosis without current pathological fracture: Secondary | ICD-10-CM | POA: Diagnosis not present

## 2014-09-15 DIAGNOSIS — Z96641 Presence of right artificial hip joint: Secondary | ICD-10-CM | POA: Diagnosis not present

## 2014-09-15 DIAGNOSIS — Z7901 Long term (current) use of anticoagulants: Secondary | ICD-10-CM | POA: Diagnosis not present

## 2014-09-16 DIAGNOSIS — Z471 Aftercare following joint replacement surgery: Secondary | ICD-10-CM | POA: Diagnosis not present

## 2014-09-16 DIAGNOSIS — Z7901 Long term (current) use of anticoagulants: Secondary | ICD-10-CM | POA: Diagnosis not present

## 2014-09-16 DIAGNOSIS — M810 Age-related osteoporosis without current pathological fracture: Secondary | ICD-10-CM | POA: Diagnosis not present

## 2014-09-16 DIAGNOSIS — Z9181 History of falling: Secondary | ICD-10-CM | POA: Diagnosis not present

## 2014-09-16 DIAGNOSIS — M25551 Pain in right hip: Secondary | ICD-10-CM | POA: Diagnosis not present

## 2014-09-16 DIAGNOSIS — Z96642 Presence of left artificial hip joint: Secondary | ICD-10-CM | POA: Diagnosis not present

## 2014-09-16 DIAGNOSIS — I10 Essential (primary) hypertension: Secondary | ICD-10-CM | POA: Diagnosis not present

## 2014-09-16 DIAGNOSIS — Z96641 Presence of right artificial hip joint: Secondary | ICD-10-CM | POA: Diagnosis not present

## 2014-09-21 DIAGNOSIS — Z96642 Presence of left artificial hip joint: Secondary | ICD-10-CM | POA: Diagnosis not present

## 2014-09-21 DIAGNOSIS — Z7901 Long term (current) use of anticoagulants: Secondary | ICD-10-CM | POA: Diagnosis not present

## 2014-09-21 DIAGNOSIS — Z96641 Presence of right artificial hip joint: Secondary | ICD-10-CM | POA: Diagnosis not present

## 2014-09-21 DIAGNOSIS — Z471 Aftercare following joint replacement surgery: Secondary | ICD-10-CM | POA: Diagnosis not present

## 2014-09-21 DIAGNOSIS — I10 Essential (primary) hypertension: Secondary | ICD-10-CM | POA: Diagnosis not present

## 2014-09-21 DIAGNOSIS — Z9181 History of falling: Secondary | ICD-10-CM | POA: Diagnosis not present

## 2014-09-21 DIAGNOSIS — M810 Age-related osteoporosis without current pathological fracture: Secondary | ICD-10-CM | POA: Diagnosis not present

## 2014-09-21 DIAGNOSIS — M25551 Pain in right hip: Secondary | ICD-10-CM | POA: Diagnosis not present

## 2014-09-24 DIAGNOSIS — Z7901 Long term (current) use of anticoagulants: Secondary | ICD-10-CM | POA: Diagnosis not present

## 2014-09-24 DIAGNOSIS — M810 Age-related osteoporosis without current pathological fracture: Secondary | ICD-10-CM | POA: Diagnosis not present

## 2014-09-24 DIAGNOSIS — I10 Essential (primary) hypertension: Secondary | ICD-10-CM | POA: Diagnosis not present

## 2014-09-24 DIAGNOSIS — Z9181 History of falling: Secondary | ICD-10-CM | POA: Diagnosis not present

## 2014-09-24 DIAGNOSIS — Z96641 Presence of right artificial hip joint: Secondary | ICD-10-CM | POA: Diagnosis not present

## 2014-09-24 DIAGNOSIS — M25551 Pain in right hip: Secondary | ICD-10-CM | POA: Diagnosis not present

## 2014-09-24 DIAGNOSIS — Z471 Aftercare following joint replacement surgery: Secondary | ICD-10-CM | POA: Diagnosis not present

## 2014-09-24 DIAGNOSIS — Z96642 Presence of left artificial hip joint: Secondary | ICD-10-CM | POA: Diagnosis not present

## 2014-09-28 DIAGNOSIS — Z471 Aftercare following joint replacement surgery: Secondary | ICD-10-CM | POA: Diagnosis not present

## 2014-09-28 DIAGNOSIS — M25551 Pain in right hip: Secondary | ICD-10-CM | POA: Diagnosis not present

## 2014-09-28 DIAGNOSIS — Z96642 Presence of left artificial hip joint: Secondary | ICD-10-CM | POA: Diagnosis not present

## 2014-09-28 DIAGNOSIS — Z96641 Presence of right artificial hip joint: Secondary | ICD-10-CM | POA: Diagnosis not present

## 2014-09-28 DIAGNOSIS — I10 Essential (primary) hypertension: Secondary | ICD-10-CM | POA: Diagnosis not present

## 2014-09-28 DIAGNOSIS — Z7901 Long term (current) use of anticoagulants: Secondary | ICD-10-CM | POA: Diagnosis not present

## 2014-09-28 DIAGNOSIS — M810 Age-related osteoporosis without current pathological fracture: Secondary | ICD-10-CM | POA: Diagnosis not present

## 2014-09-28 DIAGNOSIS — Z9181 History of falling: Secondary | ICD-10-CM | POA: Diagnosis not present

## 2014-09-29 DIAGNOSIS — Z471 Aftercare following joint replacement surgery: Secondary | ICD-10-CM | POA: Diagnosis not present

## 2014-09-29 DIAGNOSIS — Z96641 Presence of right artificial hip joint: Secondary | ICD-10-CM | POA: Diagnosis not present

## 2014-10-02 DIAGNOSIS — M25551 Pain in right hip: Secondary | ICD-10-CM | POA: Diagnosis not present

## 2014-10-02 DIAGNOSIS — M810 Age-related osteoporosis without current pathological fracture: Secondary | ICD-10-CM | POA: Diagnosis not present

## 2014-10-02 DIAGNOSIS — Z471 Aftercare following joint replacement surgery: Secondary | ICD-10-CM | POA: Diagnosis not present

## 2014-10-02 DIAGNOSIS — Z96642 Presence of left artificial hip joint: Secondary | ICD-10-CM | POA: Diagnosis not present

## 2014-10-02 DIAGNOSIS — I10 Essential (primary) hypertension: Secondary | ICD-10-CM | POA: Diagnosis not present

## 2014-10-02 DIAGNOSIS — Z9181 History of falling: Secondary | ICD-10-CM | POA: Diagnosis not present

## 2014-10-02 DIAGNOSIS — Z96641 Presence of right artificial hip joint: Secondary | ICD-10-CM | POA: Diagnosis not present

## 2014-10-02 DIAGNOSIS — Z7901 Long term (current) use of anticoagulants: Secondary | ICD-10-CM | POA: Diagnosis not present

## 2014-11-03 DIAGNOSIS — M25562 Pain in left knee: Secondary | ICD-10-CM | POA: Diagnosis not present

## 2014-11-04 DIAGNOSIS — H43813 Vitreous degeneration, bilateral: Secondary | ICD-10-CM | POA: Diagnosis not present

## 2014-11-04 DIAGNOSIS — H34832 Tributary (branch) retinal vein occlusion, left eye, with macular edema: Secondary | ICD-10-CM | POA: Diagnosis not present

## 2014-11-27 ENCOUNTER — Other Ambulatory Visit: Payer: Self-pay | Admitting: Family Medicine

## 2014-11-30 NOTE — Telephone Encounter (Signed)
Dr Lada patient-routing to provider. 

## 2014-12-02 DIAGNOSIS — L570 Actinic keratosis: Secondary | ICD-10-CM | POA: Diagnosis not present

## 2014-12-02 DIAGNOSIS — L720 Epidermal cyst: Secondary | ICD-10-CM | POA: Diagnosis not present

## 2014-12-02 DIAGNOSIS — D485 Neoplasm of uncertain behavior of skin: Secondary | ICD-10-CM | POA: Diagnosis not present

## 2014-12-02 DIAGNOSIS — L853 Xerosis cutis: Secondary | ICD-10-CM | POA: Diagnosis not present

## 2014-12-02 DIAGNOSIS — Z1283 Encounter for screening for malignant neoplasm of skin: Secondary | ICD-10-CM | POA: Diagnosis not present

## 2014-12-02 DIAGNOSIS — L578 Other skin changes due to chronic exposure to nonionizing radiation: Secondary | ICD-10-CM | POA: Diagnosis not present

## 2014-12-02 DIAGNOSIS — L821 Other seborrheic keratosis: Secondary | ICD-10-CM | POA: Diagnosis not present

## 2014-12-25 DIAGNOSIS — M1712 Unilateral primary osteoarthritis, left knee: Secondary | ICD-10-CM | POA: Diagnosis not present

## 2015-01-06 ENCOUNTER — Encounter: Payer: Self-pay | Admitting: Obstetrics and Gynecology

## 2015-01-06 ENCOUNTER — Ambulatory Visit (INDEPENDENT_AMBULATORY_CARE_PROVIDER_SITE_OTHER): Payer: Medicare Other | Admitting: Obstetrics and Gynecology

## 2015-01-06 VITALS — BP 124/77 | HR 88 | Ht 65.0 in | Wt 149.4 lb

## 2015-01-06 DIAGNOSIS — N952 Postmenopausal atrophic vaginitis: Secondary | ICD-10-CM | POA: Diagnosis not present

## 2015-01-06 DIAGNOSIS — IMO0002 Reserved for concepts with insufficient information to code with codable children: Secondary | ICD-10-CM

## 2015-01-06 DIAGNOSIS — N8110 Cystocele, unspecified: Secondary | ICD-10-CM | POA: Diagnosis not present

## 2015-01-06 NOTE — Progress Notes (Signed)
Chief complaint: 1.  Cystocele.  Patient is a 80 year old female with history of vaginal prolapse (cystocele), who presents for pessary insertion.  Pessary had been left out in the second half of last year due to the patient undergoing a hip replacement surgery.  She is now interested in having the pessary reinserted. Previously patient had been using [place] gel weekly.  She was not using estrogen cream intravaginally. No current vaginal bleeding, odor, or pain. Bowel function is normal. Bladder function is normal.  Past medical history, past surgical history, problem list, medications, and allergies are reviewed.  Review of systems: Per HPI.  OBJECTIVE: BP 124/77 mmHg  Pulse 88  Ht 5\' 5"  (1.651 m)  Wt 149 lb 6.4 oz (67.767 kg)  BMI 24.86 kg/m2 HENT: Normocephalic, atraumatic.  SKIN: Skin is warm and dry. No rash noted. Not diaphoretic. No erythema. No pallor. [place]: Alert and oriented to person, place, and time. PSYCHIATRIC: Normal mood and affect. Normal behavior. Normal judgment and thought content. CARDIOVASCULAR:Not Examined RESPIRATORY: Not Examined BREASTS: Not Examined ABDOMEN: Soft, non distended; Non tender. No Organomegaly. PELVIC: External Genitalia: Normal BUS: Normal Vagina: Atrophic changes with minimal posterior vaginal wall friability Cervix: Absent Uterus: Absent Adnexa: N/A RV: N/A Bladder: Nontender  ASSESSMENT: 1.  Cystocele. 2.  Vaginal atrophy.  PLAN: 1.  #5 ring with support pessary is inserted 2.  TRIMO San gel intravaginal weekly. 3.  Return in 6 weeks for follow-up.

## 2015-01-06 NOTE — Patient Instructions (Signed)
1.  Insert.  [place] gel weekly. 2.  Return in 6 weeks for pessary check.

## 2015-01-23 ENCOUNTER — Other Ambulatory Visit: Payer: Self-pay | Admitting: Family Medicine

## 2015-01-24 NOTE — Telephone Encounter (Signed)
Rx approved

## 2015-02-17 ENCOUNTER — Encounter: Payer: Self-pay | Admitting: Obstetrics and Gynecology

## 2015-02-17 ENCOUNTER — Ambulatory Visit (INDEPENDENT_AMBULATORY_CARE_PROVIDER_SITE_OTHER): Payer: Medicare Other | Admitting: Obstetrics and Gynecology

## 2015-02-17 VITALS — BP 146/73 | HR 87 | Ht 65.5 in | Wt 151.9 lb

## 2015-02-17 DIAGNOSIS — N8110 Cystocele, unspecified: Secondary | ICD-10-CM | POA: Diagnosis not present

## 2015-02-17 DIAGNOSIS — B3731 Acute candidiasis of vulva and vagina: Secondary | ICD-10-CM

## 2015-02-17 DIAGNOSIS — IMO0002 Reserved for concepts with insufficient information to code with codable children: Secondary | ICD-10-CM

## 2015-02-17 DIAGNOSIS — B373 Candidiasis of vulva and vagina: Secondary | ICD-10-CM | POA: Diagnosis not present

## 2015-02-17 NOTE — Patient Instructions (Signed)
1.  Diflucan 150 mg by mouth times one dose. 2.  Return in 3 months for pessary maintenance. 3.  Continue using [place] gel weekly

## 2015-02-17 NOTE — Progress Notes (Signed)
Chief complaint: 1.  Pessary maintenance. 2.  Serial 3.  Vaginal atrophy.  Patient presents for 6 week pessary maintenance evaluation.  She is using the #5 ring with support pessary.  Patient denies vaginal bleeding, vaginal discharge, vaginal odor. She is usingTRIMO SAN Gel weekly.  Past medical history, past surgical history, problem list, medications, and allergies are reviewed.  Review of systems: Per HPI.  OBJECTIVE: BP 146/73 mmHg  Pulse 87  Ht 5' 5.5" (1.664 m)  Wt 151 lb 14.4 oz (68.901 kg)  BMI 24.88 kg/m2 Pleasant elderly female in no acute distress.  She is alert and oriented.  Affect is appropriate. PELVIC: External Genitalia: Normal BUS: Normal Vagina: Atrophic changes; Thick white discharge present; no epithelial ulceration Cervix: Absent Uterus: Absent Adnexa: N/A RV: N/A Bladder: Nontender  PROCEDURE: Wet prep. KOH-negative. Normal saline-negative.  PROCEDURE: Pessary is removed, cleaned, and reinserted  ASSESSMENT: 1.  Cystocele. 2.  Possible monilia vaginitis. 3.  Normal pessary maintenance.  PLAN: 1.  Diflucan 150 mg orally. 2.  Continue TRIMO San gel weekly 3.  Return in 3 months for follow-up  Brayton Mars, MD  Note: This dictation was prepared with Dragon dictation along with smaller phrase technology. Any transcriptional errors that result from this process are unintentional.

## 2015-02-22 ENCOUNTER — Telehealth: Payer: Self-pay | Admitting: Obstetrics and Gynecology

## 2015-02-22 MED ORDER — FLUCONAZOLE 150 MG PO TABS: 150.0000 mg | ORAL_TABLET | Freq: Every day | ORAL | Status: DC

## 2015-02-22 NOTE — Telephone Encounter (Signed)
Pt aware med erx. apologized for the inconvenience.

## 2015-02-22 NOTE — Telephone Encounter (Signed)
Patients diflucan was never called in at her visit on 2/15.

## 2015-03-10 ENCOUNTER — Ambulatory Visit (INDEPENDENT_AMBULATORY_CARE_PROVIDER_SITE_OTHER): Payer: Medicare Other | Admitting: Family Medicine

## 2015-03-10 ENCOUNTER — Encounter: Payer: Self-pay | Admitting: Family Medicine

## 2015-03-10 VITALS — BP 130/85 | HR 89 | Ht 64.75 in | Wt 149.0 lb

## 2015-03-10 DIAGNOSIS — Z23 Encounter for immunization: Secondary | ICD-10-CM

## 2015-03-10 DIAGNOSIS — Z1239 Encounter for other screening for malignant neoplasm of breast: Secondary | ICD-10-CM | POA: Diagnosis not present

## 2015-03-10 DIAGNOSIS — M8580 Other specified disorders of bone density and structure, unspecified site: Secondary | ICD-10-CM

## 2015-03-10 DIAGNOSIS — Z0000 Encounter for general adult medical examination without abnormal findings: Secondary | ICD-10-CM | POA: Diagnosis not present

## 2015-03-10 NOTE — Assessment & Plan Note (Signed)
Check DEXA; fall prevention, three servings of calcium a day; vit D3 1000 iu daily

## 2015-03-10 NOTE — Progress Notes (Signed)
Patient: Haley Schneider, Female    DOB: 10-04-1935, 80 y.o.   MRN: [id number]  Visit Date: 03/10/2015  Today's Provider: Enid Derry, MD   Chief Complaint  Patient presents with  . Annual Exam    Medicare Wellness    Subjective:   Haley Schneider is a 80 y.o. female who presents today for her Subsequent Annual Wellness Visit.  Caregiver input:  N/a  USPSTF grade A and B recommendations Alcohol: no Depression: Depression screen [facility] 2/9 03/10/2015 08/12/2014  Decreased Interest 0 0  Down, Depressed, Hopeless 0 0  PHQ - 2 Score 0 0    Hypertension: controlled Obesity: healthy weight Tobacco use: never smoker HIV, hep B, hep C: politely declined STD testing and prevention (chl/gon/syphilis): declined Lipids: reviewed July 2016 off of medicine Glucose: glucose 89 6 months Colorectal cancer: above age 52 Breast cancer: gets yearly Cervical cancer screening: graduated Lung cancer: never smoker Osteoporosis: worried about evista; wants to stop Fall prevention/vitamin D: discussed AAA: n/a Aspirin: taking 81 mg daily Diet: eating too much she says; lots of sweets Exercise: not very active, with bad left knee Skin cancer: had something removed by derm recently right arm; covering with bandaid  HPI  Review of Systems  Musculoskeletal: Positive for arthralgias (knee pain, had series of three shots in the left knee by ortho; limits ambulation; uses cane).   Past Medical History  Diagnosis Date  . OP (osteoporosis)   . Menopause   . Fibroid, uterine   . Hypercholesteremia   . History of abnormal cervical Pap smear   . Hypothyroid   . Hypertension   . Vaginal vault prolapse   . Vaginal atrophy   . Urethral caruncle   . History of urinary tract infection   . Pessary maintenance     # 5 ring with support  . Female bladder prolapse   . PONV (postoperative nausea and vomiting)   . Urinary urgency   . History of frequent urinary tract infections   . Urinary incontinence    . Nocturia   . GERD (gastroesophageal reflux disease)   . Arthritis     Past Surgical History  Procedure Laterality Date  . Hernia repair    . Tonsillectomy    . Thyroidectomy    . Total hip arthroplasty      left hip 7 years ago   . Cataract extraction    . Thyroglossal duct cyst    . Eye surgery      cataract surgery bilat   . Tonsillectomy    . Total hip arthroplasty Right 08/26/2014    Procedure: RIGHT TOTAL HIP ARTHROPLASTY ANTERIOR APPROACH;  Surgeon: Gaynelle Arabian, MD;  Location: [facility];  Service: Orthopedics;  Laterality: Right;    Family History  Problem Relation Age of Onset  . Alzheimer's disease Mother   . Cancer Father     lung  . Hypertension Father   . Diabetes Son   . Stroke Paternal Grandfather   . Heart disease Neg Hx     Social History   Social History  . Marital Status: Widowed    Spouse Name: N/A  . Number of Children: N/A  . Years of Education: N/A   Occupational History  . Not on file.   Social History Main Topics  . Smoking status: Never Smoker   . Smokeless tobacco: Never Used  . Alcohol Use: No  . Drug Use: No  . Sexual Activity: Yes    Birth Control/ Protection: Surgical  Other Topics Concern  . Not on file   Social History Narrative    Outpatient Encounter Prescriptions as of 03/10/2015  Medication Sig  . aspirin EC 81 MG tablet Take 81 mg by mouth daily.  Marland Kitchen losartan (COZAAR) 50 MG tablet Take 1 tablet (50 mg total) by mouth daily.  . Multiple Vitamin (MULTIVITAMIN) tablet Take 1 tablet by mouth daily.  . raloxifene (EVISTA) 60 MG tablet TAKE ONE (1) TABLET EACH DAY  . SYNTHROID 50 MCG tablet TAKE ONE (1) TABLET EACH DAY  . traMADol (ULTRAM) 50 MG tablet Take 1-2 tablets (50-100 mg total) by mouth every 6 (six) hours as needed (mild pain).  . [DISCONTINUED] fluconazole (DIFLUCAN) 150 MG tablet Take 1 tablet (150 mg total) by mouth daily. (Patient not taking: Reported on 03/10/2015)   No facility-administered encounter  medications on file as of 03/10/2015.    Functional Ability / Safety Screening 1.  Was the timed Get Up and Go test longer than 30 seconds?  no 2.  Does the patient need help with the phone, transportation, shopping,      preparing meals, housework, laundry, medications, or managing money?  no 3.  Does the patient's home have:  loose throw rugs in the hallway?   yes      Grab bars in the bathroom? no      Handrails on the stairs?   yes      Poor lighting?   no 4.  Has the patient noticed any hearing difficulties?   no  Fall Risk Assessment See under rooming   Depression Screen See under rooming Depression screen [facility] 2/9 03/10/2015 08/12/2014  Decreased Interest 0 0  Down, Depressed, Hopeless 0 0  PHQ - 2 Score 0 0    Advanced Directives Does patient have a HCPOA?    yes If yes, name and contact information: Darletta Moll [phone], Leota Batta [phone] Does patient have a living will or MOST form?  yes  Objective:   Vitals: BP 130/85 mmHg  Pulse 89  Ht 5' 4.75" (1.645 m)  Wt 149 lb (67.586 kg)  BMI 24.98 kg/m2  SpO2 99% Body mass index is 24.98 kg/(m^2).  Visual Acuity Screening   Right eye Left eye Both eyes  Without correction: 20/20 20/50   With correction:       Physical Exam  Constitutional: She appears well-developed and well-nourished. No distress.  Musculoskeletal:       Left knee: She exhibits decreased range of motion.  Neurological: Gait (ambulatory with cane, limited ROM of left knee, bears more weight on right leg) abnormal.  Psychiatric: She has a normal mood and affect. Cognition and memory are normal.   Mood/affect:  euthymic Appearance:  Neatly dressed  Cognitive Testing - 6-CIT  Correct? Score   What year is it? yes 0 Yes = 0    No = 4  What month is it? yes 0 Yes = 0    No = 3  Remember:     Pia Mau, [place], [place]     What time is it? yes 0 Yes = 0    No = 3  Count backwards from 20 to 1 yes 0 Correct = 0    1 error =  2   More than 1 error = 4  Say the months of the year in reverse. yes 0 Correct = 0    1 error = 2   More than 1 error = 4  What  address did I ask you to remember? yes 0 Correct = 0  1 error = 2    2 error = 4    3 error = 6    4 error = 8    All wrong = 10       TOTAL SCORE  0/28   Interpretation:  Normal  Normal (0-7) Abnormal (8-28)    Assessment & Plan:     Annual Wellness Visit  Reviewed patient's Family Medical History Reviewed and updated list of patient's medical providers Assessment of cognitive impairment was done Assessed patient's functional ability Established a written schedule for health screening [place] Completed and Reviewed  Immunization History  Administered Date(s) Administered  . Influenza-Unspecified 12/11/2013  declined flu vaccine this season  Health Maintenance  Topic Date Due  . INFLUENZA VACCINE  08/12/2015 (Originally 08/03/2014)  . DEXA SCAN  08/12/2015 (Originally 03/04/2002)  . TETANUS/TDAP  08/12/2015 (Originally 03/03/1956)  . PNA vac Low Risk Adult (1 of 2 - PCV13) 08/12/2015 (Originally 03/04/2002)  . ZOSTAVAX  Addressed  she refused flu vaccine PCV-13 today  Discussed health benefits of physical activity, and encouraged her to engage in regular exercise appropriate for her age and condition.   Meds ordered this encounter  Medications  . aspirin EC 81 MG tablet    Sig: Take 81 mg by mouth daily.  . Multiple Vitamin (MULTIVITAMIN) tablet    Sig: Take 1 tablet by mouth daily.    Current outpatient prescriptions:  .  aspirin EC 81 MG tablet, Take 81 mg by mouth daily., Disp: , Rfl:  .  losartan (COZAAR) 50 MG tablet, Take 1 tablet (50 mg total) by mouth daily., Disp: 90 tablet, Rfl: 3 .  Multiple Vitamin (MULTIVITAMIN) tablet, Take 1 tablet by mouth daily., Disp: , Rfl:  .  raloxifene (EVISTA) 60 MG tablet, TAKE ONE (1) TABLET EACH DAY, Disp: 30 tablet, Rfl: 5 .  SYNTHROID 50 MCG tablet, TAKE ONE (1) TABLET EACH DAY,  Disp: 90 tablet, Rfl: 2 .  traMADol (ULTRAM) 50 MG tablet, Take 1-2 tablets (50-100 mg total) by mouth every 6 (six) hours as needed (mild pain)., Disp: 80 tablet, Rfl: 1 Medications Discontinued During This Encounter  Medication Reason  . fluconazole (DIFLUCAN) 150 MG tablet Completed Course    Next Medicare Wellness Visit in 12+ months  An after-visit summary was printed and given to the patient at [facility].  Please see the patient instructions which may contain other information and recommendations beyond what is mentioned above in the assessment and plan.

## 2015-03-10 NOTE — Patient Instructions (Addendum)
Take your aspirin one hour or more before your first dose of ibuprofen to help it work better Try antibacterial ointment on the site on your right arm Do talk with your orthopaedist about physical therapy; we don't want you to fall and break a hip Either get rid of the throw rugs or affix safely with nonslip pads I do recommend having grab bars installed for bathroom safety Do see your eye doctor regularly Please do call to schedule your mammogram and bone density test; the number to schedule one at either [facility] or [facility] is [phone] You have received the Prevnar vaccine (PCV-13) and you will not need another booster of this for the rest of your life per current ACIP guidelines You can have the Pneumovax vaccine (PPSV-23) in 6-12 months, and once you have that, you will not need any more pneumonia vaccines for the rest of your life Okay to stop bone medicine and we'll see what you DEXA scan shows

## 2015-05-19 ENCOUNTER — Other Ambulatory Visit: Payer: Self-pay | Admitting: Family Medicine

## 2015-05-19 ENCOUNTER — Ambulatory Visit: Payer: Medicare Other | Admitting: Obstetrics and Gynecology

## 2015-05-19 ENCOUNTER — Ambulatory Visit
Admission: RE | Admit: 2015-05-19 | Discharge: 2015-05-19 | Disposition: A | Discharge status: Home / Self Care | Payer: Medicare Other | Source: Ambulatory Visit | Attending: Family Medicine | Admitting: Family Medicine

## 2015-05-19 DIAGNOSIS — Z1231 Encounter for screening mammogram for malignant neoplasm of breast: Secondary | ICD-10-CM | POA: Diagnosis present

## 2015-05-26 ENCOUNTER — Ambulatory Visit: Payer: Medicare Other | Admitting: Obstetrics and Gynecology

## 2015-05-26 ENCOUNTER — Other Ambulatory Visit: Payer: Self-pay | Admitting: Family Medicine

## 2015-05-26 NOTE — Telephone Encounter (Signed)
Last Cr and K+ Aug 2016 reviewed; Rx approved

## 2015-06-02 ENCOUNTER — Ambulatory Visit (INDEPENDENT_AMBULATORY_CARE_PROVIDER_SITE_OTHER): Payer: Medicare Other | Admitting: Obstetrics and Gynecology

## 2015-06-02 ENCOUNTER — Encounter: Payer: Self-pay | Admitting: Obstetrics and Gynecology

## 2015-06-02 VITALS — BP 123/76 | HR 84 | Ht 65.5 in | Wt 152.8 lb

## 2015-06-02 DIAGNOSIS — N8110 Cystocele, unspecified: Secondary | ICD-10-CM

## 2015-06-02 DIAGNOSIS — N8111 Cystocele, midline: Secondary | ICD-10-CM | POA: Insufficient documentation

## 2015-06-02 DIAGNOSIS — R351 Nocturia: Secondary | ICD-10-CM | POA: Diagnosis not present

## 2015-06-02 DIAGNOSIS — R102 Pelvic and perineal pain: Secondary | ICD-10-CM | POA: Diagnosis not present

## 2015-06-02 DIAGNOSIS — Z4689 Encounter for fitting and adjustment of other specified devices: Secondary | ICD-10-CM

## 2015-06-02 DIAGNOSIS — IMO0002 Reserved for concepts with insufficient information to code with codable children: Secondary | ICD-10-CM

## 2015-06-02 LAB — POCT URINALYSIS DIPSTICK
BILIRUBIN UA: NEGATIVE
GLUCOSE UA: NEGATIVE
Ketones, UA: NEGATIVE
Nitrite, UA: NEGATIVE
Protein, UA: NEGATIVE
SPEC GRAV UA: 1.01
Urobilinogen, UA: 0.2
pH, UA: 7

## 2015-06-02 NOTE — Patient Instructions (Addendum)
1. Urine culture is obtained today 2. Return in 3 months for pessary maintenance 3. Continue with Trimosan gel weekly

## 2015-06-02 NOTE — Progress Notes (Signed)
Chief complaint: 1. Pessary maintenance 2. Cystocele 3. Vaginal atrophy  Patient presents for 6 week pessary maintenance evaluation. She is using the #5 ring with support pessary. Patient denies vaginal bleeding, vaginal discharge, vaginal odor. She is using Trimosan gel weekly.  Patient does report urinary frequency and urgency at night having nocturia 3 times per night she is having to wear pull-ups to bed. She denies other UTI symptoms.  OBJECTIVE: BP 123/76 mmHg  Pulse 84  Ht 5' 5.5" (1.664 m)  Wt 152 lb 12.8 oz (69.31 kg)  BMI 25.03 kg/m2 Pleasant elderly female in no acute distress. She is alert and oriented. Affect is appropriate. PELVIC: External Genitalia: Normal BUS: Normal Vagina: Atrophic changes;  no epithelial ulceration Cervix: Absent Uterus: Absent Adnexa: N/A RV: N/A Bladder: Nontender PROCEDURE: Pessary is removed, cleaned, and reinserted  ASSESSMENT: 1. Cystocele 2. Normal pessary maintenance 3. Nocturia; cannot rule out UTI  PLAN: 1. Continue Trimosan gel weekly 2. Urine culture 3. Return in 3 months for follow-up  A total of 15 minutes were spent face-to-face with the patient during this encounter and over half of that time dealt with counseling and coordination of care.  Brayton Mars, MD  Note: This dictation was prepared with Dragon dictation along with smaller phrase technology. Any transcriptional errors that result from this process are unintentional.

## 2015-06-04 LAB — URINE CULTURE

## 2015-07-26 ENCOUNTER — Other Ambulatory Visit: Payer: Self-pay | Admitting: Family Medicine

## 2015-07-26 NOTE — Telephone Encounter (Signed)
Your patient 

## 2015-07-27 NOTE — Telephone Encounter (Signed)
Patient needs labs please and a visit with me in the next month; I'll send Rx in but we're due to discuss her thyroid and check labs

## 2015-07-27 NOTE — Telephone Encounter (Signed)
LMOM to schedule an appt.

## 2015-08-30 ENCOUNTER — Other Ambulatory Visit: Payer: Self-pay | Admitting: Family Medicine

## 2015-08-30 NOTE — Telephone Encounter (Signed)
Patient has appt this week; we'll check TSH then She shouldn't need a refill I just approved 90 pills on July 25th; please resolve with pharmacy

## 2015-08-30 NOTE — Telephone Encounter (Signed)
Last Cr and K+ August 2016; she has appt in two days; will check labs then; okay for limited refill

## 2015-08-30 NOTE — Telephone Encounter (Signed)
Your patient 

## 2015-09-01 ENCOUNTER — Encounter: Payer: Self-pay | Admitting: Family Medicine

## 2015-09-01 ENCOUNTER — Ambulatory Visit (INDEPENDENT_AMBULATORY_CARE_PROVIDER_SITE_OTHER): Payer: Medicare Other | Admitting: Family Medicine

## 2015-09-01 DIAGNOSIS — Z5181 Encounter for therapeutic drug level monitoring: Secondary | ICD-10-CM | POA: Diagnosis not present

## 2015-09-01 DIAGNOSIS — R350 Frequency of micturition: Secondary | ICD-10-CM

## 2015-09-01 DIAGNOSIS — I10 Essential (primary) hypertension: Secondary | ICD-10-CM

## 2015-09-01 DIAGNOSIS — E785 Hyperlipidemia, unspecified: Secondary | ICD-10-CM

## 2015-09-01 DIAGNOSIS — R102 Pelvic and perineal pain unspecified side: Secondary | ICD-10-CM | POA: Insufficient documentation

## 2015-09-01 DIAGNOSIS — M8580 Other specified disorders of bone density and structure, unspecified site: Secondary | ICD-10-CM

## 2015-09-01 DIAGNOSIS — E890 Postprocedural hypothyroidism: Secondary | ICD-10-CM

## 2015-09-01 DIAGNOSIS — N9489 Other specified conditions associated with female genital organs and menstrual cycle: Secondary | ICD-10-CM

## 2015-09-01 LAB — CBC WITH DIFFERENTIAL/PLATELET
BASOS PCT: 0 %
Basophils Absolute: 0 cells/uL (ref 0–200)
EOS ABS: 88 {cells}/uL (ref 15–500)
Eosinophils Relative: 1 %
HEMATOCRIT: 42.6 % (ref 35.0–45.0)
HEMOGLOBIN: 14.1 g/dL (ref 11.7–15.5)
LYMPHS ABS: 2024 {cells}/uL (ref 850–3900)
Lymphocytes Relative: 23 %
MCH: 27.9 pg (ref 27.0–33.0)
MCHC: 33.1 g/dL (ref 32.0–36.0)
MCV: 84.4 fL (ref 80.0–100.0)
MONO ABS: 704 {cells}/uL (ref 200–950)
MPV: 10.2 fL (ref 7.5–12.5)
Monocytes Relative: 8 %
NEUTROS PCT: 68 %
Neutro Abs: 5984 cells/uL (ref 1500–7800)
Platelets: 341 10*3/uL (ref 140–400)
RBC: 5.05 MIL/uL (ref 3.80–5.10)
RDW: 14.6 % (ref 11.0–15.0)
WBC: 8.8 10*3/uL (ref 3.8–10.8)

## 2015-09-01 LAB — COMPLETE METABOLIC PANEL WITH GFR
ALT: 10 U/L (ref 6–29)
AST: 14 U/L (ref 10–35)
Albumin: 4 g/dL (ref 3.6–5.1)
Alkaline Phosphatase: 85 U/L (ref 33–130)
BUN: 10 mg/dL (ref 7–25)
CHLORIDE: 99 mmol/L (ref 98–110)
CO2: 24 mmol/L (ref 20–31)
Calcium: 10.4 mg/dL (ref 8.6–10.4)
Creat: 0.64 mg/dL (ref 0.60–0.93)
GFR, EST NON AFRICAN AMERICAN: 86 mL/min (ref >=60)
Glucose, Bld: 85 mg/dL (ref 65–99)
Potassium: 4.5 mmol/L (ref 3.5–5.3)
Sodium: 131 mmol/L — ABNORMAL LOW (ref 135–146)
Total Bilirubin: 0.5 mg/dL (ref 0.2–1.2)
Total Protein: 6.8 g/dL (ref 6.1–8.1)

## 2015-09-01 LAB — LIPID PANEL
Cholesterol: 203 mg/dL — ABNORMAL HIGH (ref 125–200)
HDL: 64 mg/dL (ref >=46)
LDL CALC: 107 mg/dL (ref <130)
TRIGLYCERIDES: 162 mg/dL — AB (ref <150)
Total CHOL/HDL Ratio: 3.2 Ratio (ref <=5.0)
VLDL: 32 mg/dL — AB (ref <30)

## 2015-09-01 LAB — TSH: TSH: 1.34 mIU/L

## 2015-09-01 LAB — PHOSPHORUS: PHOSPHORUS: 3 mg/dL (ref 2.1–4.3)

## 2015-09-01 NOTE — Assessment & Plan Note (Signed)
Check labs 

## 2015-09-01 NOTE — Assessment & Plan Note (Signed)
Patient politely declined of DEXA; good fall precautions; vit D supplementation 1000 iu daily

## 2015-09-01 NOTE — Assessment & Plan Note (Signed)
Check TSH 

## 2015-09-01 NOTE — Assessment & Plan Note (Signed)
Check lipids; try to avoid saturated fats

## 2015-09-01 NOTE — Assessment & Plan Note (Signed)
Controlled today; check creatinine; try DASH guidelines

## 2015-09-01 NOTE — Assessment & Plan Note (Signed)
Check urine.

## 2015-09-01 NOTE — Patient Instructions (Addendum)
Your goal blood pressure is less than 150 mmHg on top. Try to follow the DASH guidelines (DASH stands for Dietary Approaches to Stop Hypertension) Try to limit the sodium in your diet.  Ideally, consume less than 1.5 grams (less than 1,500mg ) per day. Do not add salt when cooking or at the table.  Check the sodium amount on labels when shopping, and choose items lower in sodium when given a choice. Avoid or limit foods that already contain a lot of sodium. Eat a diet rich in fruits and vegetables and whole grains. Try to limit saturated fats in your diet (bologna, hot dogs, barbeque, cheeseburgers, hamburgers, steak, bacon, sausage, cheese, etc.) and get more fresh fruits, vegetables, and whole grains Do take 1,000 iu vitamin D3 once a day  Let's get labs If you have not heard anything from my staff in a week about any orders/referrals/studies from today, please contact us here to follow-up [phone]

## 2015-09-01 NOTE — Assessment & Plan Note (Signed)
Check urine; f/u with GYN about the pessary and any ongoing symptoms

## 2015-09-01 NOTE — Progress Notes (Signed)
BP 122/72   Pulse 90   Temp 98.4 F (36.9 C) (Oral)   Resp 14   Wt 152 lb (68.9 kg)   SpO2 96%   BMI 24.91 kg/m    Subjective:    Patient ID: Haley Schneider, female    DOB: 1935-05-19, 80 y.o.   MRN: [id number]  HPI: Haley Schneider is a 80 y.o. female  Chief Complaint  Patient presents with  . Follow-up    Depression screen [facility] 2/9 09/01/2015 03/10/2015 08/12/2014  Decreased Interest 0 0 0  Down, Depressed, Hopeless 0 0 0  PHQ - 2 Score 0 0 0   Patient is here for f/u  No blood in the urine; no burning with urination; no fevers; she has a pessary  Hypothyroidism; stays cold; weight gain  Hypercholesterolemia; does eat some cheese; limiting pork; avoid processed pork; does eat hamburger once a week; getting some oatmeal but maybe not enough whole grains  Getting left knee replacement in October; already had both hips replaced  Relevant past medical, surgical, family and social history reviewed Past Medical History:  Diagnosis Date  . Arthritis   . Female bladder prolapse   . Fibroid, uterine   . GERD (gastroesophageal reflux disease)   . History of abnormal cervical Pap smear   . History of frequent urinary tract infections   . History of urinary tract infection   . Hypercholesteremia   . Hypertension   . Hypothyroid   . Menopause   . Nocturia   . OP (osteoporosis)   . Pessary maintenance    # 5 ring with support  . PONV (postoperative nausea and vomiting)   . Urethral caruncle   . Urinary incontinence   . Urinary urgency   . Vaginal atrophy   . Vaginal vault prolapse    Past Surgical History:  Procedure Laterality Date  . CATARACT EXTRACTION    . EYE SURGERY     cataract surgery bilat   . HERNIA REPAIR    . THYROGLOSSAL DUCT CYST    . THYROIDECTOMY    . TONSILLECTOMY    . TONSILLECTOMY    . TOTAL HIP ARTHROPLASTY     left hip 7 years ago   . TOTAL HIP ARTHROPLASTY Right 08/26/2014   Procedure: RIGHT TOTAL HIP ARTHROPLASTY ANTERIOR APPROACH;   Surgeon: Gaynelle Arabian, MD;  Location: [facility];  Service: Orthopedics;  Laterality: Right;   Family History  Problem Relation Age of Onset  . Alzheimer's disease Mother   . Cancer Father     lung  . Hypertension Father   . Diabetes Son   . Stroke Paternal Grandfather   . Heart disease Neg Hx   . Breast cancer Paternal Aunt    Social History  Substance Use Topics  . Smoking status: Never Smoker  . Smokeless tobacco: Never Used  . Alcohol use No    Interim medical history since last visit reviewed. Allergies and medications reviewed  Review of Systems Per HPI unless specifically indicated above     Objective:    BP 122/72   Pulse 90   Temp 98.4 F (36.9 C) (Oral)   Resp 14   Wt 152 lb (68.9 kg)   SpO2 96%   BMI 24.91 kg/m   Wt Readings from Last 3 Encounters:  09/01/15 152 lb (68.9 kg)  06/02/15 152 lb 12.8 oz (69.3 kg)  03/10/15 149 lb (67.6 kg)    Physical Exam  Constitutional: She appears well-developed and well-nourished.  No distress.  HENT:  Head: Normocephalic and atraumatic.  Eyes: EOM are normal. No scleral icterus.  Neck: No thyromegaly present.  Cardiovascular: Normal rate, regular rhythm and normal heart sounds.   No murmur heard. Pulmonary/Chest: Effort normal and breath sounds normal. No respiratory distress. She has no wheezes.  Thoracic kyphosis  Abdominal: Soft. Bowel sounds are normal. She exhibits no distension.  Musculoskeletal: She exhibits no edema.  Neurological: She is alert. She exhibits normal muscle tone.  Skin: Skin is warm and dry. She is not diaphoretic. No pallor.  Psychiatric: She has a normal mood and affect. Her speech is normal and behavior is normal. Judgment and thought content normal. Her mood appears not anxious. Cognition and memory are normal. She does not exhibit a depressed mood.      Assessment & Plan:   Problem List Items Addressed This Visit      Cardiovascular and Mediastinum   Hypertension    Controlled  today; check creatinine; try DASH guidelines        Endocrine   Hypothyroidism    Check TSH      Relevant Orders   TSH (Completed)     Musculoskeletal and Integument   Osteopenia    Patient politely declined of DEXA; good fall precautions; vit D supplementation 1000 iu daily        Genitourinary   Pelvic pressure in female    Check urine; f/u with GYN about the pessary and any ongoing symptoms        Other   Urinary frequency    Check urine      Relevant Orders   Urinalysis w microscopic + reflex cultur (Completed)   Medication monitoring encounter    Check labs      Relevant Orders   CBC with Differential/Platelet (Completed)   COMPLETE METABOLIC PANEL WITH GFR (Completed)   Hyperlipidemia    Check lipids; try to avoid saturated fats      Relevant Orders   Lipid panel (Completed)   Hypercalcemia    Check vit D, phos, Ca2+      Relevant Orders   Phosphorus (Completed)   VITAMIN D 25 Hydroxy (Vit-D Deficiency, Fractures) (Completed)    Other Visit Diagnoses   None.      Follow up plan: Return in about 6 months (around 03/01/2016) for follow-up; Medicare wellness when due.  An after-visit summary was printed and given to the patient at [place].  Please see the patient instructions which may contain other information and recommendations beyond what is mentioned above in the assessment and plan.  Orders Placed This Encounter  Procedures  . Urine culture  . CBC with Differential/Platelet  . Lipid panel  . COMPLETE METABOLIC PANEL WITH GFR  . TSH  . Phosphorus  . VITAMIN D 25 Hydroxy (Vit-D Deficiency, Fractures)  . Urinalysis w microscopic + reflex cultur

## 2015-09-01 NOTE — Assessment & Plan Note (Signed)
Check vit D, phos, Ca2+ 

## 2015-09-02 ENCOUNTER — Other Ambulatory Visit: Payer: Self-pay

## 2015-09-02 DIAGNOSIS — R3129 Other microscopic hematuria: Secondary | ICD-10-CM

## 2015-09-02 DIAGNOSIS — E871 Hypo-osmolality and hyponatremia: Secondary | ICD-10-CM

## 2015-09-02 LAB — URINALYSIS W MICROSCOPIC + REFLEX CULTURE
Bacteria, UA: NONE SEEN [HPF]
Bilirubin Urine: NEGATIVE
Casts: NONE SEEN [LPF]
Crystals: NONE SEEN [HPF]
GLUCOSE, UA: NEGATIVE
Hgb urine dipstick: NEGATIVE
Ketones, ur: NEGATIVE
NITRITE: NEGATIVE
PH: 7 (ref 5.0–8.0)
PROTEIN: NEGATIVE
Specific Gravity, Urine: 1.012 (ref 1.001–1.035)
YEAST: NONE SEEN [HPF]

## 2015-09-02 LAB — VITAMIN D 25 HYDROXY (VIT D DEFICIENCY, FRACTURES): VIT D 25 HYDROXY: 34 ng/mL (ref 30–100)

## 2015-09-03 LAB — URINE CULTURE

## 2015-09-08 ENCOUNTER — Encounter: Payer: Self-pay | Admitting: Obstetrics and Gynecology

## 2015-09-08 ENCOUNTER — Ambulatory Visit (INDEPENDENT_AMBULATORY_CARE_PROVIDER_SITE_OTHER): Payer: Medicare Other | Admitting: Obstetrics and Gynecology

## 2015-09-08 VITALS — BP 131/75 | HR 88 | Ht 65.0 in | Wt 151.3 lb

## 2015-09-08 DIAGNOSIS — N8110 Cystocele, unspecified: Secondary | ICD-10-CM

## 2015-09-08 DIAGNOSIS — N952 Postmenopausal atrophic vaginitis: Secondary | ICD-10-CM | POA: Diagnosis not present

## 2015-09-08 DIAGNOSIS — Z4689 Encounter for fitting and adjustment of other specified devices: Secondary | ICD-10-CM

## 2015-09-08 DIAGNOSIS — N898 Other specified noninflammatory disorders of vagina: Secondary | ICD-10-CM | POA: Diagnosis not present

## 2015-09-08 DIAGNOSIS — T8389XA Other specified complication of genitourinary prosthetic devices, implants and grafts, initial encounter: Secondary | ICD-10-CM | POA: Diagnosis not present

## 2015-09-08 DIAGNOSIS — IMO0002 Reserved for concepts with insufficient information to code with codable children: Secondary | ICD-10-CM

## 2015-09-08 NOTE — Patient Instructions (Signed)
1. Leave pessary out for 2 weeks 2. Return in 2 weeks for probable pessary insertion prior to knee surgery

## 2015-09-08 NOTE — Progress Notes (Signed)
Chief complaint: 1. Pessary maintenance 2. Cystocele 3. Vaginal atrophy  Patient presents for 12 week pessary maintenance evaluation. She is using the #5 ring with support pessary. Patient denies vaginal bleeding, vaginal discharge, vaginal odor. She is using Trimosan gel weekly. Last weekend physical which demonstrated hematuria  OBJECTIVE: BP 131/75   Pulse 88   Ht 5\' 5"  (1.651 m)   Wt 151 lb 4.8 oz (68.6 kg)   BMI 25.18 kg/m  Pleasant elderly female in no acute distress. She is alert and oriented. Affect is appropriate. PELVIC: External Genitalia: Normal BUS: Normal Vagina: Atrophic changes;  no epithelial ulceration; mild posterior vagina, proximal  Cervix: Absent Uterus: Absent Adnexa: N/A RV: N/A Bladder: Nontender PROCEDURE: Pessary is removed, cleaned, and not reinserted  ASSESSMENT: 1. Cystocele 2. Normal pessary maintenance 3. Vaginal hyperemia  PLAN: 1. Continue Trimosan gel weekly 2.  Return in 2 weeks for probable pessary insertion prior to the surgery  A total of 15 minutes were spent face-to-face with the patient during this encounter and over half of that time dealt with counseling and coordination of care.  Brayton Mars, MD  Note: This dictation was prepared with Dragon dictation along with smaller phrase technology. Any transcriptional errors that result from this process are unintentional.

## 2015-09-14 LAB — URINALYSIS W MICROSCOPIC + REFLEX CULTURE
Bacteria, UA: NONE SEEN [HPF]
Bilirubin Urine: NEGATIVE
CASTS: NONE SEEN [LPF]
Crystals: NONE SEEN [HPF]
Glucose, UA: NEGATIVE
Hgb urine dipstick: NEGATIVE
KETONES UR: NEGATIVE
NITRITE: NEGATIVE
PH: 7 (ref 5.0–8.0)
Protein, ur: NEGATIVE
SPECIFIC GRAVITY, URINE: 1.011 (ref 1.001–1.035)
Yeast: NONE SEEN [HPF]

## 2015-09-14 LAB — SODIUM: Sodium: 135 mmol/L (ref 135–146)

## 2015-09-15 LAB — URINE CULTURE: Organism ID, Bacteria: 10000

## 2015-09-24 ENCOUNTER — Other Ambulatory Visit: Payer: Self-pay | Admitting: Family Medicine

## 2015-09-24 NOTE — Telephone Encounter (Signed)
Reviewed Cr and K+ from Aug 30th, normal; Rx approved

## 2015-09-28 ENCOUNTER — Ambulatory Visit: Payer: Medicare Other | Admitting: Obstetrics and Gynecology

## 2015-10-01 ENCOUNTER — Ambulatory Visit: Payer: Self-pay | Admitting: Orthopedic Surgery

## 2015-10-14 ENCOUNTER — Ambulatory Visit (INDEPENDENT_AMBULATORY_CARE_PROVIDER_SITE_OTHER): Payer: Medicare Other | Admitting: Obstetrics and Gynecology

## 2015-10-14 ENCOUNTER — Encounter: Payer: Self-pay | Admitting: Obstetrics and Gynecology

## 2015-10-14 VITALS — BP 128/72 | HR 83 | Ht 65.0 in | Wt 151.1 lb

## 2015-10-14 DIAGNOSIS — T8389XD Other specified complication of genitourinary prosthetic devices, implants and grafts, subsequent encounter: Secondary | ICD-10-CM | POA: Diagnosis not present

## 2015-10-14 DIAGNOSIS — Z4689 Encounter for fitting and adjustment of other specified devices: Secondary | ICD-10-CM | POA: Diagnosis not present

## 2015-10-14 DIAGNOSIS — N952 Postmenopausal atrophic vaginitis: Secondary | ICD-10-CM

## 2015-10-14 DIAGNOSIS — N8111 Cystocele, midline: Secondary | ICD-10-CM

## 2015-10-14 DIAGNOSIS — N898 Other specified noninflammatory disorders of vagina: Secondary | ICD-10-CM | POA: Diagnosis not present

## 2015-10-14 NOTE — Patient Instructions (Signed)
1. Pessary is inserted today 2. Return in 3 months for pessary maintenance. If it needs to be extended out to 4 months due to surgery, we will accommodate that

## 2015-10-14 NOTE — Progress Notes (Signed)
Chief complaint: 1. Pessary maintenance 2. Vaginal atrophy 3. Vaginal erosion  Patient presents for pessary insertion. Pessary had been left because of posterior vaginal hyperemia noted at the time of last visit. She is now preparing for the surgery and desires the pessary to be reinserted. She is not reporting any vaginal bleeding or discharge.  Past medical history, medications, and allergies are reviewed  OBJECTIVE: BP 128/72   Pulse 83   Ht 5\' 5"  (1.651 m)   Wt 151 lb 1.6 oz (68.5 kg)   BMI 25.14 kg/m  PELVIC: External Genitalia: Normal BUS: Normal Vagina: Atrophic changes; no epithelial ulceration; mild posterior vagina hyperemia, resolved; cystocele Cervix: Absent Uterus: Absent Adnexa: N/A RV: N/A Bladder: Nontender PROCEDURE: Pessary is reinserted  ASSESSMENT: 1. Cystocele 2. Vaginal hyperemia/erosion, resolved  PLAN: 1. Pessary is inserted today 2. Return in 3 months for follow-up 3. Continue with Trimosan gel intravaginal on a weekly basis  A total of 15 minutes were spent face-to-face with the patient during this encounter and over half of that time dealt with counseling and coordination of care.  Brayton Mars, MD  Note: This dictation was prepared with Dragon dictation along with smaller phrase technology. Any transcriptional errors that result from this process are unintentional.

## 2015-10-15 NOTE — Patient Instructions (Addendum)
Haley Schneider  10/15/2015   Your procedure is scheduled on: 10-25-15  Report to [place]  Entrance take [facility]  elevators to 3rd floor to  [place] at [place] AM.  Call this number if you have problems the morning of surgery [phone]   Remember: ONLY 1 PERSON MAY GO WITH YOU TO SHORT STAY TO GET  READY MORNING OF [place].  Do not eat food or drink liquids :After Midnight.     Take these medicines the morning of surgery with A SIP OF WATER: Synthroid. Tramadol-if need. DO NOT TAKE ANY DIABETIC MEDICATIONS DAY OF YOUR SURGERY                               You may not have any metal on your body including hair pins and              piercings  Do not wear jewelry, make-up, lotions, powders or perfumes, deodorant             Do not wear nail polish.  Do not shave  48 hours prior to surgery.              Men may shave face and neck.   Do not bring valuables to the hospital. [place].  Contacts, dentures or bridgework may not be worn into surgery.  Leave suitcase in the car. After surgery it may be brought to your room.     Patients discharged the day of surgery will not be allowed to drive home.  Name and phone number of your driver: Daughter Judeen Hammans [phone] cell  Special Instructions: N/A              Please read over the following fact sheets you were given: _____________________________________________________________________             [facility] - Preparing for Surgery Before surgery, you can play an important role.  Because skin is not sterile, your skin needs to be as free of germs as possible.  You can reduce the number of germs on your skin by washing with CHG (chlorahexidine gluconate) soap before surgery.  CHG is an antiseptic cleaner which kills germs and bonds with the skin to continue killing germs even after washing. Please DO NOT use if you have an allergy to CHG  or antibacterial soaps.  If your skin becomes reddened/irritated stop using the CHG and inform your nurse when you arrive at Short Stay. Do not shave (including legs and underarms) for at least 48 hours prior to the first CHG shower.  You may shave your face/neck. Please follow these instructions carefully:  1.  Shower with CHG Soap the night before surgery and the  morning of Surgery.  2.  If you choose to wash your hair, wash your hair first as usual with your  normal  shampoo.  3.  After you shampoo, rinse your hair and body thoroughly to remove the  shampoo.                           4.  Use CHG as you would any other liquid soap.  You can apply chg directly  to  the skin and wash                       Gently with a scrungie or clean washcloth.  5.  Apply the CHG Soap to your body ONLY FROM THE NECK DOWN.   Do not use on face/ open                           Wound or open sores. Avoid contact with eyes, ears mouth and genitals (private parts).                       Wash face,  Genitals (private parts) with your normal soap.             6.  Wash thoroughly, paying special attention to the area where your surgery  will be performed.  7.  Thoroughly rinse your body with warm water from the neck down.  8.  DO NOT shower/wash with your normal soap after using and rinsing off  the CHG Soap.                9.  Pat yourself dry with a clean towel.            10.  Wear clean pajamas.            11.  Place clean sheets on your bed the night of your first shower and do not  sleep with pets. Day of Surgery : Do not apply any lotions/deodorants the morning of surgery.  Please wear clean clothes to the hospital/surgery center.  FAILURE TO FOLLOW THESE INSTRUCTIONS MAY RESULT IN THE CANCELLATION OF YOUR SURGERY PATIENT SIGNATURE_________________________________  NURSE SIGNATURE__________________________________  ________________________________________________________________________   Adam Phenix  An incentive spirometer is a tool that can help keep your lungs clear and active. This tool measures how well you are filling your lungs with each breath. Taking long deep breaths may help reverse or decrease the chance of developing breathing (pulmonary) problems (especially infection) following:  A long period of time when you are unable to move or be active. BEFORE THE PROCEDURE   If the spirometer includes an indicator to show your best effort, your nurse or respiratory therapist will set it to a desired goal.  If possible, sit up straight or lean slightly forward. Try not to slouch.  Hold the incentive spirometer in an upright position. INSTRUCTIONS FOR USE  1. Sit on the edge of your bed if possible, or sit up as far as you can in bed or on a chair. 2. Hold the incentive spirometer in an upright position. 3. Breathe out normally. 4. Place the mouthpiece in your mouth and seal your lips tightly around it. 5. Breathe in slowly and as deeply as possible, raising the piston or the ball toward the top of the column. 6. Hold your breath for 3-5 seconds or for as long as possible. Allow the piston or ball to fall to the bottom of the column. 7. Remove the mouthpiece from your mouth and breathe out normally. 8. Rest for a few seconds and repeat Steps 1 through 7 at least 10 times every 1-2 hours when you are awake. Take your time and take a few normal breaths between deep breaths. 9. The spirometer may include an indicator to show your best effort. Use the indicator as a goal to work toward during each repetition. 10. After each set  of 10 deep breaths, practice coughing to be sure your lungs are clear. If you have an incision (the cut made at the time of surgery), support your incision when coughing by placing a pillow or rolled up towels firmly against it. Once you are able to get out of bed, walk around indoors and cough well. You may stop using the incentive spirometer when  instructed by your caregiver.  RISKS AND COMPLICATIONS  Take your time so you do not get dizzy or light-headed.  If you are in pain, you may need to take or ask for pain medication before doing incentive spirometry. It is harder to take a deep breath if you are having pain. AFTER USE  Rest and breathe slowly and easily.  It can be helpful to keep track of a log of your progress. Your caregiver can provide you with a simple table to help with this. If you are using the spirometer at home, follow these instructions: [place] IF:   You are having difficultly using the spirometer.  You have trouble using the spirometer as often as instructed.  Your pain medication is not giving enough relief while using the spirometer.  You develop fever of 100.5 F (38.1 C) or higher. SEEK IMMEDIATE MEDICAL CARE IF:   You cough up bloody sputum that had not been present before.  You develop fever of 102 F (38.9 C) or greater.  You develop worsening pain at or near the incision site. MAKE SURE YOU:   Understand these instructions.  Will watch your condition.  Will get help right away if you are not doing well or get worse. Document Released: 05/01/2006 Document Revised: 03/13/2011 Document Reviewed: 07/02/2006 Exit[employer] Patient Information 2014 [employer], [place].   ________________________________________________________________________  WHAT IS A BLOOD TRANSFUSION? Blood Transfusion Information  A transfusion is the replacement of blood or some of its parts. Blood is made up of multiple cells which provide different functions.  Red blood cells carry oxygen and are used for blood loss replacement.  White blood cells fight against infection.  Platelets control bleeding.  Plasma helps clot blood.  Other blood products are available for specialized needs, such as hemophilia or other clotting disorders. BEFORE THE TRANSFUSION  Who gives blood for transfusions?   Healthy  volunteers who are fully evaluated to make sure their blood is safe. This is blood bank blood. Transfusion therapy is the safest it has ever been in the practice of medicine. Before blood is taken from a donor, a complete history is taken to make sure that person has no history of diseases nor engages in risky social behavior (examples are intravenous drug use or sexual activity with multiple partners). The donor's travel history is screened to minimize risk of transmitting infections, such as malaria. The donated blood is tested for signs of infectious diseases, such as HIV and hepatitis. The blood is then tested to be sure it is compatible with you in order to minimize the chance of a transfusion reaction. If you or a relative donates blood, this is often done in anticipation of surgery and is not appropriate for emergency situations. It takes many days to process the donated blood. RISKS AND COMPLICATIONS Although transfusion therapy is very safe and saves many lives, the main dangers of transfusion include:   Getting an infectious disease.  Developing a transfusion reaction. This is an allergic reaction to something in the blood you were given. Every precaution is taken to prevent this. The decision to have a  blood transfusion has been considered carefully by your caregiver before blood is given. Blood is not given unless the benefits outweigh the risks. AFTER THE TRANSFUSION  Right after receiving a blood transfusion, you will usually feel much better and more energetic. This is especially true if your red blood cells have gotten low (anemic). The transfusion raises the level of the red blood cells which carry oxygen, and this usually causes an energy increase.  The nurse administering the transfusion will monitor you carefully for complications. HOME CARE INSTRUCTIONS  No special instructions are needed after a transfusion. You may find your energy is better. Speak with your caregiver about any  limitations on activity for underlying diseases you may have. SEEK MEDICAL CARE IF:   Your condition is not improving after your transfusion.  You develop redness or irritation at the intravenous (IV) site. SEEK IMMEDIATE MEDICAL CARE IF:  Any of the following symptoms occur over the next 12 hours:  Shaking chills.  You have a temperature by mouth above 102 F (38.9 C), not controlled by medicine.  Chest, back, or muscle pain.  People around you feel you are not acting correctly or are confused.  Shortness of breath or difficulty breathing.  Dizziness and fainting.  You get a rash or develop hives.  You have a decrease in urine output.  Your urine turns a dark color or changes to pink, red, or brown. Any of the following symptoms occur over the next 10 days:  You have a temperature by mouth above 102 F (38.9 C), not controlled by medicine.  Shortness of breath.  Weakness after normal activity.  The white part of the eye turns yellow (jaundice).  You have a decrease in the amount of urine or are urinating less often.  Your urine turns a dark color or changes to pink, red, or brown. Document Released: 12/17/1999 Document Revised: 03/13/2011 Document Reviewed: 08/05/2007 [facility] Patient Information 2014 [place], [place].  _______________________________________________________________________

## 2015-10-18 ENCOUNTER — Encounter (HOSPITAL_COMMUNITY)
Admission: RE | Admit: 2015-10-18 | Discharge: 2015-10-18 | Disposition: A | Discharge status: Home / Self Care | Payer: Medicare Other | Source: Ambulatory Visit | Attending: Orthopedic Surgery | Admitting: Orthopedic Surgery

## 2015-10-18 ENCOUNTER — Other Ambulatory Visit: Payer: Self-pay

## 2015-10-18 ENCOUNTER — Encounter (HOSPITAL_COMMUNITY): Payer: Self-pay

## 2015-10-18 ENCOUNTER — Encounter (INDEPENDENT_AMBULATORY_CARE_PROVIDER_SITE_OTHER): Payer: Self-pay

## 2015-10-18 DIAGNOSIS — I10 Essential (primary) hypertension: Secondary | ICD-10-CM | POA: Diagnosis not present

## 2015-10-18 DIAGNOSIS — M1712 Unilateral primary osteoarthritis, left knee: Secondary | ICD-10-CM | POA: Diagnosis not present

## 2015-10-18 DIAGNOSIS — Z01810 Encounter for preprocedural cardiovascular examination: Secondary | ICD-10-CM | POA: Diagnosis present

## 2015-10-18 DIAGNOSIS — Z01812 Encounter for preprocedural laboratory examination: Secondary | ICD-10-CM | POA: Diagnosis present

## 2015-10-18 LAB — URINALYSIS, ROUTINE W REFLEX MICROSCOPIC
BILIRUBIN URINE: NEGATIVE
Glucose, UA: NEGATIVE mg/dL
KETONES UR: NEGATIVE mg/dL
NITRITE: NEGATIVE
PH: 6.5 (ref 5.0–8.0)
PROTEIN: NEGATIVE mg/dL
Specific Gravity, Urine: 1.006 (ref 1.005–1.030)

## 2015-10-18 LAB — COMPREHENSIVE METABOLIC PANEL
ALBUMIN: 4.1 g/dL (ref 3.5–5.0)
ALT: 12 U/L — AB (ref 14–54)
AST: 21 U/L (ref 15–41)
Alkaline Phosphatase: 77 U/L (ref 38–126)
Anion gap: 6 (ref 5–15)
BUN: 10 mg/dL (ref 6–20)
CHLORIDE: 103 mmol/L (ref 101–111)
CO2: 28 mmol/L (ref 22–32)
CREATININE: 0.63 mg/dL (ref 0.44–1.00)
Calcium: 10.5 mg/dL — ABNORMAL HIGH (ref 8.9–10.3)
GFR calc Af Amer: >60 mL/min (ref >60)
GLUCOSE: 91 mg/dL (ref 65–99)
POTASSIUM: 5.1 mmol/L (ref 3.5–5.1)
SODIUM: 137 mmol/L (ref 135–145)
Total Bilirubin: 0.7 mg/dL (ref 0.3–1.2)
Total Protein: 6.9 g/dL (ref 6.5–8.1)

## 2015-10-18 LAB — CBC
HCT: 41.6 % (ref 36.0–46.0)
Hemoglobin: 13.6 g/dL (ref 12.0–15.0)
MCH: 28.3 pg (ref 26.0–34.0)
MCHC: 32.7 g/dL (ref 30.0–36.0)
MCV: 86.7 fL (ref 78.0–100.0)
PLATELETS: 325 10*3/uL (ref 150–400)
RBC: 4.8 MIL/uL (ref 3.87–5.11)
RDW: 14.2 % (ref 11.5–15.5)
WBC: 9 10*3/uL (ref 4.0–10.5)

## 2015-10-18 LAB — APTT: APTT: 30 s (ref 24–36)

## 2015-10-18 LAB — PROTIME-INR
INR: 0.99
PROTHROMBIN TIME: 13.1 s (ref 11.4–15.2)

## 2015-10-18 LAB — URINE MICROSCOPIC-ADD ON

## 2015-10-18 LAB — SURGICAL PCR SCREEN
MRSA, PCR: NEGATIVE
STAPHYLOCOCCUS AUREUS: NEGATIVE

## 2015-10-18 NOTE — Progress Notes (Signed)
10-18-15 1635  Note labs viewable in Epic-urinalysis, note to office [phone].

## 2015-10-18 NOTE — Pre-Procedure Instructions (Signed)
EKG done today.

## 2015-10-24 ENCOUNTER — Ambulatory Visit: Payer: Self-pay | Admitting: Orthopedic Surgery

## 2015-10-24 NOTE — H&P (Signed)
Haley Schneider DOB: 07-Sep-1935 Widowed / Language: Cleophus Schneider / Race: White Female Date of Admission:  10/25/2015 CC:  Left Knee Pain History of Present Illness  The patient is a 80 year old female who comes in for a preoperative History and Physical. The patient is scheduled for a left total knee arthroplasty to be performed by Dr. Dione Plover. Aluisio, MD at [facility] on 10-25-2015. The patient is a 80 year old female who presented for follow up of their knee. The patient is being followed for their left knee pain and osteoarthritis. They are now months out from Euflexxa series. Symptoms reported include: pain, aching, stiffness and grinding. The patient feels that they are doing poorly and report their pain level to be mild to moderate. The following medication has been used for pain control: antiinflammatory medication and Tramadol. The patient has indicated that they want to progress with surgery at this point. The viscosupplements were four months ago. She did not receive a great amount of benefit. The knee continues to get worse. She is getting progressive valgus in the knee. It is limiting her activities. She is ready to proceed with the procedure. They have been treated conservatively in the past for the above stated problem and despite conservative measures, they continue to have progressive pain and severe functional limitations and dysfunction. They have failed non-operative management including home exercise, medications, and injections. It is felt that they would benefit from undergoing total joint replacement. Risks and benefits of the procedure have been discussed with the patient and they elect to proceed with surgery. There are no active contraindications to surgery such as ongoing infection or rapidly progressive neurological disease.  Problem List/Past Medical Posterior tibial tendinitis of right lower extremity [id number])  Primary osteoarthritis of right hip (M16.11)   Primary osteoarthritis of right knee (M17.11)  Effusion of left knee (M25.462)  Chronic pain of left knee (M25.562)  Status post right hip replacement [id number])  Hypothyroidism  Osteoarthritis  Osteoporosis  High blood pressure  Impaired Vision  Cataract  Varicose veins  Urinary Incontinence  Urinary Tract Infection  Eczema  Allergies No Known Drug Allergies   Family History  Cancer  Father. Hypertension  Father. Rheumatoid Arthritis  Father.  Social History  Current work status  retired Furniture conservator/restorer weekly; does other Living situation  live alone Children  2 Tobacco use  Never smoker. 03/18/2014 Not under pain contract  Marital status  widowed Never consumed alcohol  03/18/2014: Never consumed alcohol No history of drug/alcohol rehab  Advance Directives  Living Will, Healthcare POA  Medication History Tramadol Active. Aspirin (81MG  Tablet, Oral occasionally) Active. Raloxifene HCl (60MG  Tablet, Oral) Active. Losartan Potassium (Oral) Specific strength unknown - Active. Synthroid (50MCG Tablet, Oral) Active. AmLODIPine Besylate (5MG  Tablet, Oral) Active. Vitamin B-12 (1000MCG Tablet, 5000 Oral daily) Active. Super B Complaex Active. Magnesium (Oral) Specific strength unknown - Active. (with zinc) Biotin (5000MCG Tablet, 10,000 Oral) Active. Vitamin C ER (1000MG  Tablet ER, Oral) Active. Vitamin A & D (Oral) Active. Vitamin D-3 (1000UNIT Capsule, Oral) Active. Coral Calcium Active. Turmeric Active. Omega-3 Krill Oil (3x Oral weekly) Specific strength unknown - Active. Advil (200MG  Tablet, Oral) Active. Lutein (20MG  Tablet, Oral) Active.  Past Surgical History Thyroidectomy; Subtotal  Date: 1965. Tonsillectomy  Total Hip Replacement  left Cataract Surgery  bilateral Inguinal Hernia Repair  open: left Thyroglosal Gland Removal  Total Hip Replacement - Right    Review of Systems General Present-  Fatigue. Not Present- Chills, Fever,  Memory Loss, Night Sweats, Weight Gain and Weight Loss. Skin Not Present- Eczema, Hives, Itching, Lesions and Rash. HEENT Not Present- Dentures, Double Vision, Headache, Hearing Loss, Tinnitus and Visual Loss. Respiratory Not Present- Allergies, Chronic Cough, Coughing up blood, Shortness of breath at rest and Shortness of breath with exertion. Cardiovascular Not Present- Chest Pain, Difficulty Breathing Lying Down, Murmur, Palpitations, Racing/skipping heartbeats and Swelling. Gastrointestinal Not Present- Abdominal Pain, Bloody Stool, Constipation, Diarrhea, Difficulty Swallowing, Heartburn, Jaundice, Loss of appetitie, Nausea and Vomiting. Female Genitourinary Present- Incontinence, Urinary frequency and Urinating at Night. Not Present- Blood in Urine, Discharge, Flank Pain, Painful Urination, Urgency, Urinary Retention and Weak urinary stream. Musculoskeletal Present- Joint Pain and Muscle Weakness. Not Present- Back Pain, Joint Swelling, Morning Stiffness, Muscle Pain and Spasms. Neurological Not Present- Blackout spells, Difficulty with balance, Dizziness, Paralysis, Tremor and Weakness. Psychiatric Not Present- Insomnia.  Vitals  10/05/2015 3:42 PM Weight: 150 lb Height: 65.5in Body Surface Area: 1.76 m Body Mass Index: 24.58 kg/m  Pulse: 84 (Regular)  BP: 128/72 (Sitting, Right Arm, Standard)   Physical Exam  General Mental Status -Alert, cooperative and good historian. General Appearance-pleasant, Not in acute distress. Orientation-Oriented X3. Build & Nutrition-Well nourished and Well developed.  Head and Neck Head-normocephalic, atraumatic . Neck Global Assessment - supple, no bruit auscultated on the right, no bruit auscultated on the left.  Eye Pupil - Bilateral-Regular and Round. Motion - Bilateral-EOMI.  Chest and Lung Exam Auscultation Breath sounds - clear at anterior chest wall and clear at posterior  chest wall. Adventitious sounds - No Adventitious sounds.  Cardiovascular Auscultation Rhythm - Regular rate and rhythm. Heart Sounds - S1 WNL and S2 WNL. Murmurs & Other Heart Sounds - Auscultation of the heart reveals - No Murmurs.  Abdomen Palpation/Percussion Tenderness - Abdomen is non-tender to palpation. Rigidity (guarding) - Abdomen is soft. Auscultation Auscultation of the abdomen reveals - Bowel sounds normal.  Female Genitourinary Note: Not done, not pertinent to present illness   Musculoskeletal Note: On exam, she is in no distress. Her right hip can be flexed to 120, rotated in 30, out 40, abducted 40 without discomfort. She is slightly tender over the right greater trochanter and that reproduces most of her pain. Her left knee which we are replacing next month has a significant valgus deformity and when she is walking, she is putting a lot of extra stress on that right hip.  RADIOGRAPHS She did have radiographs, AP pelvis and lateral of the right hip. AP of the right hip showing a prosthesis in excellent position with no periprosthetic abnormalities.   Assessment & Plan Primary osteoarthritis of left knee (M17.12) Status post right hip replacement [id number] Status post left hip replacement [id number])  Note:Surgical Plans: Left Total Knee Replacement  Disposition: Home  PCP: Dr. Sanda Klein  IV TXA  Anesthesia Issues: None  Signed electronically by Ok Edwards, III PA-C

## 2015-10-25 ENCOUNTER — Encounter (HOSPITAL_COMMUNITY)
Admission: RE | Disposition: A | Discharge status: Home Health | Payer: Self-pay | Source: Ambulatory Visit | Attending: Orthopedic Surgery

## 2015-10-25 ENCOUNTER — Encounter (HOSPITAL_COMMUNITY): Payer: Self-pay | Admitting: *Deleted

## 2015-10-25 ENCOUNTER — Inpatient Hospital Stay (HOSPITAL_COMMUNITY): Payer: Medicare Other | Admitting: Anesthesiology

## 2015-10-25 ENCOUNTER — Inpatient Hospital Stay (HOSPITAL_COMMUNITY)
Admission: RE | Admit: 2015-10-25 | Discharge: 2015-10-27 | DRG: 470 | Disposition: A | Discharge status: Home Health | Payer: Medicare Other | Source: Ambulatory Visit | Attending: Orthopedic Surgery | Admitting: Orthopedic Surgery

## 2015-10-25 DIAGNOSIS — M810 Age-related osteoporosis without current pathological fracture: Secondary | ICD-10-CM | POA: Diagnosis present

## 2015-10-25 DIAGNOSIS — Z96643 Presence of artificial hip joint, bilateral: Secondary | ICD-10-CM | POA: Diagnosis present

## 2015-10-25 DIAGNOSIS — Z7982 Long term (current) use of aspirin: Secondary | ICD-10-CM | POA: Diagnosis not present

## 2015-10-25 DIAGNOSIS — I10 Essential (primary) hypertension: Secondary | ICD-10-CM | POA: Diagnosis present

## 2015-10-25 DIAGNOSIS — M1710 Unilateral primary osteoarthritis, unspecified knee: Secondary | ICD-10-CM | POA: Diagnosis present

## 2015-10-25 DIAGNOSIS — Z79899 Other long term (current) drug therapy: Secondary | ICD-10-CM | POA: Diagnosis not present

## 2015-10-25 DIAGNOSIS — M1712 Unilateral primary osteoarthritis, left knee: Secondary | ICD-10-CM

## 2015-10-25 DIAGNOSIS — E039 Hypothyroidism, unspecified: Secondary | ICD-10-CM | POA: Diagnosis present

## 2015-10-25 DIAGNOSIS — M179 Osteoarthritis of knee, unspecified: Secondary | ICD-10-CM | POA: Diagnosis present

## 2015-10-25 DIAGNOSIS — Z79891 Long term (current) use of opiate analgesic: Secondary | ICD-10-CM

## 2015-10-25 HISTORY — PX: TOTAL KNEE ARTHROPLASTY: SHX125

## 2015-10-25 LAB — TYPE AND SCREEN
ABO/RH(D): O NEG
ANTIBODY SCREEN: NEGATIVE

## 2015-10-25 SURGERY — ARTHROPLASTY, KNEE, TOTAL
Anesthesia: Spinal | Site: Knee | Laterality: Left

## 2015-10-25 MED ORDER — METOCLOPRAMIDE HCL 5 MG PO TABS: 5.0000 mg | ORAL_TABLET | Freq: Three times a day (TID) | ORAL | Status: DC | PRN

## 2015-10-25 MED ORDER — BUPIVACAINE LIPOSOME 1.3 % IJ SUSP
INTRAMUSCULAR | Status: DC | PRN
  Administered 2015-10-25: 20 mL

## 2015-10-25 MED ORDER — METHOCARBAMOL 500 MG PO TABS
500.0000 mg | ORAL_TABLET | Freq: Four times a day (QID) | ORAL | Status: DC | PRN
  Administered 2015-10-25 – 2015-10-27 (×4): 500 mg via ORAL
  Filled 2015-10-25 (×5): qty 1

## 2015-10-25 MED ORDER — PHENOL 1.4 % MT LIQD
1.0000 | OROMUCOSAL | Status: DC | PRN
  Filled 2015-10-25: qty 177

## 2015-10-25 MED ORDER — CEFAZOLIN SODIUM-DEXTROSE 2-4 GM/100ML-% IV SOLN
2.0000 g | INTRAVENOUS | Status: AC
  Administered 2015-10-25: 2 g via INTRAVENOUS
  Filled 2015-10-25: qty 100

## 2015-10-25 MED ORDER — ONDANSETRON HCL 4 MG/2ML IJ SOLN: 4.0000 mg | Freq: Four times a day (QID) | INTRAMUSCULAR | Status: DC | PRN

## 2015-10-25 MED ORDER — FLEET ENEMA 7-19 GM/118ML RE ENEM: 1.0000 | ENEMA | Freq: Once | RECTAL | Status: DC | PRN

## 2015-10-25 MED ORDER — DEXAMETHASONE SODIUM PHOSPHATE 10 MG/ML IJ SOLN
10.0000 mg | Freq: Once | INTRAMUSCULAR | Status: AC
  Administered 2015-10-26: 10 mg via INTRAVENOUS
  Filled 2015-10-25: qty 1

## 2015-10-25 MED ORDER — LACTATED RINGERS IV SOLN
INTRAVENOUS | Status: DC
  Administered 2015-10-25 (×3): via INTRAVENOUS

## 2015-10-25 MED ORDER — OXYCODONE HCL 5 MG PO TABS
5.0000 mg | ORAL_TABLET | ORAL | Status: DC | PRN
  Administered 2015-10-25 (×2): 10 mg via ORAL
  Administered 2015-10-25 – 2015-10-26 (×3): 5 mg via ORAL
  Administered 2015-10-26: 10 mg via ORAL
  Administered 2015-10-26: 5 mg via ORAL
  Administered 2015-10-26 – 2015-10-27 (×5): 10 mg via ORAL
  Filled 2015-10-25: qty 2
  Filled 2015-10-25 (×2): qty 1
  Filled 2015-10-25 (×7): qty 2
  Filled 2015-10-25 (×2): qty 1

## 2015-10-25 MED ORDER — DEXAMETHASONE SODIUM PHOSPHATE 10 MG/ML IJ SOLN
INTRAMUSCULAR | Status: AC
  Filled 2015-10-25: qty 1

## 2015-10-25 MED ORDER — PHENYLEPHRINE 40 MCG/ML (10ML) SYRINGE FOR IV PUSH (FOR BLOOD PRESSURE SUPPORT)
PREFILLED_SYRINGE | INTRAVENOUS | Status: AC
  Filled 2015-10-25: qty 10

## 2015-10-25 MED ORDER — BUPIVACAINE HCL (PF) 0.25 % IJ SOLN
INTRAMUSCULAR | Status: AC
  Filled 2015-10-25: qty 30

## 2015-10-25 MED ORDER — ACETAMINOPHEN 500 MG PO TABS
1000.0000 mg | ORAL_TABLET | Freq: Four times a day (QID) | ORAL | Status: AC
  Administered 2015-10-25 – 2015-10-26 (×4): 1000 mg via ORAL
  Filled 2015-10-25 (×4): qty 2

## 2015-10-25 MED ORDER — STERILE WATER FOR IRRIGATION IR SOLN
Status: DC | PRN
  Administered 2015-10-25: 2000 mL

## 2015-10-25 MED ORDER — METOCLOPRAMIDE HCL 5 MG/ML IJ SOLN: 5.0000 mg | Freq: Three times a day (TID) | INTRAMUSCULAR | Status: DC | PRN

## 2015-10-25 MED ORDER — ACETAMINOPHEN 325 MG PO TABS: 650.0000 mg | ORAL_TABLET | Freq: Four times a day (QID) | ORAL | Status: DC | PRN

## 2015-10-25 MED ORDER — MIDAZOLAM HCL 5 MG/5ML IJ SOLN
INTRAMUSCULAR | Status: DC | PRN
  Administered 2015-10-25: 2 mg via INTRAVENOUS

## 2015-10-25 MED ORDER — ONDANSETRON HCL 4 MG PO TABS
4.0000 mg | ORAL_TABLET | Freq: Four times a day (QID) | ORAL | Status: DC | PRN
  Administered 2015-10-27: 4 mg via ORAL
  Filled 2015-10-25: qty 1

## 2015-10-25 MED ORDER — ONDANSETRON HCL 4 MG/2ML IJ SOLN
INTRAMUSCULAR | Status: DC | PRN
  Administered 2015-10-25: 4 mg via INTRAVENOUS

## 2015-10-25 MED ORDER — SODIUM CHLORIDE 0.9 % IV SOLN
INTRAVENOUS | Status: DC
  Administered 2015-10-25: 14:00:00 via INTRAVENOUS

## 2015-10-25 MED ORDER — BUPIVACAINE LIPOSOME 1.3 % IJ SUSP
20.0000 mL | Freq: Once | INTRAMUSCULAR | Status: DC
  Filled 2015-10-25: qty 20

## 2015-10-25 MED ORDER — SODIUM CHLORIDE 0.9 % IR SOLN
Status: DC | PRN
Start: 2015-10-25 — End: 2015-10-25
  Administered 2015-10-25: 1000 mL

## 2015-10-25 MED ORDER — SODIUM CHLORIDE 0.9 % IJ SOLN
INTRAMUSCULAR | Status: DC | PRN
  Administered 2015-10-25: 30 mL

## 2015-10-25 MED ORDER — CHLORHEXIDINE GLUCONATE 4 % EX LIQD: 60.0000 mL | Freq: Once | CUTANEOUS | Status: DC

## 2015-10-25 MED ORDER — 0.9 % SODIUM CHLORIDE (POUR BTL) OPTIME
TOPICAL | Status: DC | PRN
  Administered 2015-10-25: 1000 mL

## 2015-10-25 MED ORDER — ACETAMINOPHEN 10 MG/ML IV SOLN
INTRAVENOUS | Status: AC
  Filled 2015-10-25: qty 100

## 2015-10-25 MED ORDER — ACETAMINOPHEN 10 MG/ML IV SOLN
1000.0000 mg | Freq: Once | INTRAVENOUS | Status: AC
  Administered 2015-10-25: 1000 mg via INTRAVENOUS
  Filled 2015-10-25: qty 100

## 2015-10-25 MED ORDER — RIVAROXABAN 10 MG PO TABS
10.0000 mg | ORAL_TABLET | Freq: Every day | ORAL | Status: DC
  Administered 2015-10-26 – 2015-10-27 (×2): 10 mg via ORAL
  Filled 2015-10-25 (×2): qty 1

## 2015-10-25 MED ORDER — SODIUM CHLORIDE 0.9 % IJ SOLN
INTRAMUSCULAR | Status: AC
  Filled 2015-10-25: qty 50

## 2015-10-25 MED ORDER — FENTANYL CITRATE (PF) 100 MCG/2ML IJ SOLN
INTRAMUSCULAR | Status: AC
  Filled 2015-10-25: qty 2

## 2015-10-25 MED ORDER — CEFAZOLIN SODIUM-DEXTROSE 2-4 GM/100ML-% IV SOLN
INTRAVENOUS | Status: AC
  Filled 2015-10-25: qty 100

## 2015-10-25 MED ORDER — LEVOTHYROXINE SODIUM 50 MCG PO TABS
50.0000 ug | ORAL_TABLET | Freq: Every day | ORAL | Status: DC
  Administered 2015-10-26 – 2015-10-27 (×2): 50 ug via ORAL
  Filled 2015-10-25 (×2): qty 1

## 2015-10-25 MED ORDER — TRAMADOL HCL 50 MG PO TABS
50.0000 mg | ORAL_TABLET | Freq: Four times a day (QID) | ORAL | Status: DC | PRN
  Administered 2015-10-26: 100 mg via ORAL
  Filled 2015-10-25: qty 2

## 2015-10-25 MED ORDER — DIPHENHYDRAMINE HCL 12.5 MG/5ML PO ELIX: 12.5000 mg | ORAL_SOLUTION | ORAL | Status: DC | PRN

## 2015-10-25 MED ORDER — MENTHOL 3 MG MT LOZG
1.0000 | LOZENGE | OROMUCOSAL | Status: DC | PRN
  Administered 2015-10-26: 3 mg via ORAL
  Filled 2015-10-25: qty 9

## 2015-10-25 MED ORDER — BUPIVACAINE HCL 0.25 % IJ SOLN
INTRAMUSCULAR | Status: DC | PRN
  Administered 2015-10-25: 20 mL

## 2015-10-25 MED ORDER — DEXAMETHASONE SODIUM PHOSPHATE 10 MG/ML IJ SOLN
10.0000 mg | Freq: Once | INTRAMUSCULAR | Status: AC
  Administered 2015-10-25: 10 mg via INTRAVENOUS

## 2015-10-25 MED ORDER — TRANEXAMIC ACID 1000 MG/10ML IV SOLN
1000.0000 mg | INTRAVENOUS | Status: AC
  Administered 2015-10-25: 1000 mg via INTRAVENOUS
  Filled 2015-10-25: qty 1100

## 2015-10-25 MED ORDER — METHOCARBAMOL 1000 MG/10ML IJ SOLN
500.0000 mg | Freq: Four times a day (QID) | INTRAVENOUS | Status: DC | PRN
  Administered 2015-10-26: 500 mg via INTRAVENOUS
  Filled 2015-10-25: qty 5
  Filled 2015-10-25: qty 550

## 2015-10-25 MED ORDER — TRANEXAMIC ACID 1000 MG/10ML IV SOLN
1000.0000 mg | Freq: Once | INTRAVENOUS | Status: AC
  Administered 2015-10-25: 1000 mg via INTRAVENOUS
  Filled 2015-10-25: qty 1100

## 2015-10-25 MED ORDER — ACETAMINOPHEN 650 MG RE SUPP: 650.0000 mg | Freq: Four times a day (QID) | RECTAL | Status: DC | PRN

## 2015-10-25 MED ORDER — BISACODYL 10 MG RE SUPP: 10.0000 mg | Freq: Every day | RECTAL | Status: DC | PRN

## 2015-10-25 MED ORDER — POLYETHYLENE GLYCOL 3350 17 G PO PACK
17.0000 g | PACK | Freq: Every day | ORAL | Status: DC | PRN
  Administered 2015-10-26: 17 g via ORAL
  Filled 2015-10-25: qty 1

## 2015-10-25 MED ORDER — DOCUSATE SODIUM 100 MG PO CAPS
100.0000 mg | ORAL_CAPSULE | Freq: Two times a day (BID) | ORAL | Status: DC
  Administered 2015-10-25 – 2015-10-27 (×5): 100 mg via ORAL
  Filled 2015-10-25 (×5): qty 1

## 2015-10-25 MED ORDER — PROPOFOL 10 MG/ML IV BOLUS
INTRAVENOUS | Status: DC | PRN
  Administered 2015-10-25: 30 mg via INTRAVENOUS

## 2015-10-25 MED ORDER — LOSARTAN POTASSIUM 50 MG PO TABS
50.0000 mg | ORAL_TABLET | Freq: Every day | ORAL | Status: DC
  Administered 2015-10-27: 50 mg via ORAL
  Filled 2015-10-25 (×2): qty 1

## 2015-10-25 MED ORDER — PHENYLEPHRINE HCL 10 MG/ML IJ SOLN
INTRAMUSCULAR | Status: DC | PRN
  Administered 2015-10-25 (×5): 40 ug via INTRAVENOUS
  Administered 2015-10-25 (×2): 80 ug via INTRAVENOUS

## 2015-10-25 MED ORDER — PROPOFOL 500 MG/50ML IV EMUL
INTRAVENOUS | Status: DC | PRN
  Administered 2015-10-25: 75 ug/kg/min via INTRAVENOUS

## 2015-10-25 MED ORDER — PROPOFOL 10 MG/ML IV BOLUS
INTRAVENOUS | Status: AC
  Filled 2015-10-25: qty 40

## 2015-10-25 MED ORDER — MIDAZOLAM HCL 2 MG/2ML IJ SOLN
INTRAMUSCULAR | Status: AC
  Filled 2015-10-25: qty 2

## 2015-10-25 MED ORDER — CEFAZOLIN SODIUM-DEXTROSE 2-4 GM/100ML-% IV SOLN
2.0000 g | Freq: Four times a day (QID) | INTRAVENOUS | Status: AC
  Administered 2015-10-25 (×2): 2 g via INTRAVENOUS
  Filled 2015-10-25 (×2): qty 100

## 2015-10-25 MED ORDER — FENTANYL CITRATE (PF) 100 MCG/2ML IJ SOLN
INTRAMUSCULAR | Status: DC | PRN
  Administered 2015-10-25 (×2): 50 ug via INTRAVENOUS

## 2015-10-25 MED ORDER — BUPIVACAINE IN DEXTROSE 0.75-8.25 % IT SOLN
INTRATHECAL | Status: DC | PRN
  Administered 2015-10-25: 1.8 mL via INTRATHECAL

## 2015-10-25 MED ORDER — MORPHINE SULFATE (PF) 2 MG/ML IV SOLN: 1.0000 mg | INTRAVENOUS | Status: DC | PRN

## 2015-10-25 MED ORDER — ONDANSETRON HCL 4 MG/2ML IJ SOLN
INTRAMUSCULAR | Status: AC
  Filled 2015-10-25: qty 2

## 2015-10-25 SURGICAL SUPPLY — 46 items
BAG ZIPLOCK 12X15 (MISCELLANEOUS) ×3 IMPLANT
BANDAGE ACE 6X5 VEL STRL LF (GAUZE/BANDAGES/DRESSINGS) ×3 IMPLANT
BLADE SAG 18X100X1.27 (BLADE) ×3 IMPLANT
BLADE SAW SGTL 11.0X1.19X90.0M (BLADE) ×3 IMPLANT
BOWL SMART MIX CTS (DISPOSABLE) ×3 IMPLANT
CAP KNEE TOTAL 3 SIGMA ×3 IMPLANT
CEMENT HV SMART SET (Cement) ×6 IMPLANT
CLOSURE WOUND 1/2 X4 (GAUZE/BANDAGES/DRESSINGS) ×1
CLOTH BEACON ORANGE TIMEOUT ST (SAFETY) ×3 IMPLANT
CUFF TOURN SGL QUICK 34 (TOURNIQUET CUFF) ×2
CUFF TRNQT CYL 34X4X40X1 (TOURNIQUET CUFF) ×1 IMPLANT
DECANTER SPIKE VIAL GLASS SM (MISCELLANEOUS) ×3 IMPLANT
DRAPE U-SHAPE 47X51 STRL (DRAPES) ×3 IMPLANT
DRSG ADAPTIC 3X8 NADH LF (GAUZE/BANDAGES/DRESSINGS) ×3 IMPLANT
DURAPREP 26ML APPLICATOR (WOUND CARE) ×3 IMPLANT
ELECT REM PT RETURN 9FT ADLT (ELECTROSURGICAL) ×3
ELECTRODE REM PT RTRN 9FT ADLT (ELECTROSURGICAL) ×1 IMPLANT
EVACUATOR 1/8 PVC DRAIN (DRAIN) ×3 IMPLANT
GAUZE SPONGE 4X4 12PLY STRL (GAUZE/BANDAGES/DRESSINGS) ×3 IMPLANT
GLOVE BIO SURGEON STRL SZ8 (GLOVE) ×3 IMPLANT
GLOVE BIOGEL PI IND STRL 6.5 (GLOVE) ×3 IMPLANT
GLOVE BIOGEL PI IND STRL 7.5 (GLOVE) ×2 IMPLANT
GLOVE BIOGEL PI IND STRL 8 (GLOVE) ×1 IMPLANT
GLOVE BIOGEL PI INDICATOR 6.5 (GLOVE) ×6
GLOVE BIOGEL PI INDICATOR 7.5 (GLOVE) ×4
GLOVE BIOGEL PI INDICATOR 8 (GLOVE) ×2
GLOVE SURG SS PI 6.5 STRL IVOR (GLOVE) ×9 IMPLANT
GOWN STRL REUS W/TWL LRG LVL3 (GOWN DISPOSABLE) ×12 IMPLANT
HANDPIECE INTERPULSE COAX TIP (DISPOSABLE) ×2
IMMOBILIZER KNEE 20 (SOFTGOODS) ×3
IMMOBILIZER KNEE 20 THIGH 36 (SOFTGOODS) ×1 IMPLANT
MANIFOLD NEPTUNE II (INSTRUMENTS) ×3 IMPLANT
PACK TOTAL KNEE CUSTOM (KITS) ×3 IMPLANT
PAD ABD 8X10 STRL (GAUZE/BANDAGES/DRESSINGS) ×3 IMPLANT
PADDING CAST COTTON 6X4 STRL (CAST SUPPLIES) ×9 IMPLANT
POSITIONER SURGICAL ARM (MISCELLANEOUS) ×3 IMPLANT
SET HNDPC FAN SPRY TIP SCT (DISPOSABLE) ×1 IMPLANT
STRIP CLOSURE SKIN 1/2X4 (GAUZE/BANDAGES/DRESSINGS) ×2 IMPLANT
SUT MNCRL AB 4-0 PS2 18 (SUTURE) ×3 IMPLANT
SUT VIC AB 2-0 CT1 27 (SUTURE) ×6
SUT VIC AB 2-0 CT1 TAPERPNT 27 (SUTURE) ×3 IMPLANT
SUT VLOC 180 0 24IN GS25 (SUTURE) ×3 IMPLANT
SYR 50ML LL SCALE MARK (SYRINGE) ×3 IMPLANT
TRAY FOLEY CATH SILVER 14FR (SET/KITS/TRAYS/PACK) ×3 IMPLANT
WRAP KNEE MAXI GEL POST OP (GAUZE/BANDAGES/DRESSINGS) ×3 IMPLANT
YANKAUER SUCT BULB TIP 10FT TU (MISCELLANEOUS) ×3 IMPLANT

## 2015-10-25 NOTE — Anesthesia Procedure Notes (Signed)
Spinal  Patient location during procedure: OR Start time: 10/25/2015 9:10 AM End time: 10/25/2015 9:19 AM Staffing Resident/CRNA: Laureen Frederic Performed: resident/CRNA  Preanesthetic Checklist Completed: patient identified, site marked, surgical consent, pre-op evaluation, timeout performed, IV checked, risks and benefits discussed and monitors and equipment checked Spinal Block Patient position: sitting Prep: Betadine Patient monitoring: heart rate, cardiac monitor, continuous pulse ox and blood pressure Approach: midline Location: L3-4 Injection technique: single-shot Needle Needle type: Spinocan  Needle gauge: 22 G Needle length: 9 cm Needle insertion depth: 6 cm Assessment Sensory level: T6 Additional Notes -heme, -para, VSS.  Pt tolerated well.  Lot and expiration date ok.

## 2015-10-25 NOTE — Anesthesia Preprocedure Evaluation (Signed)
Anesthesia Evaluation  Patient identified by MRN, date of birth, ID band Patient awake    Reviewed: Allergy & Precautions, NPO status , Patient's Chart, lab work & pertinent test results  History of Anesthesia Complications (+) PONV and history of anesthetic complications  Airway Mallampati: II  TM Distance: >3 FB Neck ROM: Full    Dental no notable dental hx.    Pulmonary neg pulmonary ROS,    Pulmonary exam normal breath sounds clear to auscultation       Cardiovascular Exercise Tolerance: Good hypertension, Pt. on medications Normal cardiovascular exam Rhythm:Regular Rate:Normal     Neuro/Psych negative neurological ROS  negative psych ROS   GI/Hepatic Neg liver ROS, GERD  Medicated,  Endo/Other  Hypothyroidism   Renal/GU negative Renal ROS  negative genitourinary   Musculoskeletal  (+) Arthritis ,   Abdominal   Peds negative pediatric ROS (+)  Hematology negative hematology ROS (+)   Anesthesia Other Findings   Reproductive/Obstetrics negative OB ROS                             Anesthesia Physical  Anesthesia Plan  ASA: II  Anesthesia Plan: Spinal   Post-op Pain Management:    Induction: Intravenous  Airway Management Planned: Natural Airway  Additional Equipment:   Intra-op Plan:   Post-operative Plan:   Informed Consent: I have reviewed the patients History and Physical, chart, labs and discussed the procedure including the risks, benefits and alternatives for the proposed anesthesia with the patient or authorized representative who has indicated his/her understanding and acceptance.   Dental advisory given  Plan Discussed with: CRNA  Anesthesia Plan Comments: (Discussed risks and benefits of and differences between spinal and general. Discussed risks of spinal including headache, backache, failure, bleeding and hematoma, infection, and nerve damage and  paralysis. Patient consents to spinal. Questions answered. Coagulation studies and platelet count acceptable. )        Anesthesia Quick Evaluation

## 2015-10-25 NOTE — H&P (View-Only) (Signed)
Haley Schneider DOB: 01-05-1935 Widowed / Language: Cleophus Molt / Race: White Female Date of Admission:  10/25/2015 CC:  Left Knee Pain History of Present Illness  The patient is a 80 year old female who comes in for a preoperative History and Physical. The patient is scheduled for a left total knee arthroplasty to be performed by Dr. Dione Plover. Aluisio, MD at [facility] on 10-25-2015. The patient is a 80 year old female who presented for follow up of their knee. The patient is being followed for their left knee pain and osteoarthritis. They are now months out from Euflexxa series. Symptoms reported include: pain, aching, stiffness and grinding. The patient feels that they are doing poorly and report their pain level to be mild to moderate. The following medication has been used for pain control: antiinflammatory medication and Tramadol. The patient has indicated that they want to progress with surgery at this point. The viscosupplements were four months ago. She did not receive a great amount of benefit. The knee continues to get worse. She is getting progressive valgus in the knee. It is limiting her activities. She is ready to proceed with the procedure. They have been treated conservatively in the past for the above stated problem and despite conservative measures, they continue to have progressive pain and severe functional limitations and dysfunction. They have failed non-operative management including home exercise, medications, and injections. It is felt that they would benefit from undergoing total joint replacement. Risks and benefits of the procedure have been discussed with the patient and they elect to proceed with surgery. There are no active contraindications to surgery such as ongoing infection or rapidly progressive neurological disease.  Problem List/Past Medical Posterior tibial tendinitis of right lower extremity [id number])  Primary osteoarthritis of right hip (M16.11)   Primary osteoarthritis of right knee (M17.11)  Effusion of left knee (M25.462)  Chronic pain of left knee (M25.562)  Status post right hip replacement [id number])  Hypothyroidism  Osteoarthritis  Osteoporosis  High blood pressure  Impaired Vision  Cataract  Varicose veins  Urinary Incontinence  Urinary Tract Infection  Eczema  Allergies No Known Drug Allergies   Family History  Cancer  Father. Hypertension  Father. Rheumatoid Arthritis  Father.  Social History  Current work status  retired Furniture conservator/restorer weekly; does other Living situation  live alone Children  2 Tobacco use  Never smoker. 03/18/2014 Not under pain contract  Marital status  widowed Never consumed alcohol  03/18/2014: Never consumed alcohol No history of drug/alcohol rehab  Advance Directives  Living Will, Healthcare POA  Medication History Tramadol Active. Aspirin (81MG  Tablet, Oral occasionally) Active. Raloxifene HCl (60MG  Tablet, Oral) Active. Losartan Potassium (Oral) Specific strength unknown - Active. Synthroid (50MCG Tablet, Oral) Active. AmLODIPine Besylate (5MG  Tablet, Oral) Active. Vitamin B-12 (1000MCG Tablet, 5000 Oral daily) Active. Super B Complaex Active. Magnesium (Oral) Specific strength unknown - Active. (with zinc) Biotin (5000MCG Tablet, 10,000 Oral) Active. Vitamin C ER (1000MG  Tablet ER, Oral) Active. Vitamin A & D (Oral) Active. Vitamin D-3 (1000UNIT Capsule, Oral) Active. Coral Calcium Active. Turmeric Active. Omega-3 Krill Oil (3x Oral weekly) Specific strength unknown - Active. Advil (200MG  Tablet, Oral) Active. Lutein (20MG  Tablet, Oral) Active.  Past Surgical History Thyroidectomy; Subtotal  Date: 1965. Tonsillectomy  Total Hip Replacement  left Cataract Surgery  bilateral Inguinal Hernia Repair  open: left Thyroglosal Gland Removal  Total Hip Replacement - Right    Review of Systems General Present-  Fatigue. Not Present- Chills, Fever,  Memory Loss, Night Sweats, Weight Gain and Weight Loss. Skin Not Present- Eczema, Hives, Itching, Lesions and Rash. HEENT Not Present- Dentures, Double Vision, Headache, Hearing Loss, Tinnitus and Visual Loss. Respiratory Not Present- Allergies, Chronic Cough, Coughing up blood, Shortness of breath at rest and Shortness of breath with exertion. Cardiovascular Not Present- Chest Pain, Difficulty Breathing Lying Down, Murmur, Palpitations, Racing/skipping heartbeats and Swelling. Gastrointestinal Not Present- Abdominal Pain, Bloody Stool, Constipation, Diarrhea, Difficulty Swallowing, Heartburn, Jaundice, Loss of appetitie, Nausea and Vomiting. Female Genitourinary Present- Incontinence, Urinary frequency and Urinating at Night. Not Present- Blood in Urine, Discharge, Flank Pain, Painful Urination, Urgency, Urinary Retention and Weak urinary stream. Musculoskeletal Present- Joint Pain and Muscle Weakness. Not Present- Back Pain, Joint Swelling, Morning Stiffness, Muscle Pain and Spasms. Neurological Not Present- Blackout spells, Difficulty with balance, Dizziness, Paralysis, Tremor and Weakness. Psychiatric Not Present- Insomnia.  Vitals  10/05/2015 3:42 PM Weight: 150 lb Height: 65.5in Body Surface Area: 1.76 m Body Mass Index: 24.58 kg/m  Pulse: 84 (Regular)  BP: 128/72 (Sitting, Right Arm, Standard)   Physical Exam  General Mental Status -Alert, cooperative and good historian. General Appearance-pleasant, Not in acute distress. Orientation-Oriented X3. Build & Nutrition-Well nourished and Well developed.  Head and Neck Head-normocephalic, atraumatic . Neck Global Assessment - supple, no bruit auscultated on the right, no bruit auscultated on the left.  Eye Pupil - Bilateral-Regular and Round. Motion - Bilateral-EOMI.  Chest and Lung Exam Auscultation Breath sounds - clear at anterior chest wall and clear at posterior  chest wall. Adventitious sounds - No Adventitious sounds.  Cardiovascular Auscultation Rhythm - Regular rate and rhythm. Heart Sounds - S1 WNL and S2 WNL. Murmurs & Other Heart Sounds - Auscultation of the heart reveals - No Murmurs.  Abdomen Palpation/Percussion Tenderness - Abdomen is non-tender to palpation. Rigidity (guarding) - Abdomen is soft. Auscultation Auscultation of the abdomen reveals - Bowel sounds normal.  Female Genitourinary Note: Not done, not pertinent to present illness   Musculoskeletal Note: On exam, she is in no distress. Her right hip can be flexed to 120, rotated in 30, out 40, abducted 40 without discomfort. She is slightly tender over the right greater trochanter and that reproduces most of her pain. Her left knee which we are replacing next month has a significant valgus deformity and when she is walking, she is putting a lot of extra stress on that right hip.  RADIOGRAPHS She did have radiographs, AP pelvis and lateral of the right hip. AP of the right hip showing a prosthesis in excellent position with no periprosthetic abnormalities.   Assessment & Plan Primary osteoarthritis of left knee (M17.12) Status post right hip replacement [id number]) Status post left hip replacement [id number])  Note:Surgical Plans: Left Total Knee Replacement  Disposition: Home  PCP: Dr. Sanda Klein  IV TXA  Anesthesia Issues: None  Signed electronically by Ok Edwards, III PA-C

## 2015-10-25 NOTE — Interval H&P Note (Signed)
History and Physical Interval Note:  10/25/2015 6:48 AM  Haley Schneider  has presented today for surgery, with the diagnosis of LEFT KNEE OA   The various methods of treatment have been discussed with the patient and family. After consideration of risks, benefits and other options for treatment, the patient has consented to  Procedure(s): LEFT TOTAL KNEE ARTHROPLASTY (Left) as a surgical intervention .  The patient's history has been reviewed, patient examined, no change in status, stable for surgery.  I have reviewed the patient's chart and labs.  Questions were answered to the patient's satisfaction.     Gearlean Alf

## 2015-10-25 NOTE — Transfer of Care (Signed)
Immediate Anesthesia Transfer of Care Note  Patient: Haley Schneider  Procedure(s) Performed: Procedure(s): LEFT TOTAL KNEE ARTHROPLASTY (Left)  Patient Location: PACU  Anesthesia Type:Spinal  Level of Consciousness: awake, alert  and oriented  Airway & Oxygen Therapy: Patient Spontanous Breathing and Patient connected to face mask oxygen  Post-op Assessment: Report given to RN and Post -op Vital signs reviewed and stable  Post vital signs: Reviewed and stable  Last Vitals:  Vitals:   10/25/15 0719  BP: (!) 157/94  Pulse: (!) 101  Resp: 16  Temp: 36.6 C    Last Pain:  Vitals:   10/25/15 0719  TempSrc: Oral      Patients Stated Pain Goal: 3 ([id number])  Complications: No apparent anesthesia complications

## 2015-10-25 NOTE — Evaluation (Signed)
Physical Therapy Evaluation Patient Details Name: Haley Schneider MRN: [id number] DOB: 1935/10/03 Today's Date: 10/25/2015   History of Present Illness  L TKA; PMH of B THA, pelvic fx  Clinical Impression  Pt is s/p TKA resulting in the deficits listed below (see PT Problem List). Pt ambulated 30' with RW, performed TKA exercises with min A. She is independent with SLR. Good progress expected.  Pt will benefit from skilled PT to increase their independence and safety with mobility to allow discharge to the venue listed below.      Follow Up Recommendations Home health PT    Equipment Recommendations  None recommended by PT    Recommendations for Other Services       Precautions / Restrictions Precautions Precautions: Knee Precaution Comments: instructed pt to not place pillow under L knee Restrictions Weight Bearing Restrictions: No      Mobility  Bed Mobility Overal bed mobility: Needs Assistance Bed Mobility: Supine to Sit     Supine to sit: Min assist     General bed mobility comments: min A to raise trunk, instructed pt to self assist LLE with RLE for OOB  Transfers Overall transfer level: Needs assistance Equipment used: Rolling walker (2 wheeled) Transfers: Sit to/from Stand Sit to Stand: Min assist         General transfer comment: VCs hand placement, min A to rise  Ambulation/Gait Ambulation/Gait assistance: Min guard Ambulation Distance (Feet): 30 Feet   Gait Pattern/deviations: Step-to pattern;Decreased step length - left;Decreased step length - right;Antalgic   Gait velocity interpretation: Below normal speed for age/gender General Gait Details: VCs sequencing, steady with RW, no LOB, 5/10 L knee pain with walking  Stairs            Wheelchair Mobility    Modified Rankin (Stroke Patients Only)       Balance Overall balance assessment: Modified Independent                                           Pertinent  Vitals/Pain Pain Assessment: 0-10 Pain Score: 5  Pain Location: L knee with walking Pain Descriptors / Indicators: Sore Pain Intervention(s): Limited activity within patient's tolerance;Monitored during session;Premedicated before session;Ice applied    Home Living Family/patient expects to be discharged to:: Private residence Living Arrangements: Children Available Help at Discharge: Family;Available 24 hours/day Type of Home: House Home Access: Stairs to enter Entrance Stairs-Rails: Chemical engineer of Steps: 5 Home Layout: Two level;Able to live on main level with bedroom/bathroom Home Equipment: Gilford Rile - 2 wheels;Cane - single point;Bedside commode;Hospital bed Additional Comments: daughter will stay with pt for 1 week     Prior Function Level of Independence: Independent               Hand Dominance        Extremity/Trunk Assessment   Upper Extremity Assessment: Overall WFL for tasks assessed           Lower Extremity Assessment: LLE deficits/detail   LLE Deficits / Details: SLR 3/5, L knee 5-45* AAROM, ankle WNL  Cervical / Trunk Assessment: Normal  Communication   Communication: No difficulties  Cognition Arousal/Alertness: Awake/alert Behavior During Therapy: WFL for tasks assessed/performed Overall Cognitive Status: Within Functional Limits for tasks assessed                      General  Comments      Exercises Total Joint Exercises Ankle Circles/Pumps: AROM;Both;10 reps;Supine Quad Sets: AROM;Both;10 reps;Supine Heel Slides: AAROM;Left;10 reps;Supine Straight Leg Raises: AROM;Left;5 reps;Supine Long Arc Quad: AROM;Left;5 reps;Seated Goniometric ROM: 5-45* AAROM L knee   Assessment/Plan    PT Assessment Patient needs continued PT services  PT Problem List Decreased strength;Decreased range of motion;Decreased activity tolerance;Decreased mobility;Pain          PT Treatment Interventions DME instruction;Gait  training;Stair training;Functional mobility training;Therapeutic exercise;Therapeutic activities;Patient/family education    PT Goals (Current goals can be found in the Care Plan section)  Acute Rehab PT Goals Patient Stated Goal: to travel out [place] PT Goal Formulation: With patient/family Time For Goal Achievement: 11/08/15 Potential to Achieve Goals: Good    Frequency 7X/week   Barriers to discharge        Co-evaluation               End of Session Equipment Utilized During Treatment: Gait belt Activity Tolerance: Patient tolerated treatment well Patient left: in chair;with call bell/phone within reach;with family/visitor present Nurse Communication: Mobility status         Time: [id number] PT Time Calculation (min) (ACUTE ONLY): 19 min   Charges:   PT Evaluation $PT Eval Low Complexity: 1 Procedure     PT G CodesPhilomena Doheny 10/25/2015, 3:43 PM [phone]

## 2015-10-25 NOTE — Op Note (Signed)
OPERATIVE REPORT-TOTAL KNEE ARTHROPLASTY   Pre-operative diagnosis- Osteoarthritis  Left knee(s)  Post-operative diagnosis- Osteoarthritis Left knee(s)  Procedure-  Left  Total Knee Arthroplasty  Surgeon- Haley Plover. Kyung Muto, MD  Assistant- Ardeen Jourdain, PA-C   Anesthesia-  Spinal  EBL-* No blood loss amount entered *   Drains Hemovac  Tourniquet time- 28 minutes @ XX123456 mm Hg   Complications- None  Condition-PACU - hemodynamically stable.   Brief Clinical Note  Haley Schneider is a 80 y.o. year old female with end stage OA of her left knee with progressively worsening pain and dysfunction. She has constant pain, with activity and at rest and significant functional deficits with difficulties even with ADLs. She has had extensive non-op management including analgesics, injections of cortisone and viscosupplements, and home exercise program, but remains in significant pain with significant dysfunction. Radiographs show bone on bone arthritis lateral and patellofemoral. She presents now for left Total Knee Arthroplasty.    Procedure in detail---   The patient is brought into the operating room and positioned supine on the operating table. After successful administration of  Spinal,   a tourniquet is placed high on the  Left thigh(s) and the lower extremity is prepped and draped in the usual sterile fashion. Time out is performed by the operating team and then the  Left lower extremity is wrapped in Esmarch, knee flexed and the tourniquet inflated to 300 mmHg.       A midline incision is made with a ten blade through the subcutaneous tissue to the level of the extensor mechanism. A fresh blade is used to make a medial parapatellar arthrotomy. Soft tissue over the proximal medial tibia is subperiosteally elevated to the joint line with a knife and into the semimembranosus bursa with a Cobb elevator. Soft tissue over the proximal lateral tibia is elevated with attention being paid to  avoiding the patellar tendon on the tibial tubercle. The patella is everted, knee flexed 90 degrees and the ACL and PCL are removed. Findings are bone on bone lateral and patellofemoral with large lateral and patellar osteophytes.        The drill is used to create a starting hole in the distal femur and the canal is thoroughly irrigated with sterile saline to remove the fatty contents. The 5 degree Left  valgus alignment guide is placed into the femoral canal and the distal femoral cutting block is pinned to remove 10 mm off the distal femur. Resection is made with an oscillating saw.      The tibia is subluxed forward and the menisci are removed. The extramedullary alignment guide is placed referencing proximally at the medial aspect of the tibial tubercle and distally along the second metatarsal axis and tibial crest. The block is pinned to remove 57mm off the more deficient lateral  side. Resection is made with an oscillating saw. Size 3is the most appropriate size for the tibia and the proximal tibia is prepared with the modular drill and keel punch for that size.      The femoral sizing guide is placed and size 3 is most appropriate. Rotation is marked off the epicondylar axis and confirmed by creating a rectangular flexion gap at 90 degrees. The size 3 cutting block is pinned in this rotation and the anterior, posterior and chamfer cuts are made with the oscillating saw. The intercondylar block is then placed and that cut is made.      Trial size 3 tibial component, trial size 3 posterior  stabilized femur and a 10  mm posterior stabilized rotating platform insert trial is placed. Full extension is achieved with excellent varus/valgus and anterior/posterior balance throughout full range of motion. The patella is everted and thickness measured to be 22  mm. Free hand resection is taken to 12 mm, a 38 template is placed, lug holes are drilled, trial patella is placed, and it tracks normally. Osteophytes are  removed off the posterior femur with the trial in place. All trials are removed and the cut bone surfaces prepared with pulsatile lavage. Cement is mixed and once ready for implantation, the size 3 tibial implant, size  3 posterior stabilized femoral component, and the size 38 patella are cemented in place and the patella is held with the clamp. The trial insert is placed and the knee held in full extension. The Exparel (20 ml mixed with 30 ml saline) and .25% Bupivicaine, are injected into the extensor mechanism, posterior capsule, medial and lateral gutters and subcutaneous tissues.  All extruded cement is removed and once the cement is hard the permanent 10 mm posterior stabilized rotating platform insert is placed into the tibial tray.      The wound is copiously irrigated with saline solution and the extensor mechanism closed over a hemovac drain with #1 V-loc suture. The tourniquet is released for a total tourniquet time of 28  minutes. Flexion against gravity is 140 degrees and the patella tracks normally. Subcutaneous tissue is closed with 2.0 vicryl and subcuticular with running 4.0 Monocryl. The incision is cleaned and dried and steri-strips and a bulky sterile dressing are applied. The limb is placed into a knee immobilizer and the patient is awakened and transported to recovery in stable condition.      Please note that a surgical assistant was a medical necessity for this procedure in order to perform it in a safe and expeditious manner. Surgical assistant was necessary to retract the ligaments and vital neurovascular structures to prevent injury to them and also necessary for proper positioning of the limb to allow for anatomic placement of the prosthesis.   Haley Plover Jandel Patriarca, MD    10/25/2015, 10:10 AM

## 2015-10-26 LAB — BASIC METABOLIC PANEL
ANION GAP: 3 — AB (ref 5–15)
BUN: 7 mg/dL (ref 6–20)
CALCIUM: 9 mg/dL (ref 8.9–10.3)
CHLORIDE: 105 mmol/L (ref 101–111)
CO2: 27 mmol/L (ref 22–32)
Creatinine, Ser: 0.49 mg/dL (ref 0.44–1.00)
GFR calc Af Amer: >60 mL/min (ref >60)
GFR calc non Af Amer: >60 mL/min (ref >60)
GLUCOSE: 108 mg/dL — AB (ref 65–99)
POTASSIUM: 4 mmol/L (ref 3.5–5.1)
Sodium: 135 mmol/L (ref 135–145)

## 2015-10-26 LAB — CBC
HEMATOCRIT: 31.3 % — AB (ref 36.0–46.0)
HEMOGLOBIN: 10.5 g/dL — AB (ref 12.0–15.0)
MCH: 29 pg (ref 26.0–34.0)
MCHC: 33.5 g/dL (ref 30.0–36.0)
MCV: 86.5 fL (ref 78.0–100.0)
Platelets: 242 10*3/uL (ref 150–400)
RBC: 3.62 MIL/uL — AB (ref 3.87–5.11)
RDW: 14.3 % (ref 11.5–15.5)
WBC: 11.7 10*3/uL — AB (ref 4.0–10.5)

## 2015-10-26 MED ORDER — TRAMADOL HCL 50 MG PO TABS: 50.0000 mg | ORAL_TABLET | Freq: Four times a day (QID) | ORAL | 1 refills | Status: DC | PRN

## 2015-10-26 MED ORDER — OXYCODONE HCL 5 MG PO TABS: 5.0000 mg | ORAL_TABLET | ORAL | 0 refills | Status: DC | PRN

## 2015-10-26 MED ORDER — RIVAROXABAN 10 MG PO TABS: 10.0000 mg | ORAL_TABLET | Freq: Every day | ORAL | 0 refills | Status: DC

## 2015-10-26 MED ORDER — METHOCARBAMOL 500 MG PO TABS: 500.0000 mg | ORAL_TABLET | Freq: Four times a day (QID) | ORAL | 0 refills | Status: DC | PRN

## 2015-10-26 MED ORDER — ONDANSETRON HCL 4 MG PO TABS: 4.0000 mg | ORAL_TABLET | Freq: Four times a day (QID) | ORAL | 0 refills | Status: DC | PRN

## 2015-10-26 NOTE — Progress Notes (Signed)
12:00: Spoke with Manuela Schwartz, Rep for [facility] to confirm HHPT at discharge. No further CM needs at this time.

## 2015-10-26 NOTE — Progress Notes (Signed)
Physical Therapy Treatment Patient Details Name: Haley Schneider MRN: [id number] DOB: 03-24-1935 Today's Date: 10/26/2015    History of Present Illness  L TKR    PT Comments    Pt was seen in recliner upon arrival. Pt is post-op day 1 of a L TKR.  Performed transfers requiring min guard with VC's for safe hand placement while standing and sitting. Educated pt on sequencing and foot placement within the walker during ambulation. Pain increased from a 4-4.5 during ambulation.    Follow Up Recommendations   Home-health PT     Equipment Recommendations    Pt has RW and bed-side commode at home.    Recommendations for Other Services       Precautions / Restrictions Precautions Precautions: Knee;Fall Precaution Comments: Educated pt on how to don and doff knee immobilizer and schedule for ice application. Required Braces or Orthoses: Knee Immobilizer - Left Knee Immobilizer - Left: Discontinue once straight leg raise with < 10 degree lag Restrictions Weight Bearing Restrictions: No    Mobility  Bed Mobility                  Transfers Overall transfer level: Needs assistance Equipment used: Rolling walker (2 wheeled) Transfers: Sit to/from Stand Sit to Stand: Min guard         General transfer comment: Provided VC's for safe hand and RLE when ascending and descending from the chair.   Ambulation/Gait Ambulation/Gait assistance: Min guard Ambulation Distance (Feet): 60 Feet Assistive device: Rolling walker (2 wheeled) Gait Pattern/deviations: Decreased step length - right;Decreased step length - left;Decreased stance time - right;Decreased stance time - left;Antalgic;Step-to pattern Gait velocity: decreased Gait velocity interpretation: Below normal speed for age/gender General Gait Details: VC's provided for proper sequencing during ambulation and safe foot placement within the walker.    Stairs            Wheelchair Mobility    Modified Rankin  (Stroke Patients Only)       Balance                                    Cognition Arousal/Alertness: Awake/alert Behavior During Therapy: WFL for tasks assessed/performed Overall Cognitive Status: Within Functional Limits for tasks assessed                      Exercises Total Joint Exercises Ankle Circles/Pumps: Both;AROM;10 reps Quad Sets: AROM;Both;10 reps Towel Squeeze: AROM;10 reps;Both Heel Slides: AAROM;Left;10 reps;Seated (seated while reclined in chair. )    General Comments        Pertinent Vitals/Pain Pain Assessment: 0-10 Pain Score: 4  Pain Location: L knee. Pain increased to a 4.5 during ambulation.  Pain Descriptors / Indicators: Discomfort Pain Intervention(s): Premedicated before session;Monitored during session;Ice applied    Home Living                      Prior Function            PT Goals (current goals can now be found in the care plan section) Progress towards PT goals: Progressing toward goals    Frequency           PT Plan      Co-evaluation             End of Session Equipment Utilized During Treatment: Gait belt Activity Tolerance: Patient tolerated treatment well Patient  left: in chair;with call bell/phone within reach     Time: 1017-1040 PT Time Calculation (min) (ACUTE ONLY): 23 min  Charges:  $Gait Training: 8-22 mins $Therapeutic Exercise: 8-22 mins                    G Codes:      Hall Busing, SPTA [facility] Acute Rehab [phone]  Present and agree with above  Rica Koyanagi  PTA [facility]  Acute  Rehab Pager      [phone]

## 2015-10-26 NOTE — Progress Notes (Signed)
OT Cancellation Note  Patient Details Name: Haley Schneider MRN: [id number] DOB: [birth year]   Cancelled Treatment:    Reason Eval/Treat Not Completed: OT screened, no needs identified, will sign off. Patient recalls all ADL techniques from prior hip surgery last year. Has all necessary DME and will have assistance from family at discharge. Will sign off.  Renisha Cockrum A 10/26/2015, 9:43 AM

## 2015-10-26 NOTE — Care Management Note (Signed)
Case Management Note  Patient Details  Name: RAYLI OQUENDO MRN: [id number] Date of Birth: 10/10/1935  Subjective/Objective:                    Action/Plan: Manuela Schwartz, rep for [facility] notified of [place] needs. No further CM needs.    Expected Discharge Date:                  Expected Discharge Plan:  [place]  In-House Referral:  NA  Discharge planning Services  CM Consult  Post Acute Care Choice:  Home Health Choice offered to:  Patient  DME Arranged:   (has RW; 3in1) DME Agency:     HH Arranged:    [place] Agency:  [place]  Status of Service:  In process, will continue to follow  If discussed at Long Length of Stay Meetings, dates discussed:    Additional Comments:  Delrae Sawyers, RN 10/26/2015, 12:30 PM

## 2015-10-26 NOTE — Discharge Summary (Signed)
Physician Discharge Summary   Patient ID: Haley Schneider MRN: [id number] DOB/AGE: 1935-03-12 80 y.o.  Admit date: 10/25/2015 Discharge date: 10/27/2015  Primary Diagnosis:  Osteoarthritis  Left knee(s)  Admission Diagnoses:  Past Medical History:  Diagnosis Date  . Arthritis   . Female bladder prolapse   . Fibroid, uterine   . GERD (gastroesophageal reflux disease)   . History of abnormal cervical Pap smear   . History of frequent urinary tract infections   . History of urinary tract infection   . Hypercholesteremia   . Hypertension   . Hypothyroid   . Menopause   . Nocturia   . OP (osteoporosis)   . Pessary maintenance    # 5 ring with support  . PONV (postoperative nausea and vomiting)    many yrs ago  . Urethral caruncle   . Urinary incontinence   . Urinary urgency   . Vaginal atrophy   . Vaginal pessary present    remains in place.  . Vaginal vault prolapse    Discharge Diagnoses:   Active Problems:   OA (osteoarthritis) of knee  Estimated body mass index is 24.58 kg/m as calculated from the following:   Height as of this encounter: 5' 5.5" (1.664 m).   Weight as of this encounter: 68 kg (150 lb).  Procedure:  Procedure(s) (LRB): LEFT TOTAL KNEE ARTHROPLASTY (Left)   Consults: None  HPI: Haley Schneider is a 80 y.o. year old female with end stage OA of her left knee with progressively worsening pain and dysfunction. She has constant pain, with activity and at rest and significant functional deficits with difficulties even with ADLs. She has had extensive non-op management including analgesics, injections of cortisone and viscosupplements, and home exercise program, but remains in significant pain with significant dysfunction. Radiographs show bone on bone arthritis lateral and patellofemoral. She presents now for left Total Knee Arthroplasty.    Laboratory Data: Admission on 10/25/2015  Component Date Value Ref Range Status  . WBC 10/26/2015 11.7* 4.0 -  10.5 K/uL Final  . RBC 10/26/2015 3.62* 3.87 - 5.11 MIL/uL Final  . Hemoglobin 10/26/2015 10.5* 12.0 - 15.0 g/dL Final  . HCT 10/26/2015 31.3* 36.0 - 46.0 % Final  . MCV 10/26/2015 86.5  78.0 - 100.0 fL Final  . MCH 10/26/2015 29.0  26.0 - 34.0 pg Final  . MCHC 10/26/2015 33.5  30.0 - 36.0 g/dL Final  . RDW 10/26/2015 14.3  11.5 - 15.5 % Final  . Platelets 10/26/2015 242  150 - 400 K/uL Final  . Sodium 10/26/2015 135  135 - 145 mmol/L Final  . Potassium 10/26/2015 4.0  3.5 - 5.1 mmol/L Final  . Chloride 10/26/2015 105  101 - 111 mmol/L Final  . CO2 10/26/2015 27  22 - 32 mmol/L Final  . Glucose, Bld 10/26/2015 108* 65 - 99 mg/dL Final  . BUN 10/26/2015 7  6 - 20 mg/dL Final  . Creatinine, Ser 10/26/2015 0.49  0.44 - 1.00 mg/dL Final  . Calcium 10/26/2015 9.0  8.9 - 10.3 mg/dL Final  . GFR calc non Af Amer 10/26/2015 >60  >60 mL/min Final  . GFR calc Af Amer 10/26/2015 >60  >60 mL/min Final   Comment: (NOTE) The eGFR has been calculated using the CKD EPI equation. This calculation has not been validated in all clinical situations. eGFR's persistently <60 mL/min signify possible Chronic Kidney Disease.   Georgiann Hahn gap 10/26/2015 3* 5 - 15 Final  Hospital Outpatient Visit on 10/18/2015  Component Date Value Ref Range Status  . aPTT 10/18/2015 30  24 - 36 seconds Final  . WBC 10/18/2015 9.0  4.0 - 10.5 K/uL Final  . RBC 10/18/2015 4.80  3.87 - 5.11 MIL/uL Final  . Hemoglobin 10/18/2015 13.6  12.0 - 15.0 g/dL Final  . HCT 10/18/2015 41.6  36.0 - 46.0 % Final  . MCV 10/18/2015 86.7  78.0 - 100.0 fL Final  . MCH 10/18/2015 28.3  26.0 - 34.0 pg Final  . MCHC 10/18/2015 32.7  30.0 - 36.0 g/dL Final  . RDW 10/18/2015 14.2  11.5 - 15.5 % Final  . Platelets 10/18/2015 325  150 - 400 K/uL Final  . Sodium 10/18/2015 137  135 - 145 mmol/L Final  . Potassium 10/18/2015 5.1  3.5 - 5.1 mmol/L Final  . Chloride 10/18/2015 103  101 - 111 mmol/L Final  . CO2 10/18/2015 28  22 - 32 mmol/L Final  .  Glucose, Bld 10/18/2015 91  65 - 99 mg/dL Final  . BUN 10/18/2015 10  6 - 20 mg/dL Final  . Creatinine, Ser 10/18/2015 0.63  0.44 - 1.00 mg/dL Final  . Calcium 10/18/2015 10.5* 8.9 - 10.3 mg/dL Final  . Total Protein 10/18/2015 6.9  6.5 - 8.1 g/dL Final  . Albumin 10/18/2015 4.1  3.5 - 5.0 g/dL Final  . AST 10/18/2015 21  15 - 41 U/L Final  . ALT 10/18/2015 12* 14 - 54 U/L Final  . Alkaline Phosphatase 10/18/2015 77  38 - 126 U/L Final  . Total Bilirubin 10/18/2015 0.7  0.3 - 1.2 mg/dL Final  . GFR calc non Af Amer 10/18/2015 >60  >60 mL/min Final  . GFR calc Af Amer 10/18/2015 >60  >60 mL/min Final   Comment: (NOTE) The eGFR has been calculated using the CKD EPI equation. This calculation has not been validated in all clinical situations. eGFR's persistently <60 mL/min signify possible Chronic Kidney Disease.   . Anion gap 10/18/2015 6  5 - 15 Final  . Prothrombin Time 10/18/2015 13.1  11.4 - 15.2 seconds Final  . INR 10/18/2015 0.99   Final  . ABO/RH(D) 10/25/2015 O NEG   Final  . Antibody Screen 10/25/2015 NEG   Final  . Sample Expiration 10/25/2015 10/28/2015   Final  . Extend sample reason 10/25/2015 NO TRANSFUSIONS OR PREGNANCY IN THE PAST 3 MONTHS   Final  . Color, Urine 10/18/2015 YELLOW  YELLOW Final  . APPearance 10/18/2015 CLEAR  CLEAR Final  . Specific Gravity, Urine 10/18/2015 1.006  1.005 - 1.030 Final  . pH 10/18/2015 6.5  5.0 - 8.0 Final  . Glucose, UA 10/18/2015 NEGATIVE  NEGATIVE mg/dL Final  . Hgb urine dipstick 10/18/2015 TRACE* NEGATIVE Final  . Bilirubin Urine 10/18/2015 NEGATIVE  NEGATIVE Final  . Ketones, ur 10/18/2015 NEGATIVE  NEGATIVE mg/dL Final  . Protein, ur 10/18/2015 NEGATIVE  NEGATIVE mg/dL Final  . Nitrite 10/18/2015 NEGATIVE  NEGATIVE Final  . Leukocytes, UA 10/18/2015 MODERATE* NEGATIVE Final  . MRSA, PCR 10/18/2015 NEGATIVE  NEGATIVE Final  . Staphylococcus aureus 10/18/2015 NEGATIVE  NEGATIVE Final   Comment:        The Xpert SA Assay  (FDA approved for NASAL specimens in patients over 30 years of age), is one component of a comprehensive surveillance program.  Test performance has been validated by [facility] for patients greater than or equal to 59 year old. It is not intended to diagnose infection nor to guide or monitor treatment.   Marland Kitchen  Squamous Epithelial / LPF 10/18/2015 0-5* NONE SEEN Final  . WBC, UA 10/18/2015 6-30  0 - 5 WBC/hpf Final  . RBC / HPF 10/18/2015 0-5  0 - 5 RBC/hpf Final  . Bacteria, UA 10/18/2015 FEW* NONE SEEN Final  Orders Only on 09/02/2015  Component Date Value Ref Range Status  . Sodium 09/14/2015 135  135 - 146 mmol/L Final  . Color, Urine 09/14/2015 YELLOW  YELLOW Final  . APPearance 09/14/2015 CLEAR  CLEAR Final  . Specific Gravity, Urine 09/14/2015 1.011  1.001 - 1.035 Final  . pH 09/14/2015 7.0  5.0 - 8.0 Final  . Glucose, UA 09/14/2015 NEGATIVE  NEGATIVE Final  . Bilirubin Urine 09/14/2015 NEGATIVE  NEGATIVE Final  . Ketones, ur 09/14/2015 NEGATIVE  NEGATIVE Final  . Hgb urine dipstick 09/14/2015 NEGATIVE  NEGATIVE Final  . Protein, ur 09/14/2015 NEGATIVE  NEGATIVE Final  . Nitrite 09/14/2015 NEGATIVE  NEGATIVE Final  . Leukocytes, UA 09/14/2015 2+* NEGATIVE Final  . WBC, UA 09/14/2015 0-5  <=5 WBC/HPF Final  . RBC / HPF 09/14/2015 0-2  <=2 RBC/HPF Final  . Squamous Epithelial / LPF 09/14/2015 0-5  <=5 HPF Final  . Bacteria, UA 09/14/2015 NONE SEEN  NONE SEEN HPF Final  . Crystals 09/14/2015 NONE SEEN  NONE SEEN HPF Final  . Casts 09/14/2015 NONE SEEN  NONE SEEN LPF Final  . Yeast 09/14/2015 NONE SEEN  NONE SEEN HPF Final  . Organism ID, Bacteria 09/15/2015 Three or more organisms present,each greater than   Final  . Organism ID, Bacteria 09/15/2015 10,000 CFU/mL.These organisms,commonly found on   Final  . Organism ID, Bacteria 09/15/2015 external and internal genitalia,are considered to   Final  . Organism ID, Bacteria 09/15/2015 be colonizers.No further testing performed.    Final  Office Visit on 09/01/2015  Component Date Value Ref Range Status  . WBC 09/01/2015 8.8  3.8 - 10.8 K/uL Final  . RBC 09/01/2015 5.05  3.80 - 5.10 MIL/uL Final  . Hemoglobin 09/01/2015 14.1  11.7 - 15.5 g/dL Final  . HCT 09/01/2015 42.6  35.0 - 45.0 % Final  . MCV 09/01/2015 84.4  80.0 - 100.0 fL Final  . MCH 09/01/2015 27.9  27.0 - 33.0 pg Final  . MCHC 09/01/2015 33.1  32.0 - 36.0 g/dL Final  . RDW 09/01/2015 14.6  11.0 - 15.0 % Final  . Platelets 09/01/2015 341  140 - 400 K/uL Final  . MPV 09/01/2015 10.2  7.5 - 12.5 fL Final  . Neutro Abs 09/01/2015 5984  1,500 - 7,800 cells/uL Final  . Lymphs Abs 09/01/2015 2024  850 - 3,900 cells/uL Final  . Monocytes Absolute 09/01/2015 704  200 - 950 cells/uL Final  . Eosinophils Absolute 09/01/2015 88  15 - 500 cells/uL Final  . Basophils Absolute 09/01/2015 0  0 - 200 cells/uL Final  . Neutrophils Relative % 09/01/2015 68  % Final  . Lymphocytes Relative 09/01/2015 23  % Final  . Monocytes Relative 09/01/2015 8  % Final  . Eosinophils Relative 09/01/2015 1  % Final  . Basophils Relative 09/01/2015 0  % Final  . Smear Review 09/01/2015 Criteria for review not met   Final  . Cholesterol 09/01/2015 203* 125 - 200 mg/dL Final  . Triglycerides 09/01/2015 162* <150 mg/dL Final  . HDL 09/01/2015 64  >=46 mg/dL Final  . Total CHOL/HDL Ratio 09/01/2015 3.2  <=5.0 Ratio Final  . VLDL 09/01/2015 32* <30 mg/dL Final  . LDL Cholesterol 09/01/2015 107  <130  mg/dL Final   Comment:   Total Cholesterol/HDL Ratio:CHD Risk                        Coronary Heart Disease Risk Table                                        Men       Women          1/2 Average Risk              3.4        3.3              Average Risk              5.0        4.4           2X Average Risk              9.6        7.1           3X Average Risk             23.4       11.0 Use the calculated Patient Ratio above and the CHD Risk table  to determine the patient's CHD Risk.    . Sodium 09/01/2015 131* 135 - 146 mmol/L Final  . Potassium 09/01/2015 4.5  3.5 - 5.3 mmol/L Final  . Chloride 09/01/2015 99  98 - 110 mmol/L Final  . CO2 09/01/2015 24  20 - 31 mmol/L Final  . Glucose, Bld 09/01/2015 85  65 - 99 mg/dL Final  . BUN 09/01/2015 10  7 - 25 mg/dL Final  . Creat 09/01/2015 0.64  0.60 - 0.93 mg/dL Final   Comment:   For patients > or = 78 years of age: The upper reference limit for Creatinine is approximately 13% higher for people identified as African-American.     . Total Bilirubin 09/01/2015 0.5  0.2 - 1.2 mg/dL Final  . Alkaline Phosphatase 09/01/2015 85  33 - 130 U/L Final  . AST 09/01/2015 14  10 - 35 U/L Final  . ALT 09/01/2015 10  6 - 29 U/L Final  . Total Protein 09/01/2015 6.8  6.1 - 8.1 g/dL Final  . Albumin 09/01/2015 4.0  3.6 - 5.1 g/dL Final  . Calcium 09/01/2015 10.4  8.6 - 10.4 mg/dL Final  . GFR, Est African American 09/01/2015 >89  >=60 mL/min Final  . GFR, Est Non African American 09/01/2015 86  >=60 mL/min Final  . TSH 09/01/2015 1.34  mIU/L Final   Comment:   Reference Range   > or = 20 Years  0.40-4.50   Pregnancy Range First trimester  0.26-2.66 Second trimester 0.55-2.73 Third trimester  0.43-2.91     . Phosphorus 09/01/2015 3.0  2.1 - 4.3 mg/dL Final  . Vit D, 25-Hydroxy 09/02/2015 34  30 - 100 ng/mL Final   Comment: Vitamin D Status           25-OH Vitamin D        Deficiency                <20 ng/mL        Insufficiency         20 - 29 ng/mL        Optimal             >  or = 30 ng/mL   For 25-OH Vitamin D testing on patients on D2-supplementation and patients for whom quantitation of D2 and D3 fractions is required, the QuestAssureD 25-OH VIT D, (D2,D3), LC/MS/MS is recommended: order code [id number] (patients > 2 yrs).   . Color, Urine 09/02/2015 YELLOW  YELLOW Final  . APPearance 09/02/2015 CLEAR  CLEAR Final  . Specific Gravity, Urine 09/02/2015 1.012  1.001 - 1.035 Final  . pH 09/02/2015 7.0  5.0 - 8.0 Final  .  Glucose, UA 09/02/2015 NEGATIVE  NEGATIVE Final  . Bilirubin Urine 09/02/2015 NEGATIVE  NEGATIVE Final  . Ketones, ur 09/02/2015 NEGATIVE  NEGATIVE Final  . Hgb urine dipstick 09/02/2015 NEGATIVE  NEGATIVE Final  . Protein, ur 09/02/2015 NEGATIVE  NEGATIVE Final  . Nitrite 09/02/2015 NEGATIVE  NEGATIVE Final  . Leukocytes, UA 09/02/2015 2+* NEGATIVE Final  . WBC, UA 09/02/2015 0-5  <=5 WBC/HPF Final  . RBC / HPF 09/02/2015 3-10* <=2 RBC/HPF Final  . Squamous Epithelial / LPF 09/02/2015 0-5  <=5 HPF Final  . Bacteria, UA 09/02/2015 NONE SEEN  NONE SEEN HPF Final  . Crystals 09/02/2015 NONE SEEN  NONE SEEN HPF Final  . Casts 09/02/2015 NONE SEEN  NONE SEEN LPF Final  . Yeast 09/02/2015 NONE SEEN  NONE SEEN HPF Final  . Organism ID, Bacteria 09/03/2015 Single organism less than 10,000 CFU/mL isolated.   Final  . Organism ID, Bacteria 09/03/2015 These organisms,commonly found on external and   Final  . Organism ID, Bacteria 09/03/2015 internal genitalia,are considered colonizers.   Final  . Organism ID, Bacteria 09/03/2015 No further testing performed.   Final     X-Rays:No results found.  EKG: Orders placed or performed during the hospital encounter of 10/18/15  . EKG 12-Lead  . EKG 12-Lead     Hospital Course: Haley Schneider is a 80 y.o. who was admitted to [facility]. They were brought to the operating room on 10/25/2015 and underwent Procedure(s): LEFT TOTAL KNEE ARTHROPLASTY.  Patient tolerated the procedure well and was later transferred to the recovery room and then to the orthopaedic floor for postoperative care.  They were given PO and IV analgesics for pain control following their surgery.  They were given 24 hours of postoperative antibiotics of  Anti-infectives    Start     Dose/Rate Route Frequency Ordered Stop   10/25/15 1600  ceFAZolin (ANCEF) IVPB 2g/100 mL premix     2 g 200 mL/hr over 30 Minutes Intravenous Every 6 hours 10/25/15 1236 10/25/15 2223    10/25/15 0638  ceFAZolin (ANCEF) IVPB 2g/100 mL premix     2 g 200 mL/hr over 30 Minutes Intravenous On call to O.R. 10/25/15 1173 10/25/15 5670     and started on DVT prophylaxis in the form of Xarelto.   PT and OT were ordered for total joint protocol.  Discharge planning consulted to help with postop disposition and equipment needs.  Patient had a good night on the evening of surgery.  They started to get up OOB with therapy on day one. Hemovac drain was pulled without difficulty.  Continued to work with therapy into day two.  Dressing was changed on day two and the incision was healing well.  Patient was seen in rounds on POD 2 and was ready to go home.   Diet: Cardiac diet Activity:WBAT Follow-up:in 2 weeks Disposition - Home Discharged Condition: good   Discharge Instructions    Call MD / Call 911  Complete by:  As directed    If you experience chest pain or shortness of breath, CALL 911 and be transported to the hospital emergency room.  If you develope a fever above 101 F, pus (white drainage) or increased drainage or redness at the wound, or calf pain, call your surgeon's office.   Change dressing    Complete by:  As directed    Change dressing daily with sterile 4 x 4 inch gauze dressing and apply TED hose. Do not submerge the incision under water.   Constipation Prevention    Complete by:  As directed    Drink plenty of fluids.  Prune juice may be helpful.  You may use a stool softener, such as Colace (over the counter) 100 mg twice a day.  Use MiraLax (over the counter) for constipation as needed.   Diet - low sodium heart healthy    Complete by:  As directed    Discharge instructions    Complete by:  As directed    Pick up stool softner and laxative for home use following surgery while on pain medications. Do not submerge incision under water. Please use good hand washing techniques while changing dressing each day. May shower starting three days after surgery. Please  use a clean towel to pat the incision dry following showers. Continue to use ice for pain and swelling after surgery. Do not use any lotions or creams on the incision until instructed by your surgeon.   Postoperative Constipation Protocol  Constipation - defined medically as fewer than three stools per week and severe constipation as less than one stool per week.  One of the most common issues patients have following surgery is constipation.  Even if you have a regular bowel pattern at home, your normal regimen is likely to be disrupted due to multiple reasons following surgery.  Combination of anesthesia, postoperative narcotics, change in appetite and fluid intake all can affect your bowels.  In order to avoid complications following surgery, here are some recommendations in order to help you during your recovery period.  Colace (docusate) - Pick up an over-the-counter form of Colace or another stool softener and take twice a day as long as you are requiring postoperative pain medications.  Take with a full glass of water daily.  If you experience loose stools or diarrhea, hold the colace until you stool forms back up.  If your symptoms do not get better within 1 week or if they get worse, check with your doctor.  Dulcolax (bisacodyl) - Pick up over-the-counter and take as directed by the product packaging as needed to assist with the movement of your bowels.  Take with a full glass of water.  Use this product as needed if not relieved by Colace only.   MiraLax (polyethylene glycol) - Pick up over-the-counter to have on hand.  MiraLax is a solution that will increase the amount of water in your bowels to assist with bowel movements.  Take as directed and can mix with a glass of water, juice, soda, coffee, or tea.  Take if you go more than two days without a movement. Do not use MiraLax more than once per day. Call your doctor if you are still constipated or irregular after using this medication for  7 days in a row.  If you continue to have problems with postoperative constipation, please contact the office for further assistance and recommendations.  If you experience "the worst abdominal pain ever" or develop nausea or  vomiting, please contact the office immediatly for further recommendations for treatment.   Take Xarelto for two and a half more weeks, then discontinue Xarelto. Once the patient has completed the Xarelto, they may resume the 81 mg Aspirin.   Do not put a pillow under the knee. Place it under the heel.    Complete by:  As directed    Do not sit on low chairs, stoools or toilet seats, as it may be difficult to get up from low surfaces    Complete by:  As directed    Driving restrictions    Complete by:  As directed    No driving until released by the physician.   Increase activity slowly as tolerated    Complete by:  As directed    Lifting restrictions    Complete by:  As directed    No lifting until released by the physician.   Patient may shower    Complete by:  As directed    You may shower without a dressing once there is no drainage.  Do not wash over the wound.  If drainage remains, do not shower until drainage stops.   TED hose    Complete by:  As directed    Use stockings (TED hose) for 3 weeks on both leg(s).  You may remove them at night for sleeping.   Weight bearing as tolerated    Complete by:  As directed    Laterality:  left   Extremity:  Lower       Medication List    STOP taking these medications   aspirin EC 81 MG tablet   ibuprofen 200 MG tablet Commonly known as:  ADVIL,MOTRIN   multivitamin tablet     TAKE these medications   losartan 50 MG tablet Commonly known as:  COZAAR TAKE ONE (1) TABLET BY MOUTH EVERY DAY   methocarbamol 500 MG tablet Commonly known as:  ROBAXIN Take 1 tablet (500 mg total) by mouth every 6 (six) hours as needed for muscle spasms.   ondansetron 4 MG tablet Commonly known as:  ZOFRAN Take 1 tablet  (4 mg total) by mouth every 6 (six) hours as needed for nausea.   oxyCODONE 5 MG immediate release tablet Commonly known as:  Oxy IR/ROXICODONE Take 1-2 tablets (5-10 mg total) by mouth every 3 (three) hours as needed for moderate pain or severe pain.   raloxifene 60 MG tablet Commonly known as:  EVISTA Take 60 mg by mouth daily.   rivaroxaban 10 MG Tabs tablet Commonly known as:  XARELTO Take 1 tablet (10 mg total) by mouth daily with breakfast. Take Xarelto for two and a half more weeks, then discontinue Xarelto. Once the patient has completed the Xarelto, they may resume the 81 mg Aspirin. Start taking on:  10/27/2015   SYNTHROID 50 MCG tablet Generic drug:  levothyroxine TAKE ONE (1) TABLET BY MOUTH EVERY DAY   traMADol 50 MG tablet Commonly known as:  ULTRAM Take 1-2 tablets (50-100 mg total) by mouth every 6 (six) hours as needed (mild pain).      Follow-up Information    [facility] .   Why:  [facility] has been ordered by your MD. A representative will be in touch with you within 12-24 hours to arrange your initial visit.  Contact information: [address] [place] [address] [phone]        Gearlean Alf, MD. Schedule an appointment as soon as possible for a visit  on 11/09/2015.   Specialty:  Orthopedic Surgery Contact information: [address] [place] [address] [phone]           Signed: Arlee Muslim, PA-C Orthopaedic Surgery 10/26/2015, 10:06 PM

## 2015-10-26 NOTE — Progress Notes (Signed)
   Subjective: 1 Day Post-Op Procedure(s) (LRB): LEFT TOTAL KNEE ARTHROPLASTY (Left) Patient reports pain as mild.   Patient seen in rounds for Dr. Wynelle Link.  Was doing very good on morning rounds today. Patient is well, but has had some minor complaints of pain in the knee, requiring pain medications We will start therapy today.  Plan is to go Home after hospital stay.  Objective: Vital signs in last 24 hours: Temp:  [98 F (36.7 C)-98.8 F (37.1 C)] 98.4 F (36.9 C) (10/24 1350) Pulse Rate:  [60-72] 71 (10/24 1350) Resp:  [15-18] 16 (10/24 1350) BP: (103-135)/(51-76) 135/76 (10/24 1350) SpO2:  [93 %-100 %] 93 % (10/24 1350)  Intake/Output from previous day:  Intake/Output Summary (Last 24 hours) at 10/26/15 2200 Last data filed at 10/26/15 1922  Gross per 24 hour  Intake             2460 ml  Output             1115 ml  Net             1345 ml    Intake/Output this shift: Total I/O In: 240 [P.O.:240] Out: -   Labs:  Recent Labs  10/26/15 0409  HGB 10.5*    Recent Labs  10/26/15 0409  WBC 11.7*  RBC 3.62*  HCT 31.3*  PLT 242    Recent Labs  10/26/15 0409  NA 135  K 4.0  CL 105  CO2 27  BUN 7  CREATININE 0.49  GLUCOSE 108*  CALCIUM 9.0   No results for input(s): LABPT, INR in the last 72 hours.  EXAM General - Patient is Alert, Appropriate and Oriented Extremity - Neurovascular intact Sensation intact distally Intact pulses distally Dorsiflexion/Plantar flexion intact Dressing - dressing C/D/I Motor Function - intact, moving foot and toes well on exam.  Hemovac pulled without difficulty.  Past Medical History:  Diagnosis Date  . Arthritis   . Female bladder prolapse   . Fibroid, uterine   . GERD (gastroesophageal reflux disease)   . History of abnormal cervical Pap smear   . History of frequent urinary tract infections   . History of urinary tract infection   . Hypercholesteremia   . Hypertension   . Hypothyroid   . Menopause   .  Nocturia   . OP (osteoporosis)   . Pessary maintenance    # 5 ring with support  . PONV (postoperative nausea and vomiting)    many yrs ago  . Urethral caruncle   . Urinary incontinence   . Urinary urgency   . Vaginal atrophy   . Vaginal pessary present    remains in place.  . Vaginal vault prolapse     Assessment/Plan: 1 Day Post-Op Procedure(s) (LRB): LEFT TOTAL KNEE ARTHROPLASTY (Left) Active Problems:   OA (osteoarthritis) of knee  Estimated body mass index is 24.58 kg/m as calculated from the following:   Height as of this encounter: 5' 5.5" (1.664 m).   Weight as of this encounter: 68 kg (150 lb). Advance diet Up with therapy Plan for discharge tomorrow Discharge home with home health  DVT Prophylaxis - Xarelto Weight-Bearing as tolerated to left leg D/C O2 and Pulse OX and try on Room Air  Arlee Muslim, PA-C Orthopaedic Surgery 10/26/2015, 10:00 PM

## 2015-10-26 NOTE — Discharge Instructions (Addendum)
° °Dr. Frank Aluisio °Total Joint Specialist °[facility] °[address] °[place], [place] [address] °[phone] ° °TOTAL KNEE REPLACEMENT POSTOPERATIVE DIRECTIONS ° °Knee Rehabilitation, Guidelines Following Surgery  °Results after knee surgery are often greatly improved when you follow the exercise, range of motion and muscle strengthening exercises prescribed by your doctor. Safety measures are also important to protect the knee from further injury. Any time any of these exercises cause you to have increased pain or swelling in your knee joint, decrease the amount until you are comfortable again and slowly increase them. If you have problems or questions, call your caregiver or physical therapist for advice.  ° °HOME CARE INSTRUCTIONS  °Remove items at home which could result in a fall. This includes throw rugs or furniture in walking pathways.  °· ICE to the affected knee every three hours for 30 minutes at a time and then as needed for pain and swelling.  Continue to use ice on the knee for pain and swelling from surgery. You may notice swelling that will progress down to the foot and ankle.  This is normal after surgery.  Elevate the leg when you are not up walking on it.   °· Continue to use the breathing machine which will help keep your temperature down.  It is common for your temperature to cycle up and down following surgery, especially at night when you are not up moving around and exerting yourself.  The breathing machine keeps your lungs expanded and your temperature down. °· Do not place pillow under knee, focus on keeping the knee straight while resting ° °DIET °You may resume your previous home diet once your are discharged from the hospital. ° °DRESSING / WOUND CARE / SHOWERING °You may shower 3 days after surgery, but keep the wounds dry during showering.  You may use an occlusive plastic wrap (Press'n Seal for example), NO SOAKING/SUBMERGING IN THE BATHTUB.  If the  bandage gets wet, change with a clean dry gauze.  If the incision gets wet, pat the wound dry with a clean towel. °You may start showering once you are discharged home but do not submerge the incision under water. Just pat the incision dry and apply a dry gauze dressing on daily. °Change the surgical dressing daily and reapply a dry dressing each time. ° °ACTIVITY °Walk with your walker as instructed. °Use walker as long as suggested by your caregivers. °Avoid periods of inactivity such as sitting longer than an hour when not asleep. This helps prevent blood clots.  °You may resume a sexual relationship in one month or when given the OK by your doctor.  °You may return to work once you are cleared by your doctor.  °Do not drive a car for 6 weeks or until released by you surgeon.  °Do not drive while taking narcotics. ° °WEIGHT BEARING °Weight bearing as tolerated with assist device (walker, cane, etc) as directed, use it as long as suggested by your surgeon or therapist, typically at least 4-6 weeks. ° °POSTOPERATIVE CONSTIPATION PROTOCOL °Constipation - defined medically as fewer than three stools per week and severe constipation as less than one stool per week. ° °One of the most common issues patients have following surgery is constipation.  Even if you have a regular bowel pattern at home, your normal regimen is likely to be disrupted due to multiple reasons following surgery.  Combination of anesthesia, postoperative narcotics, change in appetite and fluid intake all can affect your bowels.    In order to avoid complications following surgery, here are some recommendations in order to help you during your recovery period. ° °Colace (docusate) - Pick up an over-the-counter form of Colace or another stool softener and take twice a day as long as you are requiring postoperative pain medications.  Take with a full glass of water daily.  If you experience loose stools or diarrhea, hold the colace until you stool forms  back up.  If your symptoms do not get better within 1 week or if they get worse, check with your doctor. ° °Dulcolax (bisacodyl) - Pick up over-the-counter and take as directed by the product packaging as needed to assist with the movement of your bowels.  Take with a full glass of water.  Use this product as needed if not relieved by Colace only.  ° °MiraLax (polyethylene glycol) - Pick up over-the-counter to have on hand.  MiraLax is a solution that will increase the amount of water in your bowels to assist with bowel movements.  Take as directed and can mix with a glass of water, juice, soda, coffee, or tea.  Take if you go more than two days without a movement. °Do not use MiraLax more than once per day. Call your doctor if you are still constipated or irregular after using this medication for 7 days in a row. ° °If you continue to have problems with postoperative constipation, please contact the office for further assistance and recommendations.  If you experience "the worst abdominal pain ever" or develop nausea or vomiting, please contact the office immediatly for further recommendations for treatment. ° °ITCHING ° If you experience itching with your medications, try taking only a single pain pill, or even half a pain pill at a time.  You can also use Benadryl over the counter for itching or also to help with sleep.  ° °TED HOSE STOCKINGS °Wear the elastic stockings on both legs for three weeks following surgery during the day but you may remove then at night for sleeping. ° °MEDICATIONS °See your medication summary on the “After Visit Summary” that the nursing staff will review with you prior to discharge.  You may have some home medications which will be placed on hold until you complete the course of blood thinner medication.  It is important for you to complete the blood thinner medication as prescribed by your surgeon.  Continue your approved medications as instructed at time of  discharge. ° °PRECAUTIONS °If you experience chest pain or shortness of breath - call 911 immediately for transfer to the hospital emergency department.  °If you develop a fever greater that 101 F, purulent drainage from wound, increased redness or drainage from wound, foul odor from the wound/dressing, or calf pain - CONTACT YOUR SURGEON.   °                                                °FOLLOW-UP APPOINTMENTS °Make sure you keep all of your appointments after your operation with your surgeon and caregivers. You should call the office at the above phone number and make an appointment for approximately two weeks after the date of your surgery or on the date instructed by your surgeon outlined in the "After Visit Summary". ° ° °RANGE OF MOTION AND STRENGTHENING EXERCISES  °Rehabilitation of the knee is important following a knee injury or   an operation. After just a few days of immobilization, the muscles of the thigh which control the knee become weakened and shrink (atrophy). Knee exercises are designed to build up the tone and strength of the thigh muscles and to improve knee motion. Often times heat used for twenty to thirty minutes before working out will loosen up your tissues and help with improving the range of motion but do not use heat for the first two weeks following surgery. These exercises can be done on a training (exercise) mat, on the floor, on a table or on a bed. Use what ever works the best and is most comfortable for you Knee exercises include:  °Leg Lifts - While your knee is still immobilized in a splint or cast, you can do straight leg raises. Lift the leg to 60 degrees, hold for 3 sec, and slowly lower the leg. Repeat 10-20 times 2-3 times daily. Perform this exercise against resistance later as your knee gets better.  °Quad and Hamstring Sets - Tighten up the muscle on the front of the thigh (Quad) and hold for 5-10 sec. Repeat this 10-20 times hourly. Hamstring sets are done by pushing the  foot backward against an object and holding for 5-10 sec. Repeat as with quad sets.  °· Leg Slides: Lying on your back, slowly slide your foot toward your buttocks, bending your knee up off the floor (only go as far as is comfortable). Then slowly slide your foot back down until your leg is flat on the floor again. °· Angel Wings: Lying on your back spread your legs to the side as far apart as you can without causing discomfort.  °A rehabilitation program following serious knee injuries can speed recovery and prevent re-injury in the future due to weakened muscles. Contact your doctor or a physical therapist for more information on knee rehabilitation.  ° °IF YOU ARE TRANSFERRED TO A SKILLED REHAB FACILITY °If the patient is transferred to a skilled rehab facility following release from the hospital, a list of the current medications will be sent to the facility for the patient to continue.  When discharged from the skilled rehab facility, please have the facility set up the patient's Home Health Physical Therapy prior to being released. Also, the skilled facility will be responsible for providing the patient with their medications at time of release from the facility to include their pain medication, the muscle relaxants, and their blood thinner medication. If the patient is still at the rehab facility at time of the two week follow up appointment, the skilled rehab facility will also need to assist the patient in arranging follow up appointment in our office and any transportation needs. ° °MAKE SURE YOU:  °Understand these instructions.  °Get help right away if you are not doing well or get worse.  ° ° °Pick up stool softner and laxative for home use following surgery while on pain medications. °Do not submerge incision under water. °Please use good hand washing techniques while changing dressing each day. °May shower starting three days after surgery. °Please use a clean towel to pat the incision dry following  showers. °Continue to use ice for pain and swelling after surgery. °Do not use any lotions or creams on the incision until instructed by your surgeon. ° °Take Xarelto for two and a half more weeks, then discontinue Xarelto. °Once the patient has completed the Xarelto, they may resume the 81 mg Aspirin. ° ° °Information on my medicine - XARELTO® (Rivaroxaban) ° °  This medication education was reviewed with me or my healthcare representative as part of my discharge preparation.  The pharmacist that spoke with me during my hospital stay was:  Nicole Dunn, Student-PharmD ° °Why was Xarelto® prescribed for you? °Xarelto® was prescribed for you to reduce the risk of blood clots forming after orthopedic surgery. The medical term for these abnormal blood clots is venous thromboembolism (VTE). ° °What do you need to know about xarelto® ? °Take your Xarelto® ONCE DAILY at the same time every day. °You may take it either with or without food. ° °If you have difficulty swallowing the tablet whole, you may crush it and mix in applesauce just prior to taking your dose. ° °Take Xarelto® exactly as prescribed by your doctor and DO NOT stop taking Xarelto® without talking to the doctor who prescribed the medication.  Stopping without other VTE prevention medication to take the place of Xarelto® may increase your risk of developing a clot. ° °After discharge, you should have regular check-up appointments with your healthcare provider that is prescribing your Xarelto®.   ° °What do you do if you miss a dose? °If you miss a dose, take it as soon as you remember on the same day then continue your regularly scheduled once daily regimen the next day. Do not take two doses of Xarelto® on the same day.  ° °Important Safety Information °A possible side effect of Xarelto® is bleeding. You should call your healthcare provider right away if you experience any of the following: °? Bleeding from an injury or your nose that does not  stop. °? Unusual colored urine (red or dark brown) or unusual colored stools (red or black). °? Unusual bruising for unknown reasons. °? A serious fall or if you hit your head (even if there is no bleeding). ° °Some medicines may interact with Xarelto® and might increase your risk of bleeding while on Xarelto®. To help avoid this, consult your healthcare provider or pharmacist prior to using any new prescription or non-prescription medications, including herbals, vitamins, non-steroidal anti-inflammatory drugs (NSAIDs) and supplements. ° °This website has more information on Xarelto®: www.xarelto.com. ° ° ° °

## 2015-10-26 NOTE — Care Management Note (Signed)
Case Management Note  Patient Details  Name: Haley Schneider MRN: [id number] Date of Birth: 03-08-1935  Subjective/Objective: 80 y.o. F admitted 10/25/2015 for L TKA. Pt has had both R and L THA in past and has RW and 3n1 at home. Choosing [facility] to provide HHPT at discharge. Pt has sister and daughter who will assist her in immediate postop period.                    Action/Plan: Discharge home with HHPT.    Expected Discharge Date:                  Expected Discharge Plan:     In-House Referral:     Discharge planning Services     Post Acute Care Choice:    Choice offered to:     DME Arranged:    DME Agency:     HH Arranged:    HH Agency:     Status of Service:     If discussed at [employer], dates discussed:    Additional Comments:  Delrae Sawyers, RN 10/26/2015, 9:30 AM

## 2015-10-26 NOTE — Anesthesia Postprocedure Evaluation (Signed)
Anesthesia Post Note  Patient: Haley Schneider  Procedure(s) Performed: Procedure(s) (LRB): LEFT TOTAL KNEE ARTHROPLASTY (Left)  Patient location during evaluation: PACU Anesthesia Type: Spinal and MAC Level of consciousness: awake and alert Pain management: pain level controlled Vital Signs Assessment: post-procedure vital signs reviewed and stable Respiratory status: spontaneous breathing and respiratory function stable Cardiovascular status: blood pressure returned to baseline and stable Postop Assessment: spinal receding Anesthetic complications: no     Last Vitals:  Vitals:   10/26/15 0518 10/26/15 0625  BP: (!) 127/51   Pulse: 66 72  Resp: 18 18  Temp: 36.9 C     Last Pain:  Vitals:   10/26/15 0625  TempSrc:   PainSc: 5    Pain Goal: Patients Stated Pain Goal: 3 (10/26/15 [id number])               Nolon Nations

## 2015-10-26 NOTE — Progress Notes (Signed)
Physical Therapy Treatment Patient Details Name: Haley Schneider MRN: [id number] DOB: 17-Dec-1935 Today's Date: 10/26/2015    History of Present Illness L TKA; PMH of B THA, pelvic fx    PT Comments    POD # 1 pm session Assisted with amb further distance then back to bed to perform TKR TE's followed by ICE. Pt progressing well.  Plans to D/XC to home tomorrow.  Follow Up Recommendations     Home Health   Equipment Recommendations       Recommendations for Other Services       Precautions / Restrictions Precautions Precautions: Knee;Fall Precaution Comments: instructed on KI use for amb and stairs Required Braces or Orthoses: Knee Immobilizer - Left Knee Immobilizer - Left: Discontinue once straight leg raise with < 10 degree lag Restrictions Weight Bearing Restrictions: No Other Position/Activity Restrictions: WBAT    Mobility  Bed Mobility Overal bed mobility: Needs Assistance Bed Mobility: Sit to Supine           General bed mobility comments: assisted back to bed  Transfers Overall transfer level: Needs assistance Equipment used: Rolling walker (2 wheeled) Transfers: Sit to/from Stand Sit to Stand: Supervision;Min guard         General transfer comment: Provided VC's for safe hand and RLE when ascending and descending from the chair.   Ambulation/Gait Ambulation/Gait assistance: Min guard Ambulation Distance (Feet): 75 Feet Assistive device: Rolling walker (2 wheeled) Gait Pattern/deviations: Step-to pattern;Step-through pattern Gait velocity: decreased Gait velocity interpretation: Below normal speed for age/gender General Gait Details: VC's provided for proper sequencing during ambulation and safe foot placement within the walker.    Stairs            Wheelchair Mobility    Modified Rankin (Stroke Patients Only)       Balance                                    Cognition Arousal/Alertness: Awake/alert Behavior  During Therapy: WFL for tasks assessed/performed Overall Cognitive Status: Within Functional Limits for tasks assessed                      Exercises Total Joint Exercises Ankle Circles/Pumps: Both;AROM;10 reps Quad Sets: AROM;Both;10 reps Towel Squeeze: AROM;10 reps;Both Heel Slides: AAROM;Left;10 reps;Seated (seated while reclined in chair. )    General Comments        Pertinent Vitals/Pain Pain Assessment: 0-10 Pain Score: 4  Pain Location: L knee Pain Descriptors / Indicators: Discomfort;Tender Pain Intervention(s): Monitored during session;Repositioned;Ice applied    Home Living                      Prior Function            PT Goals (current goals can now be found in the care plan section) Progress towards PT goals: Progressing toward goals    Frequency           PT Plan      Co-evaluation             End of Session Equipment Utilized During Treatment: Gait belt Activity Tolerance: Patient tolerated treatment well Patient left: in chair;with call bell/phone within reach     Time: 1430-1455 PT Time Calculation (min) (ACUTE ONLY): 25 min  Charges:  $Gait Training: 8-22 mins $Therapeutic Exercise: 8-22 mins  G Codes:      Rica Koyanagi  PTA [facility]  Acute  Rehab Pager      [phone]

## 2015-10-27 LAB — BASIC METABOLIC PANEL WITH GFR
Anion gap: 3 — ABNORMAL LOW (ref 5–15)
BUN: 11 mg/dL (ref 6–20)
CO2: 27 mmol/L (ref 22–32)
Calcium: 9.5 mg/dL (ref 8.9–10.3)
Chloride: 106 mmol/L (ref 101–111)
Creatinine, Ser: 0.58 mg/dL (ref 0.44–1.00)
GFR calc Af Amer: >60 mL/min
GFR calc non Af Amer: >60 mL/min
Glucose, Bld: 104 mg/dL — ABNORMAL HIGH (ref 65–99)
Potassium: 4 mmol/L (ref 3.5–5.1)
Sodium: 136 mmol/L (ref 135–145)

## 2015-10-27 LAB — CBC
HCT: 31.8 % — ABNORMAL LOW (ref 36.0–46.0)
Hemoglobin: 10.4 g/dL — ABNORMAL LOW (ref 12.0–15.0)
MCH: 28.3 pg (ref 26.0–34.0)
MCHC: 32.7 g/dL (ref 30.0–36.0)
MCV: 86.6 fL (ref 78.0–100.0)
PLATELETS: 271 10*3/uL (ref 150–400)
RBC: 3.67 MIL/uL — AB (ref 3.87–5.11)
RDW: 14.3 % (ref 11.5–15.5)
WBC: 13.2 10*3/uL — ABNORMAL HIGH (ref 4.0–10.5)

## 2015-10-27 NOTE — Progress Notes (Signed)
Physical Therapy Treatment Patient Details Name: Haley Schneider MRN: [id number] DOB: 06-25-1935 Today's Date: 10/27/2015    History of Present Illness L TKA; PMH of B THA, pelvic fx    PT Comments    POD # 2 am session Assisted out of bathroom, pt c/o nausea.  Amb a limited distance.  RN notified.    Follow Up Recommendations  Home health PT     Equipment Recommendations  None recommended by PT    Recommendations for Other Services       Precautions / Restrictions Precautions Precautions: Fall Restrictions Weight Bearing Restrictions: No Other Position/Activity Restrictions: WBAT    Mobility  Bed Mobility               General bed mobility comments: OOB in bathroom with NT  Transfers Overall transfer level: Needs assistance Equipment used: Rolling walker (2 wheeled) Transfers: Sit to/from Stand Sit to Stand: Supervision         General transfer comment: <25% VC's for safety with turns and L LE extnsion prior to sit.    Ambulation/Gait Ambulation/Gait assistance: Supervision;Min guard Ambulation Distance (Feet): 25 Feet Assistive device: Rolling walker (2 wheeled) Gait Pattern/deviations: Step-to pattern;Decreased stance time - right Gait velocity: decreased   General Gait Details: VC's provided for proper sequencing during ambulation and safe foot placement within the walker. Decreased amb distance due to increased c/o nausea.  RN informed.     Stairs            Wheelchair Mobility    Modified Rankin (Stroke Patients Only)       Balance                                    Cognition Arousal/Alertness: Awake/alert Behavior During Therapy: WFL for tasks assessed/performed Overall Cognitive Status: Within Functional Limits for tasks assessed                      Exercises      General Comments        Pertinent Vitals/Pain Pain Assessment: 0-10 Pain Score: 4  Pain Location: L knee Pain Descriptors /  Indicators: Discomfort;Sore Pain Intervention(s): Monitored during session;Repositioned;Ice applied    Home Living                      Prior Function            PT Goals (current goals can now be found in the care plan section) Progress towards PT goals: Progressing toward goals    Frequency    7X/week      PT Plan Current plan remains appropriate    Co-evaluation             End of Session Equipment Utilized During Treatment: Gait belt Activity Tolerance: Other (comment) (nausea) Patient left: in chair;with call bell/phone within reach     Time: 1129-1141 PT Time Calculation (min) (ACUTE ONLY): 12 min  Charges:  $Gait Training: 8-22 mins                    G Codes:      Rica Koyanagi  PTA [facility]  Acute  Rehab Pager      [phone]

## 2015-10-27 NOTE — Progress Notes (Signed)
Physical Therapy Treatment Patient Details Name: IYUNNA SCHLENDER MRN: [id number] DOB: 23-Jan-1935 Today's Date: 10/27/2015    History of Present Illness L TKA; PMH of B THA, pelvic fx    PT Comments    POD # 2 pm session Nausea better.  Ate some lunch.  Assisted with amb, practiced stairs then completed TKR TE's followed by ICE.  Pt was issued one crutch to take home for stairs.    Follow Up Recommendations  Home health PT     Equipment Recommendations  None recommended by PT    Recommendations for Other Services       Precautions / Restrictions Precautions Precautions: Fall Precaution Comments: instructed to wear KI during stairs for increased safety/support Restrictions Weight Bearing Restrictions: No Other Position/Activity Restrictions: WBAT    Mobility  Bed Mobility               General bed mobility comments: OOB in recliner  Transfers Overall transfer level: Needs assistance Equipment used: Rolling walker (2 wheeled) Transfers: Sit to/from Stand Sit to Stand: Supervision         General transfer comment: <25% VC's for safety with turns and L LE extnsion prior to sit.    Ambulation/Gait Ambulation/Gait assistance: Supervision Ambulation Distance (Feet): 45 Feet Assistive device: Rolling walker (2 wheeled) Gait Pattern/deviations: Step-to pattern;Decreased stance time - right Gait velocity: decreased   General Gait Details: one VC safety with turns using walking    Stairs Stairs: Yes Stairs assistance: Min guard Stair Management: One rail Right;Step to pattern;Forwards;With crutches Number of Stairs: 4 General stair comments: 25% VC's on proper tech, proper sequencing and use of one rail/one crutch.  Handout also given.  Wheelchair Mobility    Modified Rankin (Stroke Patients Only)       Balance                                    Cognition Arousal/Alertness: Awake/alert Behavior During Therapy: WFL for tasks  assessed/performed Overall Cognitive Status: Within Functional Limits for tasks assessed                      Exercises      General Comments        Pertinent Vitals/Pain Pain Assessment: 0-10 Pain Score: 7  Pain Location: L knee Pain Descriptors / Indicators: Discomfort;Sore;Tender Pain Intervention(s): Monitored during session;Repositioned;Ice applied    Home Living                      Prior Function            PT Goals (current goals can now be found in the care plan section) Progress towards PT goals: Progressing toward goals    Frequency    7X/week      PT Plan Current plan remains appropriate    Co-evaluation             End of Session Equipment Utilized During Treatment: Gait belt Activity Tolerance: Other (comment) (nausea) Patient left: in chair;with call bell/phone within reach     Time: 1345-1413 PT Time Calculation (min) (ACUTE ONLY): 28 min  Charges:  $Gait Training: 8-22 mins $Therapeutic Exercise: 8-22 mins                    G Codes:      Rica Koyanagi  PTA [facility]  Acute  Rehab Pager  [phone]  

## 2015-10-27 NOTE — Progress Notes (Signed)
   Subjective: 2 Days Post-Op Procedure(s) (LRB): LEFT TOTAL KNEE ARTHROPLASTY (Left) Patient reports pain as mild and moderate.   Patient seen in rounds with Dr. Wynelle Link.  Better today. Patient is well, but has had some minor complaints of pain in the knee, requiring pain medications Patient is ready to go home  Objective: Vital signs in last 24 hours: Temp:  [98.1 F (36.7 C)-98.5 F (36.9 C)] 98.1 F (36.7 C) (10/25 0522) Pulse Rate:  [66-76] 76 (10/25 0522) Resp:  [16] 16 (10/25 0522) BP: (133-153)/(58-76) 153/69 (10/25 0522) SpO2:  [93 %-94 %] 94 % (10/25 0522)  Intake/Output from previous day:  Intake/Output Summary (Last 24 hours) at 10/27/15 0934 Last data filed at 10/27/15 0934  Gross per 24 hour  Intake          1663.75 ml  Output              900 ml  Net           763.75 ml    Intake/Output this shift: Total I/O In: 120 [P.O.:120] Out: -   Labs:  Recent Labs  10/26/15 0409 10/27/15 0419  HGB 10.5* 10.4*    Recent Labs  10/26/15 0409 10/27/15 0419  WBC 11.7* 13.2*  RBC 3.62* 3.67*  HCT 31.3* 31.8*  PLT 242 271    Recent Labs  10/26/15 0409 10/27/15 0419  NA 135 136  K 4.0 4.0  CL 105 106  CO2 27 27  BUN 7 11  CREATININE 0.49 0.58  GLUCOSE 108* 104*  CALCIUM 9.0 9.5   No results for input(s): LABPT, INR in the last 72 hours.  EXAM: General - Patient is Alert, Appropriate and Oriented Extremity - Neurovascular intact Sensation intact distally Dorsiflexion/Plantar flexion intact Incision - clean, dry, no drainage Motor Function - intact, moving foot and toes well on exam.   Assessment/Plan: 2 Days Post-Op Procedure(s) (LRB): LEFT TOTAL KNEE ARTHROPLASTY (Left) Procedure(s) (LRB): LEFT TOTAL KNEE ARTHROPLASTY (Left) Past Medical History:  Diagnosis Date  . Arthritis   . Female bladder prolapse   . Fibroid, uterine   . GERD (gastroesophageal reflux disease)   . History of abnormal cervical Pap smear   . History of frequent  urinary tract infections   . History of urinary tract infection   . Hypercholesteremia   . Hypertension   . Hypothyroid   . Menopause   . Nocturia   . OP (osteoporosis)   . Pessary maintenance    # 5 ring with support  . PONV (postoperative nausea and vomiting)    many yrs ago  . Urethral caruncle   . Urinary incontinence   . Urinary urgency   . Vaginal atrophy   . Vaginal pessary present    remains in place.  . Vaginal vault prolapse    Active Problems:   OA (osteoarthritis) of knee  Estimated body mass index is 24.58 kg/m as calculated from the following:   Height as of this encounter: 5' 5.5" (1.664 m).   Weight as of this encounter: 68 kg (150 lb). Up with therapy Discharge home with home health Diet - Cardiac diet Follow up - in 2 weeks Activity - WBAT Disposition - Home Condition Upon Discharge - Good D/C Meds - See DC Summary DVT Prophylaxis - Xarelto  Arlee Muslim, PA-C Orthopaedic Surgery 10/27/2015, 9:34 AM

## 2015-11-30 ENCOUNTER — Other Ambulatory Visit: Payer: Self-pay | Admitting: Family Medicine

## 2015-12-01 NOTE — Telephone Encounter (Signed)
tsh Aug normal; rx approved

## 2015-12-14 ENCOUNTER — Other Ambulatory Visit: Payer: Self-pay | Admitting: Family Medicine

## 2015-12-14 NOTE — Telephone Encounter (Signed)
Please contact the patient Ask if she has had a DEXA scan more recently than 2004 (that's what is listed in health maintenance) I'll approve one more month of Evista, but would to get a DEXA scan if more than 2 years since last Please enter order if needed for "osteopenia" or track down last DEXA if one less than 78 years old Thank you

## 2016-01-08 ENCOUNTER — Other Ambulatory Visit: Payer: Self-pay | Admitting: Family Medicine

## 2016-01-08 NOTE — Telephone Encounter (Signed)
approved

## 2016-01-19 ENCOUNTER — Encounter: Payer: Medicare Other | Admitting: Obstetrics and Gynecology

## 2016-02-03 ENCOUNTER — Ambulatory Visit (INDEPENDENT_AMBULATORY_CARE_PROVIDER_SITE_OTHER): Payer: Medicare Other | Admitting: Obstetrics and Gynecology

## 2016-02-03 ENCOUNTER — Encounter: Payer: Self-pay | Admitting: Obstetrics and Gynecology

## 2016-02-03 VITALS — BP 129/75 | HR 84 | Ht 65.0 in | Wt 142.1 lb

## 2016-02-03 DIAGNOSIS — N952 Postmenopausal atrophic vaginitis: Secondary | ICD-10-CM

## 2016-02-03 DIAGNOSIS — Z4689 Encounter for fitting and adjustment of other specified devices: Secondary | ICD-10-CM | POA: Diagnosis not present

## 2016-02-03 DIAGNOSIS — N8111 Cystocele, midline: Secondary | ICD-10-CM

## 2016-02-03 NOTE — Progress Notes (Signed)
Chief complaint: 1. Pessary maintenance 2. Cystocele 2. Vaginal atrophy 4. History of Vaginal erosion  3 month pessary follow-up ( Last visit 10/14/2015) She is not reporting any vaginal bleeding or discharge. Patient desires to leave pessary out for the next several months. She does report occasional dribbling at night without UTI symptoms. She is now several months that is status post total knee replacement-doing well.  Past medical history, medications, and allergies are reviewed  OBJECTIVE: BP 129/75   Pulse 84   Ht 5\' 5"  (1.651 m)   Wt 142 lb 1.6 oz (64.5 kg)   BMI 23.65 kg/m   Pleasant elderly female in no acute distress. She is alert and oriented. Affect is appropriate. PELVIC: External Genitalia: Normal BUS: Normal Vagina: Atrophic changes; no epithelial ulceration;  cystocele Cervix: Absent Uterus: Absent Adnexa: N/A RV: N/A Bladder: Nontender PROCEDURE: Pessary is removed  ASSESSMENT: 1. Cystocele 2. Patient desires break from pessary use  PLAN: 1. Pessary is removed, cleaned, and not reinserted 2. Return in 3 months for follow-up and possible restart pessary use  A total of 15 minutes were spent face-to-face with the patient during this encounter and over half of that time dealt with counseling and coordination of care.  Brayton Mars, MD  Note: This dictation was prepared with Dragon dictation along with smaller phrase technology. Any transcriptional errors that result from this process are unintentional.

## 2016-02-03 NOTE — Patient Instructions (Addendum)
1.  Return in 3 months for pessary maintenance 

## 2016-03-08 ENCOUNTER — Ambulatory Visit (INDEPENDENT_AMBULATORY_CARE_PROVIDER_SITE_OTHER): Payer: Medicare Other | Admitting: Family Medicine

## 2016-03-08 ENCOUNTER — Encounter: Payer: Self-pay | Admitting: Family Medicine

## 2016-03-08 DIAGNOSIS — Z5181 Encounter for therapeutic drug level monitoring: Secondary | ICD-10-CM

## 2016-03-08 DIAGNOSIS — I10 Essential (primary) hypertension: Secondary | ICD-10-CM

## 2016-03-08 DIAGNOSIS — M129 Arthropathy, unspecified: Secondary | ICD-10-CM

## 2016-03-08 DIAGNOSIS — E890 Postprocedural hypothyroidism: Secondary | ICD-10-CM

## 2016-03-08 DIAGNOSIS — E782 Mixed hyperlipidemia: Secondary | ICD-10-CM

## 2016-03-08 NOTE — Assessment & Plan Note (Addendum)
Check labs; consider topical and turmeric

## 2016-03-08 NOTE — Patient Instructions (Signed)
Try to use PLAIN allergy medicine without the decongestant Avoid: phenylephrine, phenylpropanolamine, and pseudoephredine Your goal blood pressure is less than 140 mmHg on top. Try to follow the DASH guidelines (DASH stands for Dietary Approaches to Stop Hypertension) Try to limit the sodium in your diet.  Ideally, consume less than 1.5 grams (less than 1,500mg ) per day. Do not add salt when cooking or at the table.  Check the sodium amount on labels when shopping, and choose items lower in sodium when given a choice. Avoid or limit foods that already contain a lot of sodium. Eat a diet rich in fruits and vegetables and whole grains. Try to limit saturated fats in your diet (bologna, hot dogs, barbeque, cheeseburgers, hamburgers, steak, bacon, sausage, cheese, etc.) and get more fresh fruits, vegetables, and whole grains Try turmeric as a natural anti-inflammatory (for pain and arthritis). It comes in capsules where you buy aspirin and fish oil, but also as a spice where you buy pepper and garlic powder.

## 2016-03-08 NOTE — Progress Notes (Signed)
BP 126/76   Pulse 61   Temp 98.2 F (36.8 C) (Oral)   Resp 16   Wt 139 lb 12.8 oz (63.4 kg)   SpO2 94%   BMI 23.26 kg/m    Subjective:    Patient ID: Haley Schneider, female    DOB: Jun 10, 1935, 81 y.o.   MRN: [id number]  HPI: Haley Schneider is a 82 y.o. female  Chief Complaint  Patient presents with  . Follow-up   Patient is here for f/u Her arthritis is getting to her on wet cold days; uses Advil; when the weather is bad, it's her hands, wrists, elbows, and shoulders; she has had this for years; her father had it too; her 61 year old granddaughter had JRA  High cholesterol; last lipids were in August; had toast and cookie and coffee today; tries to avoid fatty meats; has pork sometimes; does eat some cheese, some lowfat; not many eggs Lab Results  Component Value Date   CHOL 203 (H) 09/01/2015   HDL 64 09/01/2015   LDLCALC 107 09/01/2015   TRIG 162 (H) 09/01/2015   CHOLHDL 3.2 09/01/2015   Hypothyroidism Lab Results  Component Value Date   TSH 1.34 09/01/2015  she has lost 11 pounds since having knee surgery; food just didn't taste good; appetite is good now; not gaining weight back; she is happy with her weight right now; not concerned; dose has been stable for years; BMs are normal  HTN; well-controlled; checks at home sometimes; gets 120s-130s  Seeing Dr. Keturah Barre for pessary; it's out right now for 3 months, and will go back later; she just wants a break  She has GERD: avoids spicy foods; coffee is a trigger; drinking half caf  Depression screen [facility] 2/9 03/08/2016 09/01/2015 03/10/2015 08/12/2014  Decreased Interest 0 0 0 0  Down, Depressed, Hopeless 0 0 0 0  PHQ - 2 Score 0 0 0 0   Relevant past medical, surgical, family and social history reviewed Past Medical History:  Diagnosis Date  . Arthritis   . Female bladder prolapse   . Fibroid, uterine   . GERD (gastroesophageal reflux disease)   . History of abnormal cervical Pap smear   . History of frequent  urinary tract infections   . History of urinary tract infection   . Hypercholesteremia   . Hypertension   . Hypothyroid   . Menopause   . Nocturia   . OP (osteoporosis)   . Pessary maintenance    # 5 ring with support  . PONV (postoperative nausea and vomiting)    many yrs ago  . Urethral caruncle   . Urinary incontinence   . Urinary urgency   . Vaginal atrophy   . Vaginal pessary present    remains in place.  . Vaginal vault prolapse    Past Surgical History:  Procedure Laterality Date  . CATARACT EXTRACTION    . EYE SURGERY     cataract surgery bilat   . EYE SURGERY     laser eye surgery left eye"retinal acclution"laser  . HERNIA REPAIR    . THYROGLOSSAL DUCT CYST    . THYROIDECTOMY    . TONSILLECTOMY    . TONSILLECTOMY    . TOTAL HIP ARTHROPLASTY     left hip 7 years ago   . TOTAL HIP ARTHROPLASTY Right 08/26/2014   Procedure: RIGHT TOTAL HIP ARTHROPLASTY ANTERIOR APPROACH;  Surgeon: Gaynelle Arabian, MD;  Location: [facility];  Service: Orthopedics;  Laterality: Right;  .  TOTAL KNEE ARTHROPLASTY Left 10/25/2015   Procedure: LEFT TOTAL KNEE ARTHROPLASTY;  Surgeon: Gaynelle Arabian, MD;  Location: [facility];  Service: Orthopedics;  Laterality: Left;   Family History  Problem Relation Age of Onset  . Alzheimer's disease Mother   . Cancer Father     lung  . Hypertension Father   . Stroke Paternal Grandfather   . Tuberculosis Paternal Grandmother   . Diabetes Son   . Breast cancer Paternal Aunt   . Hypertension Sister   . Hypertension Sister   . Heart disease Neg Hx    Social History  Substance Use Topics  . Smoking status: Never Smoker  . Smokeless tobacco: Never Used  . Alcohol use No    Interim medical history since last visit reviewed. Allergies and medications reviewed  Review of Systems Per HPI unless specifically indicated above     Objective:    BP 126/76   Pulse 61   Temp 98.2 F (36.8 C) (Oral)   Resp 16   Wt 139 lb 12.8 oz (63.4 kg)   SpO2 94%    BMI 23.26 kg/m   Wt Readings from Last 3 Encounters:  03/08/16 139 lb 12.8 oz (63.4 kg)  02/03/16 142 lb 1.6 oz (64.5 kg)  10/25/15 150 lb (68 kg)    Physical Exam  Constitutional: She appears well-developed and well-nourished. No distress.  HENT:  Head: Normocephalic and atraumatic.  Eyes: EOM are normal. No scleral icterus.  Neck: No thyromegaly present.  Cardiovascular: Normal rate, regular rhythm and normal heart sounds.   No murmur heard. Pulmonary/Chest: Effort normal and breath sounds normal. No respiratory distress. She has no wheezes.  Thoracic kyphosis  Abdominal: Soft. Bowel sounds are normal. She exhibits no distension.  Musculoskeletal: She exhibits no edema.       Left hand: She exhibits bony tenderness and deformity (swelling at the base of the left thumb; few heberden, bouchard nodes).  Neurological: She is alert. She exhibits normal muscle tone.  Skin: Skin is warm and dry. She is not diaphoretic. No pallor.  Psychiatric: She has a normal mood and affect. Her speech is normal and behavior is normal. Judgment and thought content normal. Her mood appears not anxious. Cognition and memory are normal. She does not exhibit a depressed mood.    Results for orders placed or performed during the hospital encounter of 10/25/15  CBC  Result Value Ref Range   WBC 11.7 (H) 4.0 - 10.5 K/uL   RBC 3.62 (L) 3.87 - 5.11 MIL/uL   Hemoglobin 10.5 (L) 12.0 - 15.0 g/dL   HCT 31.3 (L) 36.0 - 46.0 %   MCV 86.5 78.0 - 100.0 fL   MCH 29.0 26.0 - 34.0 pg   MCHC 33.5 30.0 - 36.0 g/dL   RDW 14.3 11.5 - 15.5 %   Platelets 242 150 - 400 K/uL  Basic metabolic panel  Result Value Ref Range   Sodium 135 135 - 145 mmol/L   Potassium 4.0 3.5 - 5.1 mmol/L   Chloride 105 101 - 111 mmol/L   CO2 27 22 - 32 mmol/L   Glucose, Bld 108 (H) 65 - 99 mg/dL   BUN 7 6 - 20 mg/dL   Creatinine, Ser 0.49 0.44 - 1.00 mg/dL   Calcium 9.0 8.9 - 10.3 mg/dL   GFR calc non Af Amer >60 >60 mL/min   GFR calc  Af Amer >60 >60 mL/min   Anion gap 3 (L) 5 - 15  CBC  Result  Value Ref Range   WBC 13.2 (H) 4.0 - 10.5 K/uL   RBC 3.67 (L) 3.87 - 5.11 MIL/uL   Hemoglobin 10.4 (L) 12.0 - 15.0 g/dL   HCT 31.8 (L) 36.0 - 46.0 %   MCV 86.6 78.0 - 100.0 fL   MCH 28.3 26.0 - 34.0 pg   MCHC 32.7 30.0 - 36.0 g/dL   RDW 14.3 11.5 - 15.5 %   Platelets 271 150 - 400 K/uL  Basic metabolic panel  Result Value Ref Range   Sodium 136 135 - 145 mmol/L   Potassium 4.0 3.5 - 5.1 mmol/L   Chloride 106 101 - 111 mmol/L   CO2 27 22 - 32 mmol/L   Glucose, Bld 104 (H) 65 - 99 mg/dL   BUN 11 6 - 20 mg/dL   Creatinine, Ser 0.58 0.44 - 1.00 mg/dL   Calcium 9.5 8.9 - 10.3 mg/dL   GFR calc non Af Amer >60 >60 mL/min   GFR calc Af Amer >60 >60 mL/min   Anion gap 3 (L) 5 - 15      Assessment & Plan:   Problem List Items Addressed This Visit      Cardiovascular and Mediastinum   Hypertension    Well-controlled; avoid salt and decongestants; try DASH guidelines      Relevant Medications   aspirin EC 81 MG tablet     Endocrine   Hypothyroidism    Check TSH today with weight loss and adjust dose if needed      Relevant Orders   TSH     Musculoskeletal and Integument   Arthritis, multiple joint involvement    Check labs; consider topical and turmeric      Relevant Orders   ANA w/Reflex if Positive     Other   Medication monitoring encounter    Check labs today      Relevant Orders   CBC with Differential/Platelet   Comprehensive Metabolic Panel (CMET)   Hyperlipidemia    Check lipids (nonfasting today); avoid saturated fats      Relevant Medications   aspirin EC 81 MG tablet   Other Relevant Orders   Lipid panel       Follow up plan: Return in about 6 months (around 09/08/2016) for fasting labs and visit; return next week for vaccine.  An after-visit summary was printed and given to the patient at [facility].  Please see the patient instructions which may contain other information and  recommendations beyond what is mentioned above in the assessment and plan.  Meds ordered this encounter  Medications  . aspirin EC 81 MG tablet    Sig: Take by mouth.    Orders Placed This Encounter  Procedures  . Lipid panel  . CBC with Differential/Platelet  . Comprehensive Metabolic Panel (CMET)  . ANA w/Reflex if Positive  . TSH

## 2016-03-08 NOTE — Assessment & Plan Note (Signed)
Check labs today.

## 2016-03-08 NOTE — Assessment & Plan Note (Signed)
Check lipids (nonfasting today); avoid saturated fats

## 2016-03-08 NOTE — Assessment & Plan Note (Signed)
Check TSH today with weight loss and adjust dose if needed

## 2016-03-08 NOTE — Assessment & Plan Note (Signed)
Well-controlled; avoid salt and decongestants; try DASH guidelines

## 2016-03-09 LAB — COMPREHENSIVE METABOLIC PANEL
A/G RATIO: 1.7 (ref 1.2–2.2)
ALBUMIN: 4.3 g/dL (ref 3.5–4.8)
ALK PHOS: 112 IU/L (ref 39–117)
ALT: 13 IU/L (ref 0–32)
AST: 18 IU/L (ref 0–40)
BUN / CREAT RATIO: 22 (ref 12–28)
BUN: 14 mg/dL (ref 8–27)
Bilirubin Total: 0.4 mg/dL (ref 0.0–1.2)
CO2: 24 mmol/L (ref 18–29)
CREATININE: 0.63 mg/dL (ref 0.57–1.00)
Calcium: 10.8 mg/dL — ABNORMAL HIGH (ref 8.7–10.3)
Chloride: 94 mmol/L — ABNORMAL LOW (ref 96–106)
GFR calc Af Amer: 99 mL/min/{1.73_m2} (ref >59)
GFR calc non Af Amer: 86 mL/min/{1.73_m2} (ref >59)
Globulin, Total: 2.6 g/dL (ref 1.5–4.5)
Glucose: 91 mg/dL (ref 65–99)
POTASSIUM: 4.6 mmol/L (ref 3.5–5.2)
SODIUM: 134 mmol/L (ref 134–144)
Total Protein: 6.9 g/dL (ref 6.0–8.5)

## 2016-03-09 LAB — CBC WITH DIFFERENTIAL/PLATELET
BASOS: 0 %
Basophils Absolute: 0 10*3/uL (ref 0.0–0.2)
EOS (ABSOLUTE): 0.1 10*3/uL (ref 0.0–0.4)
EOS: 1 %
HEMATOCRIT: 40.3 % (ref 34.0–46.6)
HEMOGLOBIN: 13.7 g/dL (ref 11.1–15.9)
Immature Grans (Abs): 0 10*3/uL (ref 0.0–0.1)
Immature Granulocytes: 0 %
LYMPHS ABS: 2.7 10*3/uL (ref 0.7–3.1)
Lymphs: 31 %
MCH: 27.2 pg (ref 26.6–33.0)
MCHC: 34 g/dL (ref 31.5–35.7)
MCV: 80 fL (ref 79–97)
MONOCYTES: 9 %
MONOS ABS: 0.8 10*3/uL (ref 0.1–0.9)
Neutrophils Absolute: 5.2 10*3/uL (ref 1.4–7.0)
Neutrophils: 59 %
Platelets: 353 10*3/uL (ref 150–379)
RBC: 5.04 x10E6/uL (ref 3.77–5.28)
RDW: 15.9 % — AB (ref 12.3–15.4)
WBC: 8.7 10*3/uL (ref 3.4–10.8)

## 2016-03-09 LAB — LIPID PANEL
Chol/HDL Ratio: 3.3 ratio units (ref 0.0–4.4)
Cholesterol, Total: 212 mg/dL — ABNORMAL HIGH (ref 100–199)
HDL: 64 mg/dL (ref >39)
LDL Calculated: 121 mg/dL — ABNORMAL HIGH (ref 0–99)
TRIGLYCERIDES: 134 mg/dL (ref 0–149)
VLDL Cholesterol Cal: 27 mg/dL (ref 5–40)

## 2016-03-09 LAB — TSH: TSH: 0.898 u[IU]/mL (ref 0.450–4.500)

## 2016-03-09 LAB — ANA W/REFLEX IF POSITIVE: ANA: NEGATIVE

## 2016-03-14 ENCOUNTER — Other Ambulatory Visit: Payer: Self-pay | Admitting: Family Medicine

## 2016-03-14 NOTE — Telephone Encounter (Signed)
Last Cr and K+ reviewed; Rx approved for 7 months

## 2016-03-15 ENCOUNTER — Other Ambulatory Visit: Payer: Self-pay | Admitting: Family Medicine

## 2016-03-15 NOTE — Progress Notes (Signed)
Orders for high Ca2+ workup

## 2016-03-23 ENCOUNTER — Telehealth: Payer: Self-pay | Admitting: Family Medicine

## 2016-03-23 MED ORDER — OSELTAMIVIR PHOSPHATE 75 MG PO CAPS: 75.0000 mg | ORAL_CAPSULE | Freq: Every day | ORAL | 0 refills | Status: AC

## 2016-03-23 NOTE — Telephone Encounter (Signed)
We're sorry to hear that her grandson is sick Please have her start the preventive dose of Tamiflu, one a day for ten days Rx sent Encourage extra vitamin C too

## 2016-03-23 NOTE — Telephone Encounter (Signed)
Patient notified and already takes vitamin d

## 2016-03-23 NOTE — Telephone Encounter (Signed)
Pt states she was exposed to the flu on Monday from her grandson who has tested positive. Pt does not have any symptoms right now. She did not get the flu shot this year. Pt is requesting to have Tamiflu sent to [place]. Please advise.

## 2016-03-29 LAB — BASIC METABOLIC PANEL WITH GFR
BUN: 11 mg/dL (ref 7–25)
CALCIUM: 10.2 mg/dL (ref 8.6–10.4)
CO2: 27 mmol/L (ref 20–31)
CREATININE: 0.65 mg/dL (ref 0.60–0.93)
Chloride: 100 mmol/L (ref 98–110)
GFR, EST NON AFRICAN AMERICAN: 85 mL/min (ref >=60)
Glucose, Bld: 93 mg/dL (ref 65–99)
Potassium: 4.3 mmol/L (ref 3.5–5.3)
SODIUM: 134 mmol/L — AB (ref 135–146)

## 2016-03-29 LAB — PARATHYROID HORMONE, INTACT (NO CA): PTH: 87 pg/mL — AB (ref 14–64)

## 2016-03-29 LAB — VITAMIN D 25 HYDROXY (VIT D DEFICIENCY, FRACTURES): VIT D 25 HYDROXY: 28 ng/mL — AB (ref 30–100)

## 2016-03-29 LAB — PHOSPHORUS: PHOSPHORUS: 3 mg/dL (ref 2.1–4.3)

## 2016-03-31 ENCOUNTER — Telehealth: Payer: Self-pay | Admitting: Family Medicine

## 2016-03-31 DIAGNOSIS — R7989 Other specified abnormal findings of blood chemistry: Secondary | ICD-10-CM

## 2016-03-31 DIAGNOSIS — E559 Vitamin D deficiency, unspecified: Secondary | ICD-10-CM

## 2016-03-31 DIAGNOSIS — E349 Endocrine disorder, unspecified: Secondary | ICD-10-CM | POA: Insufficient documentation

## 2016-03-31 MED ORDER — VITAMIN D (ERGOCALCIFEROL) 1.25 MG (50000 UNIT) PO CAPS: 50000.0000 [IU] | ORAL_CAPSULE | ORAL | 1 refills | Status: AC

## 2016-03-31 NOTE — Telephone Encounter (Signed)
I spoke with patient about elev PTH Not sure if secondary Low vitamin D, Ca2+ normal Refer to endo, Wed not 1st thing in the morning We also talked about tramadol; only use when needed; risk of addiction, call me mind telling her to take when not in pain Should not affect Ca2+ or vit D or PTH

## 2016-03-31 NOTE — Assessment & Plan Note (Signed)
Refer to endo 

## 2016-03-31 NOTE — Assessment & Plan Note (Signed)
Start Rx twice a month for 2 months

## 2016-04-27 ENCOUNTER — Other Ambulatory Visit: Payer: Self-pay | Admitting: Family Medicine

## 2016-04-27 DIAGNOSIS — Z1231 Encounter for screening mammogram for malignant neoplasm of breast: Secondary | ICD-10-CM

## 2016-05-03 ENCOUNTER — Encounter: Payer: Self-pay | Admitting: Obstetrics and Gynecology

## 2016-05-03 ENCOUNTER — Ambulatory Visit (INDEPENDENT_AMBULATORY_CARE_PROVIDER_SITE_OTHER): Payer: Medicare Other | Admitting: Obstetrics and Gynecology

## 2016-05-03 VITALS — BP 136/80 | HR 82 | Ht 65.0 in | Wt 141.6 lb

## 2016-05-03 DIAGNOSIS — N812 Incomplete uterovaginal prolapse: Secondary | ICD-10-CM

## 2016-05-03 DIAGNOSIS — N8111 Cystocele, midline: Secondary | ICD-10-CM

## 2016-05-03 NOTE — Progress Notes (Signed)
Chief complaint: 1. Pessary maintenance 2. Cystocele; uterovaginal prolapse, incomplete 2. Vaginal atrophy 4. History of Vaginal erosion  3 month pessary follow-up ( last visit 02/03/2016) She is not reporting any vaginal bleeding or discharge. She has noted worsening prolapse of the vagina through the introitus with prolonged standing and activities; it does recede when she lies down. She desires to have the pessary reinserted She is now 6 months status post total knee replacement-doing well.  Past medical history, medications, and allergies are reviewed  OBJECTIVE: BP 136/80   Pulse 82   Ht 5\' 5"  (1.651 m)   Wt 141 lb 9.6 oz (64.2 kg)   BMI 23.56 kg/m  Pleasant elderly female in no acute distress. She is alert and oriented. Affect is appropriate. PELVIC: External Genitalia: Normal BUS: Normal Vagina: Atrophic changes; no epithelial ulceration;  cystocele, second-degree to third-degree Cervix: No lesions Uterus: Retroverted, mobile, nontender, normal size and shape Adnexa: Nonpalpable and nontender RV: Normal external exam Bladder: Nontender PROCEDURE: #5 ring with support pessary is inserted today  ASSESSMENT: 1. Cystocele; uterovaginal prolapse, incomplete 2. Patient desires restart of pessary  PLAN: 1. Reinsert #5 ring with support diaphragm pessary 2. Continue with Trimosan gel intravaginal weekly 3. Return in 3 months for follow-up  A total of 15 minutes were spent face-to-face with the patient during this encounter and over half of that time dealt with counseling and coordination of care.  Brayton Mars, MD  Note: This dictation was prepared with Dragon dictation along with smaller phrase technology. Any transcriptional errors that result from this process are unintentional.

## 2016-05-03 NOTE — Patient Instructions (Signed)
1. #5 ring with support pessary is inserted today 2. Use Trimosan gel intravaginal weekly 3. Return in 3 months for follow-up

## 2016-05-18 LAB — HM MAMMOGRAPHY: HM MAMMO: NORMAL (ref 0–4)

## 2016-05-24 ENCOUNTER — Ambulatory Visit
Admission: RE | Admit: 2016-05-24 | Discharge: 2016-05-24 | Disposition: A | Discharge status: Home / Self Care | Payer: Medicare Other | Source: Ambulatory Visit | Attending: Family Medicine | Admitting: Family Medicine

## 2016-05-24 DIAGNOSIS — Z1231 Encounter for screening mammogram for malignant neoplasm of breast: Secondary | ICD-10-CM | POA: Insufficient documentation

## 2016-06-14 DIAGNOSIS — E210 Primary hyperparathyroidism: Secondary | ICD-10-CM | POA: Insufficient documentation

## 2016-07-16 IMAGING — MG MM DIGITAL SCREENING BILAT W/ CAD
5 series · 5 of 5 positions shown · non-contrast
Comparison: Previous exam(s).

CLINICAL DATA: Screening.

EXAM:
DIGITAL SCREENING BILATERAL MAMMOGRAM WITH CAD

[L CC]
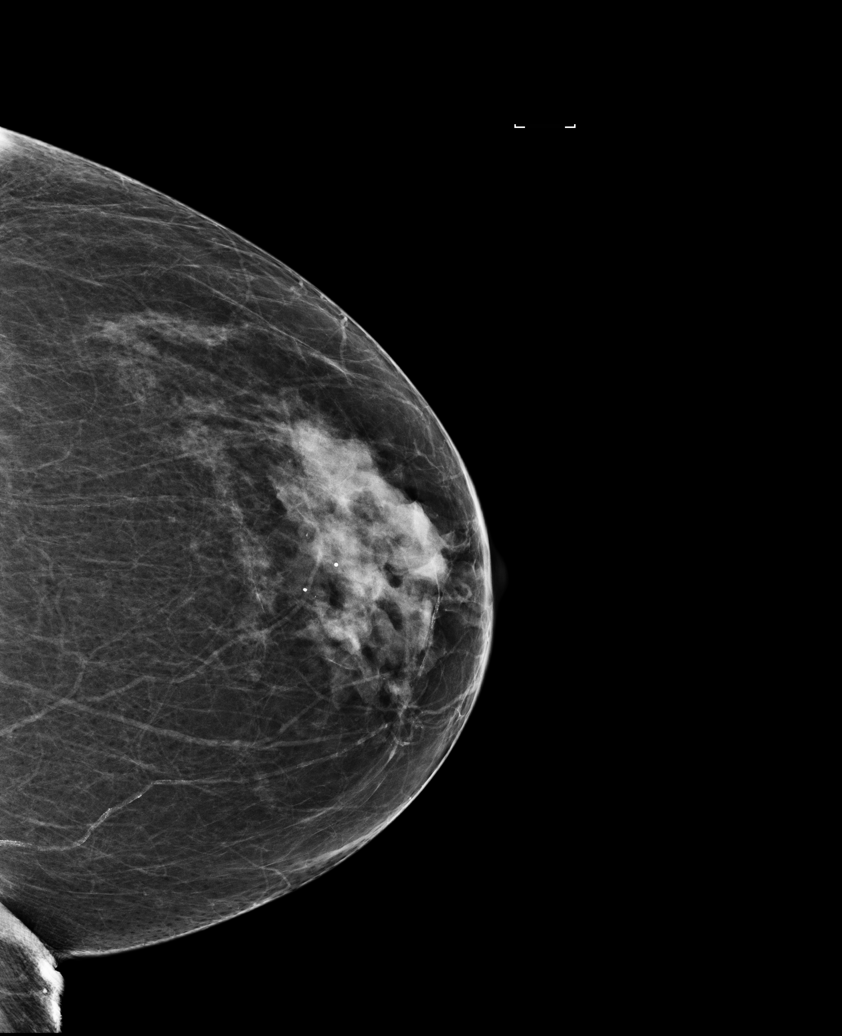

[L MLO (1 of 2)]
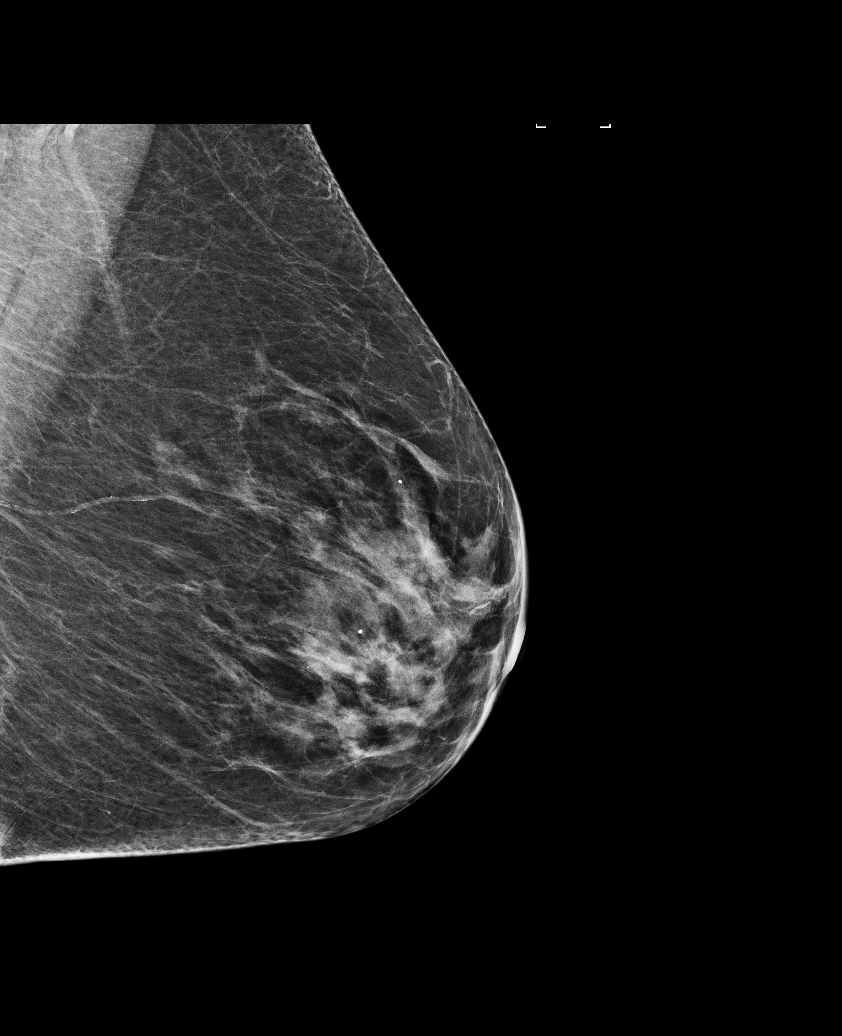

[R MLO]
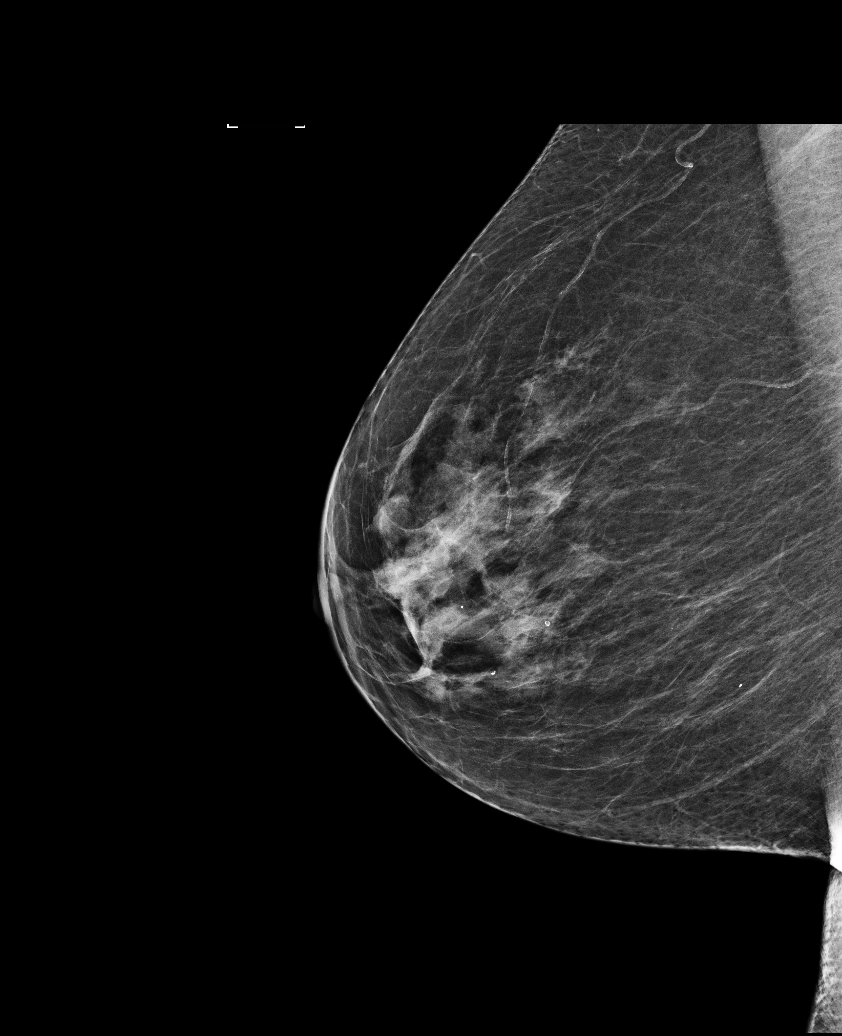

[R CC]
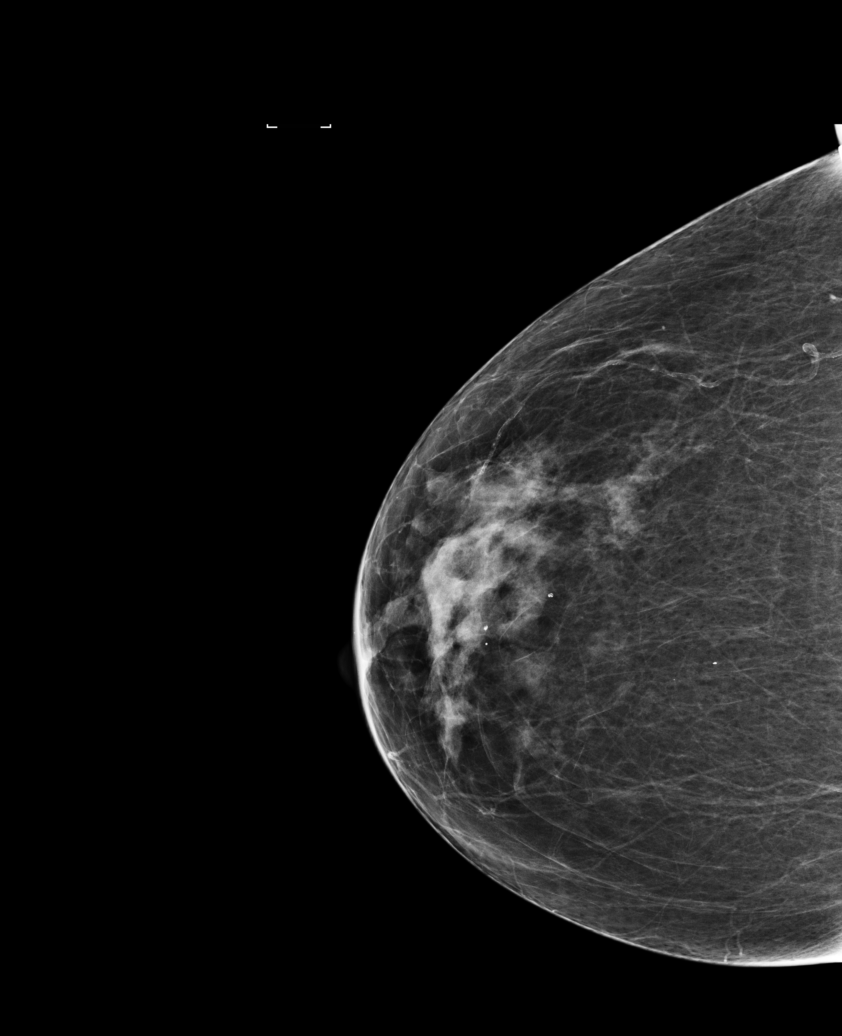

[L MLO (2 of 2)]
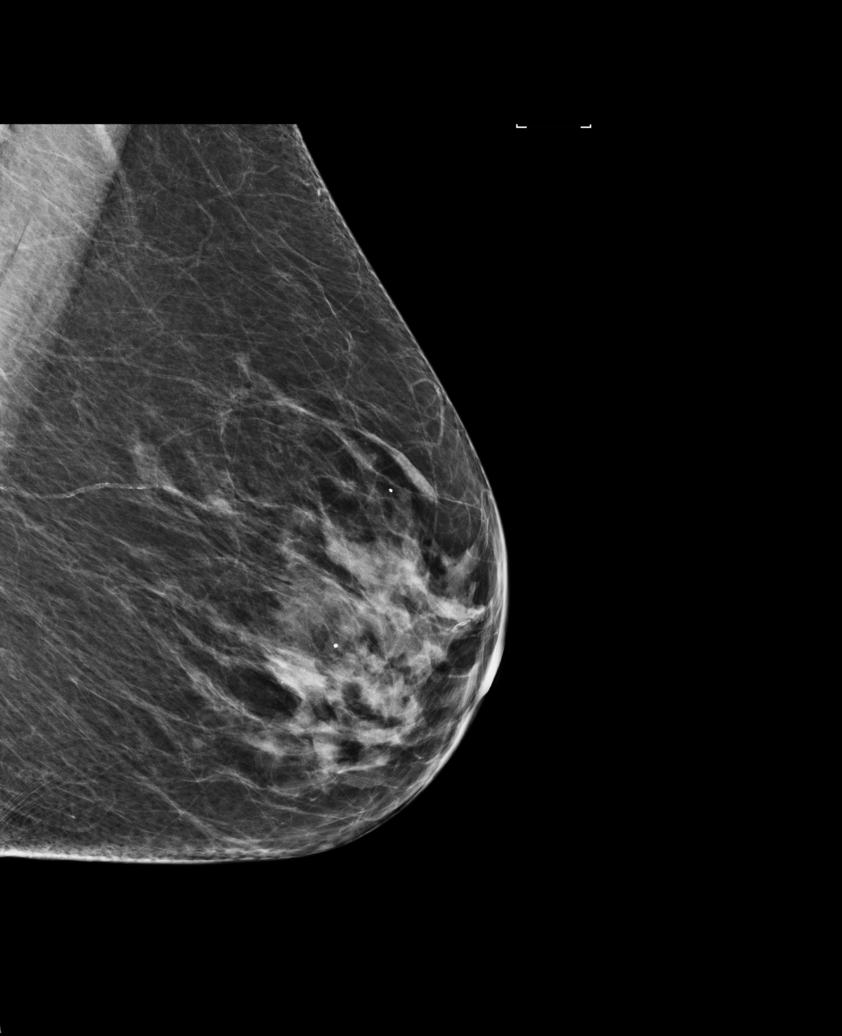

[5 of 5 positions shown; findings below may reference images not displayed]

ACR Breast Density Category c: The breast tissue is heterogeneously
dense, which may obscure small masses.
FINDINGS: There are no findings suspicious for malignancy. Images were
processed with CAD.
IMPRESSION: No mammographic evidence of malignancy. A result letter of this
screening mammogram will be mailed directly to the patient.

RECOMMENDATION:
Screening mammogram in one year. (Code:YJ-[id number])

BI-RADS CATEGORY  1: Negative.

## 2016-08-09 ENCOUNTER — Encounter: Payer: Medicare Other | Admitting: Obstetrics and Gynecology

## 2016-08-29 ENCOUNTER — Other Ambulatory Visit: Payer: Self-pay | Admitting: Family Medicine

## 2016-08-29 NOTE — Telephone Encounter (Signed)
Reviewed last thyroid, last visit rx approved Lab Results  Component Value Date   TSH 0.898 03/08/2016

## 2016-08-30 ENCOUNTER — Encounter: Payer: Self-pay | Admitting: Obstetrics and Gynecology

## 2016-08-30 ENCOUNTER — Ambulatory Visit (INDEPENDENT_AMBULATORY_CARE_PROVIDER_SITE_OTHER): Payer: Medicare Other | Admitting: Obstetrics and Gynecology

## 2016-08-30 VITALS — BP 148/78 | HR 69 | Ht 65.0 in | Wt 143.7 lb

## 2016-08-30 DIAGNOSIS — N898 Other specified noninflammatory disorders of vagina: Secondary | ICD-10-CM | POA: Diagnosis not present

## 2016-08-30 DIAGNOSIS — Z4689 Encounter for fitting and adjustment of other specified devices: Secondary | ICD-10-CM

## 2016-08-30 DIAGNOSIS — N812 Incomplete uterovaginal prolapse: Secondary | ICD-10-CM | POA: Diagnosis not present

## 2016-08-30 DIAGNOSIS — N952 Postmenopausal atrophic vaginitis: Secondary | ICD-10-CM

## 2016-08-30 DIAGNOSIS — R300 Dysuria: Secondary | ICD-10-CM | POA: Diagnosis not present

## 2016-08-30 DIAGNOSIS — N8111 Cystocele, midline: Secondary | ICD-10-CM

## 2016-08-30 LAB — POCT URINALYSIS DIPSTICK
BILIRUBIN UA: NEGATIVE
GLUCOSE UA: NEGATIVE
KETONES UA: NEGATIVE
Nitrite, UA: NEGATIVE
Protein, UA: NEGATIVE
UROBILINOGEN UA: 0.2 U/dL
pH, UA: 7.5 (ref 5.0–8.0)

## 2016-08-30 NOTE — Progress Notes (Signed)
Chief complaint: 1. Pessary maintenance 2. Cystocele; uterovaginal prolapse, incomplete 2. Vaginal atrophy 4. History of Vaginal erosion  3 month pessary follow-up ( 05/03/2016) She is not reporting any vaginal bleeding; Mild discharge.  She is now 9 months status post total knee replacement-doing well.  Past medical history, medications, and allergies are reviewed  OBJECTIVE: BP 136/80   Pulse 82   Ht 5\' 5"  (1.651 m)   Wt 141 lb 9.6 oz (64.2 kg)   BMI 23.56 kg/m  Pleasant elderly female in no acute distress. She is alert and oriented. Affect is appropriate. PELVIC: External Genitalia: Normal BUS: Normal Vagina: Atrophic changes; no epithelial ulceration; cystocele, second-degree to third-degree Cervix: No lesions Uterus: Retroverted, mobile, nontender, normal size and shape Adnexa: Nonpalpable and nontender RV: Normal external exam Bladder: Nontender PROCEDURE: #5 ring with support pessary is Removed, cleaned, and reinserted today  ASSESSMENT: 1. Cystocele; uterovaginal prolapse, incomplete 2. Normal pessary maintenance.  #5 ring with support  PLAN: 1. Removed, cleaned, and reinserted #5 ring with support diaphragm pessary 2. Continue with Trimosan gel intravaginal weekly 3. Return in 3 months for follow-up  A total of 15 minutes were spent face-to-face with the patient during this encounter and over half of that time dealt with counseling and coordination of care.    Brayton Mars, MD  Note: This dictation was prepared with Dragon dictation along with smaller phrase technology. Any transcriptional errors that result from this process are unintentional.

## 2016-08-30 NOTE — Patient Instructions (Signed)
1.  Return in 3 months for pessary maintenance. 2.  Continue inserting TRIMO San gel ntravaginal weekly

## 2016-09-02 LAB — NUSWAB BV AND CANDIDA, NAA
CANDIDA ALBICANS, NAA: NEGATIVE
Candida glabrata, NAA: NEGATIVE

## 2016-09-03 LAB — URINE CULTURE

## 2016-09-05 ENCOUNTER — Telehealth: Payer: Self-pay

## 2016-09-05 MED ORDER — NITROFURANTOIN MONOHYD MACRO 100 MG PO CAPS: 100.0000 mg | ORAL_CAPSULE | Freq: Two times a day (BID) | ORAL | 0 refills | Status: DC

## 2016-09-05 NOTE — Telephone Encounter (Signed)
-----   Message from Brayton Mars, MD sent at 09/05/2016  8:30 AM EDT ----- Please notify - Abnormal Labs Please: Macrobid twice a day for 7 days

## 2016-09-05 NOTE — Telephone Encounter (Signed)
Pt aware. Med erx. 

## 2016-10-12 ENCOUNTER — Other Ambulatory Visit: Payer: Self-pay | Admitting: Family Medicine

## 2016-10-12 NOTE — Telephone Encounter (Signed)
Pt states she has an appt in November and plans to come.

## 2016-10-12 NOTE — Telephone Encounter (Signed)
Please ask patient to schedule a follow-up visit with me in the next 4 weeks; I've sent refill of losartan in as requested

## 2016-10-18 ENCOUNTER — Telehealth: Payer: Self-pay | Admitting: Family Medicine

## 2016-10-24 ENCOUNTER — Telehealth: Payer: Self-pay | Admitting: Family Medicine

## 2016-11-08 ENCOUNTER — Telehealth: Payer: Self-pay | Admitting: Family Medicine

## 2016-11-08 MED ORDER — LOSARTAN POTASSIUM 50 MG PO TABS: 50.0000 mg | ORAL_TABLET | Freq: Every day | ORAL | 0 refills | Status: DC

## 2016-11-08 NOTE — Telephone Encounter (Signed)
Spoke with patient and she made appt for 11/29/16.

## 2016-11-08 NOTE — Telephone Encounter (Signed)
Please ask pt to schedule an appt with me in the next 30 days; we'll get labs then too please I'll send in one more refill of the BP medicine Thank you (by the way, there are two more open phone notes in the chart started by call center; please get those issues resolved too if you can, thank you)

## 2016-11-09 ENCOUNTER — Other Ambulatory Visit: Payer: Self-pay

## 2016-11-09 ENCOUNTER — Ambulatory Visit (INDEPENDENT_AMBULATORY_CARE_PROVIDER_SITE_OTHER): Payer: Medicare Other

## 2016-11-09 DIAGNOSIS — Z23 Encounter for immunization: Secondary | ICD-10-CM | POA: Diagnosis not present

## 2016-11-09 MED ORDER — LOSARTAN POTASSIUM 50 MG PO TABS: 50.0000 mg | ORAL_TABLET | Freq: Every day | ORAL | 0 refills | Status: DC

## 2016-11-09 NOTE — Telephone Encounter (Signed)
I already approved the Rx for the losartan on 11/08/16 I'm just going to send in another Rx and be done with it

## 2016-11-09 NOTE — Telephone Encounter (Signed)
Pt came in today for PNA vaccine, states that she only has 2 days of losartan left. She has scheduled an appt with you for f/u however that appt is not until 11/29/16. Please advise. Thanks!

## 2016-11-29 ENCOUNTER — Encounter: Payer: Self-pay | Admitting: Family Medicine

## 2016-11-29 ENCOUNTER — Ambulatory Visit: Payer: Medicare Other | Admitting: Family Medicine

## 2016-11-29 DIAGNOSIS — Z5181 Encounter for therapeutic drug level monitoring: Secondary | ICD-10-CM

## 2016-11-29 DIAGNOSIS — E890 Postprocedural hypothyroidism: Secondary | ICD-10-CM

## 2016-11-29 DIAGNOSIS — E349 Endocrine disorder, unspecified: Secondary | ICD-10-CM | POA: Diagnosis not present

## 2016-11-29 DIAGNOSIS — M8580 Other specified disorders of bone density and structure, unspecified site: Secondary | ICD-10-CM

## 2016-11-29 DIAGNOSIS — E559 Vitamin D deficiency, unspecified: Secondary | ICD-10-CM

## 2016-11-29 DIAGNOSIS — I10 Essential (primary) hypertension: Secondary | ICD-10-CM | POA: Diagnosis not present

## 2016-11-29 DIAGNOSIS — E782 Mixed hyperlipidemia: Secondary | ICD-10-CM | POA: Diagnosis not present

## 2016-11-29 LAB — COMPLETE METABOLIC PANEL WITH GFR
AG RATIO: 1.5 (calc) (ref 1.0–2.5)
ALBUMIN MSPROF: 4.4 g/dL (ref 3.6–5.1)
ALKALINE PHOSPHATASE (APISO): 103 U/L (ref 33–130)
ALT: 14 U/L (ref 6–29)
AST: 17 U/L (ref 10–35)
BUN: 13 mg/dL (ref 7–25)
CO2: 28 mmol/L (ref 20–32)
CREATININE: 0.66 mg/dL (ref 0.60–0.93)
Calcium: 10.9 mg/dL — ABNORMAL HIGH (ref 8.6–10.4)
Chloride: 100 mmol/L (ref 98–110)
GFR, Est African American: 97 mL/min/{1.73_m2} (ref >=60)
GFR, Est Non African American: 84 mL/min/{1.73_m2} (ref >=60)
GLOBULIN: 2.9 g/dL (ref 1.9–3.7)
Glucose, Bld: 76 mg/dL (ref 65–99)
POTASSIUM: 4.7 mmol/L (ref 3.5–5.3)
SODIUM: 136 mmol/L (ref 135–146)
Total Bilirubin: 0.6 mg/dL (ref 0.2–1.2)
Total Protein: 7.3 g/dL (ref 6.1–8.1)

## 2016-11-29 LAB — LIPID PANEL
CHOL/HDL RATIO: 3.3 (calc) (ref <5.0)
CHOLESTEROL: 222 mg/dL — AB (ref <200)
HDL: 67 mg/dL (ref >50)
LDL Cholesterol (Calc): 131 mg/dL (calc) — ABNORMAL HIGH
NON-HDL CHOLESTEROL (CALC): 155 mg/dL — AB (ref <130)
Triglycerides: 128 mg/dL (ref <150)

## 2016-11-29 LAB — TSH: TSH: 1.74 mIU/L (ref 0.40–4.50)

## 2016-11-29 NOTE — Assessment & Plan Note (Signed)
Patient would like to hold on DEXA scan right now; continue bisphosphonate; continue vitamin D; fall precautions

## 2016-11-29 NOTE — Assessment & Plan Note (Signed)
Seeing endocrinologist

## 2016-11-29 NOTE — Patient Instructions (Addendum)
We'll get labs today If you have not heard anything from my staff in a week about any orders/referrals/studies from today, please contact us here to follow-up [phone]   Fall Prevention in the [place] can cause injuries. They can happen to people of all ages. There are many things you can do to make your home safe and to help prevent falls. What can I do on the outside of my home?  Regularly fix the edges of walkways and driveways and fix any cracks.  Remove anything that might make you trip as you walk through a door, such as a raised step or threshold.  Trim any bushes or trees on the path to your home.  Use bright outdoor lighting.  Clear any walking paths of anything that might make someone trip, such as rocks or tools.  Regularly check to see if handrails are loose or broken. Make sure that both sides of any steps have handrails.  Any raised decks and porches should have guardrails on the edges.  Have any leaves, snow, or ice cleared regularly.  Use sand or salt on walking paths during winter.  Clean up any spills in your garage right away. This includes oil or grease spills. What can I do in the bathroom?  Use night lights.  Install grab bars by the toilet and in the tub and shower. Do not use towel bars as grab bars.  Use non-skid mats or decals in the tub or shower.  If you need to sit down in the shower, use a plastic, non-slip stool.  Keep the floor dry. Clean up any water that spills on the floor as soon as it happens.  Remove soap buildup in the tub or shower regularly.  Attach bath mats securely with double-sided non-slip rug tape.  Do not have throw rugs and other things on the floor that can make you trip. What can I do in the bedroom?  Use night lights.  Make sure that you have a light by your bed that is easy to reach.  Do not use any sheets or blankets that are too big for your bed. They should not hang down onto the floor.  Have a firm  chair that has side arms. You can use this for support while you get dressed.  Do not have throw rugs and other things on the floor that can make you trip. What can I do in the kitchen?  Clean up any spills right away.  Avoid walking on wet floors.  Keep items that you use a lot in easy-to-reach places.  If you need to reach something above you, use a strong step stool that has a grab bar.  Keep electrical cords out of the way.  Do not use floor polish or wax that makes floors slippery. If you must use wax, use non-skid floor wax.  Do not have throw rugs and other things on the floor that can make you trip. What can I do with my stairs?  Do not leave any items on the stairs.  Make sure that there are handrails on both sides of the stairs and use them. Fix handrails that are broken or loose. Make sure that handrails are as long as the stairways.  Check any carpeting to make sure that it is firmly attached to the stairs. Fix any carpet that is loose or worn.  Avoid having throw rugs at the top or bottom of the stairs. If you do have throw rugs, attach  them to the floor with carpet tape.  Make sure that you have a light switch at the top of the stairs and the bottom of the stairs. If you do not have them, ask someone to add them for you. What else can I do to help prevent falls?  Wear shoes that: ? Do not have high heels. ? Have rubber bottoms. ? Are comfortable and fit you well. ? Are closed at the toe. Do not wear sandals.  If you use a stepladder: ? Make sure that it is fully opened. Do not climb a closed stepladder. ? Make sure that both sides of the stepladder are locked into place. ? Ask someone to hold it for you, if possible.  Clearly mark and make sure that you can see: ? Any grab bars or handrails. ? First and last steps. ? Where the edge of each step is.  Use tools that help you move around (mobility aids) if they are needed. These  include: ? Canes. ? Walkers. ? Scooters. ? Crutches.  Turn on the lights when you go into a dark area. Replace any light bulbs as soon as they burn out.  Set up your furniture so you have a clear path. Avoid moving your furniture around.  If any of your floors are uneven, fix them.  If there are any pets around you, be aware of where they are.  Review your medicines with your doctor. Some medicines can make you feel dizzy. This can increase your chance of falling. Ask your doctor what other things that you can do to help prevent falls. This information is not intended to replace advice given to you by your health care provider. Make sure you discuss any questions you have with your health care provider. Document Released: 10/15/2008 Document Revised: 05/27/2015 Document Reviewed: 01/23/2014 Elsevier Interactive Patient Education  [employer].

## 2016-11-29 NOTE — Assessment & Plan Note (Signed)
Check fasting lipids today 

## 2016-11-29 NOTE — Progress Notes (Signed)
BP 132/84 (BP Location: Right Arm, Patient Position: Sitting, Cuff Size: Normal)   Pulse 98   Temp 98.3 F (36.8 C) (Oral)   Ht 5\' 5"  (1.651 m)   Wt 141 lb 4.8 oz (64.1 kg)   SpO2 95%   BMI 23.51 kg/m    Subjective:    Patient ID: Haley Schneider, female    DOB: February 15, 1935, 81 y.o.   MRN: [id number]  HPI: Haley Schneider is a 81 y.o. female  Chief Complaint  Patient presents with  . Medication Refill    HPI  She has osteoporosis; she is taking bisphosphonate; practicing fall precautions Patient is not interested in getting a DEXA scan; she has had a lot of xrays this year; would like to wait She is seeing endocrinologist; full CMP done in July, all normal Vitamin D deficiency, level was 24 in July; she started her on vitamin D supplementation Doing lab work-up for elevated PTH with endo  Hypertension; on ARB  High cholesterol; did not tolerate statin; hurt in the bones; really watching diet now, "let's not say that" right after Thanksgiving, "and you know Christmas is coming up when I eat ham biscuits"  Hypothyroidism; I asked about energy level; she can't finish some chores now; bowels are normal; weight is stable, actually lost a couple of pounds Lab Results  Component Value Date   TSH 0.898 03/08/2016     Depression screen [facility] 2/9 11/29/2016 03/08/2016 09/01/2015 03/10/2015 08/12/2014  Decreased Interest 0 0 0 0 0  Down, Depressed, Hopeless 0 0 0 0 0  PHQ - 2 Score 0 0 0 0 0    Relevant past medical, surgical, family and social history reviewed Past Medical History:  Diagnosis Date  . Arthritis   . Female bladder prolapse   . Fibroid, uterine   . GERD (gastroesophageal reflux disease)   . History of abnormal cervical Pap smear   . History of frequent urinary tract infections   . History of urinary tract infection   . Hypercholesteremia   . Hypertension   . Hypothyroid   . Menopause   . Nocturia   . OP (osteoporosis)   . Pessary maintenance    # 5 ring  with support  . PONV (postoperative nausea and vomiting)    many yrs ago  . Urethral caruncle   . Urinary incontinence   . Urinary urgency   . Vaginal atrophy   . Vaginal pessary present    remains in place.  . Vaginal vault prolapse    Past Surgical History:  Procedure Laterality Date  . CATARACT EXTRACTION    . EYE SURGERY     cataract surgery bilat   . EYE SURGERY     laser eye surgery left eye"retinal acclution"laser  . HERNIA REPAIR    . THYROGLOSSAL DUCT CYST    . THYROIDECTOMY    . TONSILLECTOMY    . TONSILLECTOMY    . TOTAL HIP ARTHROPLASTY     left hip 7 years ago   . TOTAL HIP ARTHROPLASTY Right 08/26/2014   Procedure: RIGHT TOTAL HIP ARTHROPLASTY ANTERIOR APPROACH;  Surgeon: Gaynelle Arabian, MD;  Location: [facility];  Service: Orthopedics;  Laterality: Right;  . TOTAL KNEE ARTHROPLASTY Left 10/25/2015   Procedure: LEFT TOTAL KNEE ARTHROPLASTY;  Surgeon: Gaynelle Arabian, MD;  Location: [facility];  Service: Orthopedics;  Laterality: Left;   Family History  Problem Relation Age of Onset  . Alzheimer's disease Mother   . Cancer Father  lung  . Hypertension Father   . Stroke Paternal Grandfather   . Tuberculosis Paternal Grandmother   . Diabetes Son   . Breast cancer Paternal Aunt   . Hypertension Sister   . Hypertension Sister   . Heart disease Neg Hx    Social History   Tobacco Use  . Smoking status: Never Smoker  . Smokeless tobacco: Never Used  Substance Use Topics  . Alcohol use: No  . Drug use: No    Interim medical history since last visit reviewed. Allergies and medications reviewed  Review of Systems Per HPI unless specifically indicated above     Objective:    BP 132/84 (BP Location: Right Arm, Patient Position: Sitting, Cuff Size: Normal)   Pulse 98   Temp 98.3 F (36.8 C) (Oral)   Ht 5\' 5"  (1.651 m)   Wt 141 lb 4.8 oz (64.1 kg)   SpO2 95%   BMI 23.51 kg/m   Wt Readings from Last 3 Encounters:  11/29/16 141 lb 4.8 oz (64.1 kg)    08/30/16 143 lb 11.2 oz (65.2 kg)  05/03/16 141 lb 9.6 oz (64.2 kg)    Physical Exam  Constitutional: She appears well-developed and well-nourished. No distress.  HENT:  Head: Normocephalic and atraumatic.  Eyes: EOM are normal. No scleral icterus.  Neck: No thyromegaly present.  Cardiovascular: Normal rate, regular rhythm and normal heart sounds.  No murmur heard. Pulmonary/Chest: Effort normal and breath sounds normal. No respiratory distress. She has no wheezes.  Abdominal: Soft. Bowel sounds are normal. She exhibits no distension.  Musculoskeletal: Normal range of motion. She exhibits no edema.  Neurological: She is alert. She displays no tremor. She exhibits normal muscle tone.  Reflex Scores:      Patellar reflexes are 2+ on the right side and 2+ on the left side. Skin: Skin is warm and dry. She is not diaphoretic. No pallor.  Psychiatric: She has a normal mood and affect. Her behavior is normal. Judgment and thought content normal. Her mood appears not anxious. She does not exhibit a depressed mood.    Results for orders placed or performed in visit on 08/30/16  Urine Culture  Result Value Ref Range   Urine Culture, Routine Final report (A)    Organism ID, Bacteria Enterococcus faecalis (A)    Antimicrobial Susceptibility Comment   POCT urinalysis dipstick  Result Value Ref Range   Color, UA yellow    Clarity, UA clear    Glucose, UA neg    Bilirubin, UA neg    Ketones, UA neg    Spec Grav, UA <=1.005 (A) 1.010 - 1.025   Blood, UA hem trace    pH, UA 7.5 5.0 - 8.0   Protein, UA neg    Urobilinogen, UA 0.2 0.2 or 1.0 E.U./dL   Nitrite, UA neg    Leukocytes, UA Moderate (2+) (A) Negative  NuSwab BV and Candida, NAA  Result Value Ref Range   Atopobium vaginae Low - 0 Score   BVAB 2 Low - 0 Score   Megasphaera 1 Low - 0 Score   Candida albicans, NAA Negative Negative   Candida glabrata, NAA Negative Negative      Assessment & Plan:   Problem List Items  Addressed This Visit      Cardiovascular and Mediastinum   Hypertension    Avoid extra salt; avoid decongestants; continue ARB; check creatinine and K+      Relevant Medications   simvastatin (ZOCOR) 10 MG tablet  Endocrine   Hypothyroidism    Check TSH today      Relevant Orders   TSH     Musculoskeletal and Integument   Osteopenia    Patient would like to hold on DEXA scan right now; continue bisphosphonate; continue vitamin D; fall precautions        Other   Vitamin D deficiency    Endocrinologist is following this      Medication monitoring encounter    Check liver and kidney function      Relevant Orders   COMPLETE METABOLIC PANEL WITH GFR   Hyperlipidemia    Check fasting lipids today      Relevant Medications   simvastatin (ZOCOR) 10 MG tablet   Other Relevant Orders   Lipid panel   Elevated parathyroid hormone    Seeing endocrinologist          Follow up plan: Return in about 6 months (around 05/29/2017) for twenty minute follow-up with fasting labs; Medicare Wellness check when due.  An after-visit summary was printed and given to the patient at [place].  Please see the patient instructions which may contain other information and recommendations beyond what is mentioned above in the assessment and plan.  No orders of the defined types were placed in this encounter.   Orders Placed This Encounter  Procedures  . COMPLETE METABOLIC PANEL WITH GFR  . Lipid panel  . TSH

## 2016-11-29 NOTE — Assessment & Plan Note (Signed)
Check TSH today

## 2016-11-29 NOTE — Assessment & Plan Note (Signed)
Check liver and kidney function 

## 2016-11-29 NOTE — Assessment & Plan Note (Signed)
Endocrinologist is following this

## 2016-11-29 NOTE — Assessment & Plan Note (Signed)
Avoid extra salt; avoid decongestants; continue ARB; check creatinine and K+

## 2016-11-30 ENCOUNTER — Ambulatory Visit: Payer: Medicare Other | Admitting: Obstetrics and Gynecology

## 2016-11-30 ENCOUNTER — Encounter: Payer: Self-pay | Admitting: Obstetrics and Gynecology

## 2016-11-30 VITALS — BP 150/77 | HR 75 | Ht 65.0 in | Wt 143.8 lb

## 2016-11-30 DIAGNOSIS — N812 Incomplete uterovaginal prolapse: Secondary | ICD-10-CM | POA: Diagnosis not present

## 2016-11-30 DIAGNOSIS — N8111 Cystocele, midline: Secondary | ICD-10-CM

## 2016-11-30 DIAGNOSIS — N952 Postmenopausal atrophic vaginitis: Secondary | ICD-10-CM | POA: Diagnosis not present

## 2016-11-30 DIAGNOSIS — Z4689 Encounter for fitting and adjustment of other specified devices: Secondary | ICD-10-CM

## 2016-11-30 NOTE — Patient Instructions (Signed)
1.  Return in 3 months for pessary maintenance 2.  Continue using Trimosan gel intravaginally weekly

## 2016-11-30 NOTE — Progress Notes (Signed)
Chief complaint: 1. Pessary maintenance 2. Cystocele; uterovaginal prolapse, incomplete 2. Vaginal atrophy 4. History of Vaginal erosion  3 month pessary follow-up ( 08/30/2016) She is not reporting any vaginal bleeding; Mild discharge.  She is now 1 year status post total knee replacement-doing well  Past medical history, past surgical history, problem list, medications, and allergies are reviewed  OBJECTIVE: BP (!) 150/77   Pulse 75   Ht 5\' 5"  (1.651 m)   Wt 143 lb 12.8 oz (65.2 kg)   BMI 23.93 kg/m  Pleasant well-appearing female in no acute distress.  Alert and oriented.  Normal affect. Abdomen: Soft, nontender Pelvic exam: External genitalia-normal BUS-normal Vagina-atrophic changes present; no epithelial ulceration; second to third-degree cystocele is present Cervix-no lesions; no cervical motion tenderness Uterus-retroverted, mobile, nontender, normal size and shape Adnexa-nonpalpable and nontender Rectovaginal-normal external exam Bladder-nontender  PROCEDURE: #5 ring with support pessary is removed, cleaned, and reinserted  ASSESSMENT: 1.  Second to third-degree cystocele; uterovaginal prolapse, incomplete 2.  Normal pessary maintenance-#5 ring with support  PLAN: 1.  Pessary is removed, cleaned, and reinserted 2.  Continue with Trimosan gel intravaginally weekly 3.  Return in 3 months for follow-up  A total of 15 minutes were spent face-to-face with the patient during this encounter and over half of that time dealt with counseling and coordination of care.  Brayton Mars, MD  Note: This dictation was prepared with Dragon dictation along with smaller phrase technology. Any transcriptional errors that result from this process are unintentional.

## 2016-12-01 ENCOUNTER — Other Ambulatory Visit: Payer: Self-pay | Admitting: Family Medicine

## 2016-12-01 NOTE — Progress Notes (Signed)
Patient quit taking statin; wants to try on her own to lower her LDL

## 2016-12-20 NOTE — Telephone Encounter (Signed)
Duplicated

## 2017-01-04 ENCOUNTER — Other Ambulatory Visit: Payer: Self-pay | Admitting: Family Medicine

## 2017-01-04 NOTE — Telephone Encounter (Signed)
Cr and K+ normal; Rx approved

## 2017-02-13 NOTE — Telephone Encounter (Signed)
Visit resolved °

## 2017-02-20 NOTE — Progress Notes (Signed)
Closing out lab/order note open since:  4/18

## 2017-02-20 NOTE — Progress Notes (Signed)
Closing out lab/order note open since:  2017

## 2017-02-21 ENCOUNTER — Other Ambulatory Visit: Payer: Self-pay | Admitting: Family Medicine

## 2017-02-21 NOTE — Telephone Encounter (Signed)
Lab Results  Component Value Date   TSH 1.74 11/29/2016

## 2017-02-28 ENCOUNTER — Ambulatory Visit: Payer: Medicare Other | Admitting: Obstetrics and Gynecology

## 2017-02-28 ENCOUNTER — Encounter: Payer: Self-pay | Admitting: Obstetrics and Gynecology

## 2017-02-28 VITALS — BP 149/84 | HR 85 | Ht 65.0 in | Wt 140.1 lb

## 2017-02-28 DIAGNOSIS — R350 Frequency of micturition: Secondary | ICD-10-CM

## 2017-02-28 DIAGNOSIS — N812 Incomplete uterovaginal prolapse: Secondary | ICD-10-CM | POA: Diagnosis not present

## 2017-02-28 DIAGNOSIS — N898 Other specified noninflammatory disorders of vagina: Secondary | ICD-10-CM

## 2017-02-28 DIAGNOSIS — R102 Pelvic and perineal pain: Secondary | ICD-10-CM

## 2017-02-28 NOTE — Progress Notes (Signed)
Chief complaint: 1.  Pessary maintenance 2.  Cystocele; uterovaginal prolapse, incomplete 3.  Vaginal atrophy 4.  History of vaginal erosion 5.  Vaginal odor  41-month pessary follow-up (11/30/2016-last visit) Patient reports vaginal odor and mild discharge; no vaginal bleeding  Patient is status post total knee replacement times 15 months. Patient may be needing hyperparathyroid surgery in the near future.  She is being followed by endocrinology.  Past medical history, past surgical history, problem list, medications, and allergies are reviewed  OBJECTIVE: BP (!) 149/84   Pulse 85   Ht 5\' 5"  (1.651 m)   Wt 140 lb 1.6 oz (63.5 kg)   BMI 23.31 kg/m  Pleasant elderly female in no acute distress.  Alert and oriented. Abdomen: Soft, nontender Pelvic exam: External genitalia-normal; introitus slightly narrowed BUS-normal Vagina-moderate atrophy; some minimal vaginal spotting from apex of vagina; minimal odorous secretions noted. Cervix-no lesions; no cervical motion tenderness Bimanual-not done Rectovaginal-normal external exam Bladder-nontender  PROCEDURE: #5 ring pessary with support is removed, cleaned, and not reinserted  ASSESSMENT: 1.  Second to third-degree cystocele; uterovaginal prolapse, incomplete, managed with pessary 2.  Vaginal malodor consistent with possible early vaginal erosion and vaginitis  PLAN: 1.  Pessary is removed, cleaned, not reinserted 2.  Hold off using Trimosan gel at this time 3.  Insert Premarin cream 1/2 g intravaginal every 3 days x4 doses 4.  Return in 2 weeks for reexam and probable pessary reinsertion  A total of 15 minutes were spent face-to-face with the patient during this encounter and over half of that time dealt with counseling and coordination of care.  Brayton Mars, MD  Note: This dictation was prepared with Dragon dictation along with smaller phrase technology. Any transcriptional errors that result from this process are  unintentional.

## 2017-02-28 NOTE — Patient Instructions (Signed)
1.  Pessary is removed today.  It is not reinserted. 2.  Stop using Trimosan gel for the next 2 weeks 3.  Begin using Premarin cream intravaginal every 3 days for 4 doses 4.  Return in 2 weeks for recheck and insertion of pessary

## 2017-03-14 ENCOUNTER — Ambulatory Visit: Payer: Medicare Other | Admitting: Obstetrics and Gynecology

## 2017-03-14 ENCOUNTER — Ambulatory Visit: Payer: Medicare Other | Admitting: Family Medicine

## 2017-03-14 ENCOUNTER — Encounter: Payer: Self-pay | Admitting: Obstetrics and Gynecology

## 2017-03-14 VITALS — BP 150/84 | HR 69 | Ht 65.0 in | Wt 140.9 lb

## 2017-03-14 DIAGNOSIS — R102 Pelvic and perineal pain: Secondary | ICD-10-CM | POA: Diagnosis not present

## 2017-03-14 LAB — POCT URINALYSIS DIPSTICK
BILIRUBIN UA: NEGATIVE
GLUCOSE UA: NEGATIVE
KETONES UA: NEGATIVE
Nitrite, UA: POSITIVE
ODOR: POSITIVE
Protein, UA: NEGATIVE
Spec Grav, UA: 1.01 (ref 1.010–1.025)
Urobilinogen, UA: 0.2 E.U./dL
pH, UA: 6.5 (ref 5.0–8.0)

## 2017-03-14 MED ORDER — NITROFURANTOIN MONOHYD MACRO 100 MG PO CAPS: 100.0000 mg | ORAL_CAPSULE | Freq: Two times a day (BID) | ORAL | 0 refills | Status: DC

## 2017-03-14 NOTE — Progress Notes (Signed)
Chief complaint: 1.  Pessary maintenance 2.  History of vaginal erosion and vaginal odor 3.  Cystocele; uterovaginal prolapse, incomplete 4.  Vaginal atrophy   Haley Schneider presents today for 2-week follow-up.  At last visit her pessary was removed and not reinserted because of vaginal odor and a possible vaginal abrasion.  She did use multiple doses of estrogen cream to help treat the condition.  At this time she is asymptomatic without any vaginal discharge, vaginal odor, or vaginal bleeding.  Urinalysis today is consistent with probable UTI and Macrobid is prescribed for her.  Past Medical History:  Diagnosis Date  . Arthritis   . Female bladder prolapse   . Fibroid, uterine   . GERD (gastroesophageal reflux disease)   . History of abnormal cervical Pap smear   . History of frequent urinary tract infections   . History of urinary tract infection   . Hypercholesteremia   . Hypertension   . Hypothyroid   . Menopause   . Nocturia   . OP (osteoporosis)   . Pessary maintenance    # 5 ring with support  . PONV (postoperative nausea and vomiting)    many yrs ago  . Urethral caruncle   . Urinary incontinence   . Urinary urgency   . Vaginal atrophy   . Vaginal pessary present    remains in place.  . Vaginal vault prolapse    Past Surgical History:  Procedure Laterality Date  . CATARACT EXTRACTION    . EYE SURGERY     cataract surgery bilat   . EYE SURGERY     laser eye surgery left eye"retinal acclution"laser  . HERNIA REPAIR    . THYROGLOSSAL DUCT CYST    . THYROIDECTOMY    . TONSILLECTOMY    . TONSILLECTOMY    . TOTAL HIP ARTHROPLASTY     left hip 7 years ago   . TOTAL HIP ARTHROPLASTY Right 08/26/2014   Procedure: RIGHT TOTAL HIP ARTHROPLASTY ANTERIOR APPROACH;  Surgeon: Gaynelle Arabian, MD;  Location: [facility];  Service: Orthopedics;  Laterality: Right;  . TOTAL KNEE ARTHROPLASTY Left 10/25/2015   Procedure: LEFT TOTAL KNEE ARTHROPLASTY;  Surgeon: Gaynelle Arabian, MD;   Location: [facility];  Service: Orthopedics;  Laterality: Left;   Review of systems-comprehensive review of systems is negative including that noted in the HPI OBJECTIVE: BP (!) 150/84   Pulse 69   Ht 5\' 5"  (1.651 m)   Wt 140 lb 14.4 oz (63.9 kg)   BMI 23.45 kg/m  Very pleasant well-appearing female in no acute distress.  Alert and oriented. Abdomen: Soft, nontender Pelvic exam: External genitalia-normal; introitus slightly narrowed BUS-normal Vagina-mild to moderate atrophy; no vaginal discharge or evidence of vaginal bleeding; no vaginal malodor Cervix-no lesions; no cervical motion tenderness Uterus-small, nontender Adnexa-nonpalpable nontender Rectovaginal-normal external exam  PROCEDURE: #5 ring pessary with support is inserted  ASSESSMENT: 1.  Second to third-degree cystocele; uterovaginal prolapse, incomplete 2.  Vaginal malodor and vaginal erosion, resolved  PLAN: 1.  Pessary is inserted 2.  Resume using Trimosan gel intravaginally weekly 3.  Return in 6 weeks for pessary maintenance.[email or web address]  Brayton Mars, MD  Note: This dictation was prepared with Dragon dictation along with smaller phrase technology. Any transcriptional errors that result from this process are unintentional.

## 2017-03-14 NOTE — Patient Instructions (Signed)
1. Return in 6 weeks for pessary maintenance 

## 2017-03-16 ENCOUNTER — Other Ambulatory Visit: Payer: Self-pay | Admitting: Obstetrics and Gynecology

## 2017-03-16 LAB — URINE CULTURE

## 2017-04-11 ENCOUNTER — Ambulatory Visit: Payer: Medicare Other | Admitting: Family Medicine

## 2017-04-25 ENCOUNTER — Encounter: Payer: Self-pay | Admitting: Obstetrics and Gynecology

## 2017-04-25 ENCOUNTER — Ambulatory Visit: Payer: Medicare Other | Admitting: Obstetrics and Gynecology

## 2017-04-25 VITALS — BP 145/81 | HR 74 | Ht 65.0 in | Wt 141.5 lb

## 2017-04-25 DIAGNOSIS — R102 Pelvic and perineal pain: Secondary | ICD-10-CM | POA: Diagnosis not present

## 2017-04-25 LAB — POCT URINALYSIS DIPSTICK
BILIRUBIN UA: NEGATIVE
GLUCOSE UA: NEGATIVE
KETONES UA: NEGATIVE
Nitrite, UA: NEGATIVE
Odor: NEGATIVE
Spec Grav, UA: 1.01 (ref 1.010–1.025)
Urobilinogen, UA: 0.2 E.U./dL
pH, UA: 6.5 (ref 5.0–8.0)

## 2017-04-25 MED ORDER — NITROFURANTOIN MONOHYD MACRO 100 MG PO CAPS: 100.0000 mg | ORAL_CAPSULE | Freq: Two times a day (BID) | ORAL | 0 refills | Status: DC

## 2017-04-25 NOTE — Patient Instructions (Signed)
1.  Pessary maintenance is completed today. 2.  Return in 8 weeks for pessary maintenance 3.  Macrobid twice a day for 7 days is recommended due to UTI symptoms.  Antibiotic is given while urine culture is pending because of upcoming surgery.

## 2017-04-25 NOTE — Progress Notes (Signed)
Chief complaint: 1.  Pessary maintenance 2.  Cystocele; uterovaginal prolapse, incomplete 3.  Vaginal atrophy 4.  Pelvic pain  Patient presents for 6-week pessary maintenance.  Previously she had a pessary holiday because of a vaginal ulceration/abrasion.  Since the pessary was reinserted she is not experiencing any vaginal discharge, vaginal odor, or vaginal bleeding. However, she is experiencing some suprapubic pelvic pain.  She did have a UTI that was treated previously and has similar symptoms today.  She does not report urinary frequency or urgency.  Pessary type: #5 ring with diaphragm support Medications: Trimosan gel intravaginally weekly  Past Medical History:  Diagnosis Date  . Arthritis   . Female bladder prolapse   . Fibroid, uterine   . GERD (gastroesophageal reflux disease)   . History of abnormal cervical Pap smear   . History of frequent urinary tract infections   . History of urinary tract infection   . Hypercholesteremia   . Hypertension   . Hypothyroid   . Menopause   . Nocturia   . OP (osteoporosis)   . Pessary maintenance    # 5 ring with support  . PONV (postoperative nausea and vomiting)    many yrs ago  . Urethral caruncle   . Urinary incontinence   . Urinary urgency   . Vaginal atrophy   . Vaginal pessary present    remains in place.  . Vaginal vault prolapse     Past Surgical History:  Procedure Laterality Date  . CATARACT EXTRACTION    . EYE SURGERY     cataract surgery bilat   . EYE SURGERY     laser eye surgery left eye"retinal acclution"laser  . HERNIA REPAIR    . THYROGLOSSAL DUCT CYST    . THYROIDECTOMY    . TONSILLECTOMY    . TONSILLECTOMY    . TOTAL HIP ARTHROPLASTY     left hip 7 years ago   . TOTAL HIP ARTHROPLASTY Right 08/26/2014   Procedure: RIGHT TOTAL HIP ARTHROPLASTY ANTERIOR APPROACH;  Surgeon: Gaynelle Arabian, MD;  Location: [facility];  Service: Orthopedics;  Laterality: Right;  . TOTAL KNEE ARTHROPLASTY Left 10/25/2015   Procedure: LEFT TOTAL KNEE ARTHROPLASTY;  Surgeon: Gaynelle Arabian, MD;  Location: [facility];  Service: Orthopedics;  Laterality: Left;    Review of systems: Comprehensive review of systems is negative except for that noted in the HPI OBJECTIVE: BP (!) 145/81   Pulse 74   Ht 5\' 5"  (1.651 m)   Wt 141 lb 8 oz (64.2 kg)   BMI 23.55 kg/m  Pleasant well-appearing female in no acute distress.  Alert and oriented. Abdomen: Soft, nontender organomegaly; the suprapubic region is tender to palpation. Pelvic exam: External genitalia-normal; introitus slightly narrowed BUS-normal Vagina-mild to moderate atrophy; no vaginal discharge or evidence of vaginal bleeding; no vaginal malodor; the anterior vagina is tender to palpation in the suburethral region cervix-no lesions; no cervical motion tenderness Uterus-small, nontender Adnexa-nonpalpable nontender Rectovaginal-normal external exam  PROCEDURE: #5 ring with diaphragm support pessary is removed, cleaned, and reinserted  ASSESSMENT: 1.  Second to third-degree cystocele; uterovaginal prolapse, incomplete, controlled with pessary use 2.  Suprapubic pain, likely consistent with recurrent UTI 3.  History of vaginal erosion, resolved 4.  Upcoming parathyroid surgery this week  PLAN: 1.  Pessary is removed, cleaned, and reinserted. 2.  Continue using Trimosan gel intravaginally weekly 3.  Urinalysis and urine culture is obtained. 4.  Macrobid twice daily for 7 days for suspected UTI.  Medication as prescribed now  because of patient's upcoming surgery.  Results of testing will be made available 5.  Return in 6 weeks for pessary maintenance  A total of 15 minutes were spent face-to-face with the patient during this encounter and over half of that time dealt with counseling and coordination of care.  Brayton Mars, MD  Note: This dictation was prepared with Dragon dictation along with smaller phrase technology. Any transcriptional errors that  result from this process are unintentional.

## 2017-04-27 LAB — URINE CULTURE

## 2017-05-01 ENCOUNTER — Telehealth: Payer: Self-pay | Admitting: Obstetrics and Gynecology

## 2017-05-01 NOTE — Telephone Encounter (Signed)
The patient called and stated that she missed a call from Marie that she will be leaving for the "school house" at 2:30 and she will be available after 3 pm today. Please advise.

## 2017-05-01 NOTE — Telephone Encounter (Signed)
Spoke with pt- see lab results

## 2017-05-02 DIAGNOSIS — Z9889 Other specified postprocedural states: Secondary | ICD-10-CM

## 2017-05-02 DIAGNOSIS — E892 Postprocedural hypoparathyroidism: Secondary | ICD-10-CM

## 2017-05-02 HISTORY — DX: Postprocedural hypoparathyroidism: E89.2

## 2017-05-02 HISTORY — DX: Other specified postprocedural states: Z98.890

## 2017-05-10 ENCOUNTER — Telehealth: Payer: Self-pay | Admitting: Obstetrics and Gynecology

## 2017-05-10 NOTE — Telephone Encounter (Signed)
LMTRC

## 2017-05-10 NOTE — Telephone Encounter (Signed)
The patient called and stated that she would like to speak to Crystal Dr. Duard Brady nurse. Please advise.

## 2017-05-14 NOTE — Telephone Encounter (Signed)
lmtrc

## 2017-05-16 NOTE — Telephone Encounter (Signed)
Pt has an appt   Tomorrow.   Chart filed.

## 2017-05-17 ENCOUNTER — Encounter: Payer: Self-pay | Admitting: Obstetrics and Gynecology

## 2017-05-17 ENCOUNTER — Ambulatory Visit: Payer: Medicare Other | Admitting: Obstetrics and Gynecology

## 2017-05-17 VITALS — BP 149/89 | HR 67 | Ht 65.0 in | Wt 143.2 lb

## 2017-05-17 DIAGNOSIS — T8389XD Other specified complication of genitourinary prosthetic devices, implants and grafts, subsequent encounter: Secondary | ICD-10-CM | POA: Diagnosis not present

## 2017-05-17 DIAGNOSIS — N8111 Cystocele, midline: Secondary | ICD-10-CM | POA: Diagnosis not present

## 2017-05-17 DIAGNOSIS — N898 Other specified noninflammatory disorders of vagina: Secondary | ICD-10-CM | POA: Diagnosis not present

## 2017-05-17 DIAGNOSIS — N390 Urinary tract infection, site not specified: Secondary | ICD-10-CM | POA: Diagnosis not present

## 2017-05-17 DIAGNOSIS — N812 Incomplete uterovaginal prolapse: Secondary | ICD-10-CM

## 2017-05-17 LAB — POCT URINALYSIS DIPSTICK
Bilirubin, UA: NEGATIVE
GLUCOSE UA: NEGATIVE
KETONES UA: NEGATIVE
Nitrite, UA: NEGATIVE
Odor: NEGATIVE
Protein, UA: NEGATIVE
Urobilinogen, UA: 0.2 E.U./dL
pH, UA: 6 (ref 5.0–8.0)

## 2017-05-17 NOTE — Progress Notes (Signed)
Chief complaint: 1.  Pessary maintenance 2.  Midline cystocele 3.  Vaginal bleeding  Haley Schneider presents today for early follow-up for pessary maintenance.  She had noted some vaginal spotting last week which has not recurred.  She reports some mild urinary frequency. She does have history of prior vaginal erosion.  Past medical history, past surgical history, problem list, medications, and allergies are reviewed  OBJECTIVE: BP (!) 149/89   Pulse 67   Ht 5\' 5"  (1.651 m)   Wt 143 lb 3.2 oz (65 kg)   BMI 23.83 kg/m  Pleasant elderly female no acute distress.  Alert and oriented. Abdomen: Soft, nontender without organomegaly Pelvic exam: External genitalia-normal; introitus slightly narrowed BUS-normal Vagina-mild to moderate atrophy; vaginal erosion is noted in the posterior proximal vagina, friable when palpated with Fox swab   cervix-no lesions; no cervical motion tenderness Uterus-small, nontender Adnexa-nonpalpable nontender Rectovaginal-normal external exam  PROCEDURE: #5 ring with diaphragm support pessary is removed, cleaned, and not reinserted  ASSESSMENT: 1.  Second to third-degree cystocele; uterovaginal prolapse, incomplete, controlled with pessary use 2.  Vaginal erosion, symptomatic 3.  Status post parathyroid surgery, successful 4.  Urinary frequency with history of recurrent UTI; urinalysis shows blood in urine  PLAN: 1.  The #5 ring pessary is removed and not reinserted today 2.  Pessary is to be left out for 4 weeks 3.  Return in 4 weeks for reassessment of vaginal erosion and probable pessary insertion 4.  UA with CNS is obtained today.  No medications given until culture is obtained.  A total of 15 minutes were spent face-to-face with the patient during this encounter and over half of that time dealt with counseling and coordination of care.  Brayton Mars, MD  Note: This dictation was prepared with Dragon dictation along with smaller phrase  technology. Any transcriptional errors that result from this process are unintentional.

## 2017-05-17 NOTE — Patient Instructions (Signed)
1.  Pessary is removed and not reinserted today. 2.  Small vaginal abrasion/erosion is noted. 3.  Return June 19 for follow-up as scheduled.  Pessary will be left out until that time unless significant symptoms develop.

## 2017-05-18 ENCOUNTER — Encounter: Payer: Self-pay | Admitting: Family Medicine

## 2017-05-19 LAB — URINE CULTURE

## 2017-05-21 ENCOUNTER — Telehealth: Payer: Self-pay

## 2017-05-21 MED ORDER — SULFAMETHOXAZOLE-TRIMETHOPRIM 800-160 MG PO TABS: 1.0000 | ORAL_TABLET | Freq: Two times a day (BID) | ORAL | 0 refills | Status: DC

## 2017-05-21 NOTE — Telephone Encounter (Signed)
lmtrc

## 2017-05-21 NOTE — Telephone Encounter (Signed)
Line busy. Will try later.  Med erx.

## 2017-05-21 NOTE — Telephone Encounter (Signed)
-----   Message from Brayton Mars, MD sent at 05/21/2017  7:35 AM EDT ----- Please notify - Abnormal Labs Septra DS twice daily x 7 days

## 2017-05-21 NOTE — Telephone Encounter (Signed)
Pt aware. Med erx. 

## 2017-05-30 ENCOUNTER — Ambulatory Visit: Payer: Medicare Other | Admitting: Family Medicine

## 2017-05-30 ENCOUNTER — Encounter: Payer: Self-pay | Admitting: Family Medicine

## 2017-05-30 VITALS — BP 132/62 | HR 93 | Temp 98.1°F | Resp 14 | Ht 65.0 in | Wt 139.9 lb

## 2017-05-30 DIAGNOSIS — E890 Postprocedural hypothyroidism: Secondary | ICD-10-CM

## 2017-05-30 DIAGNOSIS — E559 Vitamin D deficiency, unspecified: Secondary | ICD-10-CM | POA: Diagnosis not present

## 2017-05-30 DIAGNOSIS — Z5181 Encounter for therapeutic drug level monitoring: Secondary | ICD-10-CM

## 2017-05-30 DIAGNOSIS — E349 Endocrine disorder, unspecified: Secondary | ICD-10-CM | POA: Diagnosis not present

## 2017-05-30 DIAGNOSIS — I10 Essential (primary) hypertension: Secondary | ICD-10-CM | POA: Diagnosis not present

## 2017-05-30 DIAGNOSIS — M810 Age-related osteoporosis without current pathological fracture: Secondary | ICD-10-CM | POA: Diagnosis not present

## 2017-05-30 DIAGNOSIS — E782 Mixed hyperlipidemia: Secondary | ICD-10-CM | POA: Diagnosis not present

## 2017-05-30 NOTE — Assessment & Plan Note (Signed)
Resolved; thanks to Dr. Maudie Mercury, noted last normal level

## 2017-05-30 NOTE — Assessment & Plan Note (Signed)
Weight-bearing exercise, fall precautions; PTH back to normal; next DEXA due June 2020

## 2017-05-30 NOTE — Patient Instructions (Addendum)
Do call Dr. Buckner Malta office for your symptoms We'll get labs today If you have not heard anything from my staff in a week about any orders/referrals/studies from today, please contact us here to follow-up [phone] Try to follow the DASH guidelines (DASH stands for Dietary Approaches to Stop Hypertension). Try to limit the sodium in your diet to no more than 1,500mg  of sodium per day. Certainly try to not exceed 2,000 mg per day at the very most. Do not add salt when cooking or at the table.  Check the sodium amount on labels when shopping, and choose items lower in sodium when given a choice. Avoid or limit foods that already contain a lot of sodium. Eat a diet rich in fruits and vegetables and whole grains, and try to lose weight if overweight or obese

## 2017-05-30 NOTE — Assessment & Plan Note (Signed)
Check level and supplement if needed 

## 2017-05-30 NOTE — Assessment & Plan Note (Signed)
Check TSH 

## 2017-05-30 NOTE — Progress Notes (Signed)
BP 132/62   Pulse 93   Temp 98.1 F (36.7 C) (Oral)   Resp 14   Ht 5\' 5"  (1.651 m)   Wt 139 lb 14.4 oz (63.5 kg)   SpO2 96%   BMI 23.28 kg/m    Subjective:    Patient ID: Haley Schneider, female    DOB: 06-26-1935, 82 y.o.   MRN: [id number]  HPI: Haley Schneider is a 82 y.o. female  Chief Complaint  Patient presents with  . Follow-up    HPI Patient is here for follow-up  Hyperparathyroidism; Dr. Maudie Mercury sent a letter with normal PTH and normal calcium and turned her loose; on calcium and vitamin D supplementation  HTN; on ARB; does not add salt to her food; no chest pain or SHOB  High cholesterol; has been off of the medicine since last time; wants to see what the labs are just with her diet; hardly ever eats eggs; using egg beaters instead; sprinkles cheese on things and has cheese toast; steak once in a while, likes burgers, roast beef; more chicken than anything; last LDL was 131  Osteoporosis; weight-bearing exercise; no risky behaviors; takes her time getting up; stiff with arthritis; calcium and vit D; last DEXA June 2018 through [facility]; T score -3.7 forearm; lumbar spine not included because of scoliosis; taking fosamax  Hypothyroidism; weight stable overall; moving bowels fine; energy level is good  She thinks she is having a UTI; she will call Dr. Buckner Malta office; wearing pessary and he checks her real good inside  Depression screen [facility] 2/9 05/30/2017 11/29/2016 03/08/2016 09/01/2015 03/10/2015  Decreased Interest 0 0 0 0 0  Down, Depressed, Hopeless 0 0 0 0 0  PHQ - 2 Score 0 0 0 0 0    Relevant past medical, surgical, family and social history reviewed Past Medical History:  Diagnosis Date  . Arthritis   . Female bladder prolapse   . Fibroid, uterine   . GERD (gastroesophageal reflux disease)   . History of abnormal cervical Pap smear   . History of frequent urinary tract infections   . History of urinary tract infection   . Hypercholesteremia   . Hypertension   .  Hypothyroid   . Menopause   . Nocturia   . OP (osteoporosis)   . Pessary maintenance    # 5 ring with support  . PONV (postoperative nausea and vomiting)    many yrs ago  . Status post removal of part of parathyroid ([place]) 05/02/2017  . Urethral caruncle   . Urinary incontinence   . Urinary urgency   . Vaginal atrophy   . Vaginal pessary present    remains in place.  . Vaginal vault prolapse    Past Surgical History:  Procedure Laterality Date  . CATARACT EXTRACTION    . EYE SURGERY     cataract surgery bilat   . EYE SURGERY     laser eye surgery left eye"retinal acclution"laser  . HERNIA REPAIR    . THYROGLOSSAL DUCT CYST    . THYROIDECTOMY    . TONSILLECTOMY    . TONSILLECTOMY    . TOTAL HIP ARTHROPLASTY     left hip 7 years ago   . TOTAL HIP ARTHROPLASTY Right 08/26/2014   Procedure: RIGHT TOTAL HIP ARTHROPLASTY ANTERIOR APPROACH;  Surgeon: Gaynelle Arabian, MD;  Location: [facility];  Service: Orthopedics;  Laterality: Right;  . TOTAL KNEE ARTHROPLASTY Left 10/25/2015   Procedure: LEFT TOTAL KNEE ARTHROPLASTY;  Surgeon: Gaynelle Arabian,  MD;  Location: [facility];  Service: Orthopedics;  Laterality: Left;   Family History  Problem Relation Age of Onset  . Alzheimer's disease Mother   . Cancer Father        lung  . Hypertension Father   . Stroke Paternal Grandfather   . Tuberculosis Paternal Grandmother   . Diabetes Son   . Breast cancer Paternal Aunt   . Hypertension Sister   . Hypertension Sister   . Heart disease Neg Hx    Social History   Tobacco Use  . Smoking status: Never Smoker  . Smokeless tobacco: Never Used  Substance Use Topics  . Alcohol use: No  . Drug use: No    Interim medical history since last visit reviewed. Allergies and medications reviewed  Review of Systems Per HPI unless specifically indicated above     Objective:    BP 132/62   Pulse 93   Temp 98.1 F (36.7 C) (Oral)   Resp 14   Ht 5\' 5"  (1.651 m)   Wt 139 lb 14.4 oz (63.5 kg)    SpO2 96%   BMI 23.28 kg/m   Wt Readings from Last 3 Encounters:  05/30/17 139 lb 14.4 oz (63.5 kg)  05/17/17 143 lb 3.2 oz (65 kg)  04/25/17 141 lb 8 oz (64.2 kg)    Physical Exam  Constitutional: She appears well-developed and well-nourished. No distress.  HENT:  Head: Normocephalic and atraumatic.  Eyes: EOM are normal. No scleral icterus.  Neck: No thyromegaly present.  Cardiovascular: Normal rate, regular rhythm and normal heart sounds.  No murmur heard. Pulmonary/Chest: Effort normal and breath sounds normal. No respiratory distress. She has no wheezes.  Abdominal: Soft. Bowel sounds are normal. She exhibits no distension.  Musculoskeletal: She exhibits no edema.  Neurological: She is alert. She exhibits normal muscle tone.  Skin: Skin is warm and dry. She is not diaphoretic. No pallor.  Psychiatric: She has a normal mood and affect. Her behavior is normal. Judgment and thought content normal.      Assessment & Plan:   Problem List Items Addressed This Visit      Cardiovascular and Mediastinum   Hypertension - Primary    Under excellent control; limit salt; try to follow DASH guidelines        Endocrine   Hypothyroidism    Check TSH      Relevant Orders   TSH     Musculoskeletal and Integument   Osteoporosis    Weight-bearing exercise, fall precautions; PTH back to normal; next DEXA due June 2020        Other   Vitamin D deficiency    Check level and supplement if needed      Relevant Orders   VITAMIN D 25 Hydroxy (Vit-D Deficiency, Fractures)   Medication monitoring encounter    Check liver and kidneys      Relevant Orders   COMPLETE METABOLIC PANEL WITH GFR   Hyperlipidemia    Check today and encouraged healthy diet      Relevant Orders   Lipid panel   Hypercalcemia    resolved      Elevated parathyroid hormone    Resolved; thanks to Dr. Maudie Mercury, noted last normal level          Follow up plan: Return in about 6 months (around  11/30/2017) for follow-up visit with Dr. Sanda Klein; Medicare Wellness with [facility].  An after-visit summary was printed and given to the patient at [place].  Please  see the patient instructions which may contain other information and recommendations beyond what is mentioned above in the assessment and plan.  No orders of the defined types were placed in this encounter.   Orders Placed This Encounter  Procedures  . VITAMIN D 25 Hydroxy (Vit-D Deficiency, Fractures)  . TSH  . Lipid panel  . COMPLETE METABOLIC PANEL WITH GFR

## 2017-05-30 NOTE — Assessment & Plan Note (Signed)
resolved 

## 2017-05-30 NOTE — Assessment & Plan Note (Signed)
Under excellent control; limit salt; try to follow DASH guidelines

## 2017-05-30 NOTE — Assessment & Plan Note (Signed)
Check today and encouraged healthy diet

## 2017-05-30 NOTE — Assessment & Plan Note (Signed)
Check liver and kidneys 

## 2017-05-31 LAB — COMPLETE METABOLIC PANEL WITH GFR
AG RATIO: 1.7 (calc) (ref 1.0–2.5)
ALBUMIN MSPROF: 4.2 g/dL (ref 3.6–5.1)
ALT: 23 U/L (ref 6–29)
AST: 21 U/L (ref 10–35)
Alkaline phosphatase (APISO): 103 U/L (ref 33–130)
BUN: 12 mg/dL (ref 7–25)
CALCIUM: 8.9 mg/dL (ref 8.6–10.4)
CO2: 27 mmol/L (ref 20–32)
CREATININE: 0.76 mg/dL (ref 0.60–0.88)
Chloride: 97 mmol/L — ABNORMAL LOW (ref 98–110)
GFR, EST AFRICAN AMERICAN: 86 mL/min/{1.73_m2} (ref >=60)
GFR, EST NON AFRICAN AMERICAN: 74 mL/min/{1.73_m2} (ref >=60)
GLOBULIN: 2.5 g/dL (ref 1.9–3.7)
Glucose, Bld: 86 mg/dL (ref 65–99)
POTASSIUM: 4.6 mmol/L (ref 3.5–5.3)
SODIUM: 131 mmol/L — AB (ref 135–146)
Total Bilirubin: 0.5 mg/dL (ref 0.2–1.2)
Total Protein: 6.7 g/dL (ref 6.1–8.1)

## 2017-05-31 LAB — LIPID PANEL
CHOL/HDL RATIO: 3.3 (calc) (ref <5.0)
CHOLESTEROL: 206 mg/dL — AB (ref <200)
HDL: 63 mg/dL (ref >50)
LDL Cholesterol (Calc): 118 mg/dL (calc) — ABNORMAL HIGH
Non-HDL Cholesterol (Calc): 143 mg/dL (calc) — ABNORMAL HIGH (ref <130)
Triglycerides: 142 mg/dL (ref <150)

## 2017-05-31 LAB — VITAMIN D 25 HYDROXY (VIT D DEFICIENCY, FRACTURES): VIT D 25 HYDROXY: 37 ng/mL (ref 30–100)

## 2017-05-31 LAB — TSH: TSH: 1.83 m[IU]/L (ref 0.40–4.50)

## 2017-06-04 ENCOUNTER — Other Ambulatory Visit: Payer: Self-pay | Admitting: Family Medicine

## 2017-06-04 DIAGNOSIS — Z1231 Encounter for screening mammogram for malignant neoplasm of breast: Secondary | ICD-10-CM

## 2017-06-05 ENCOUNTER — Other Ambulatory Visit: Payer: Self-pay

## 2017-06-05 DIAGNOSIS — E871 Hypo-osmolality and hyponatremia: Secondary | ICD-10-CM

## 2017-06-15 ENCOUNTER — Ambulatory Visit
Admission: RE | Admit: 2017-06-15 | Discharge: 2017-06-15 | Disposition: A | Discharge status: Home / Self Care | Payer: Medicare Other | Source: Ambulatory Visit | Attending: Family Medicine | Admitting: Family Medicine

## 2017-06-15 DIAGNOSIS — Z1231 Encounter for screening mammogram for malignant neoplasm of breast: Secondary | ICD-10-CM | POA: Insufficient documentation

## 2017-06-20 ENCOUNTER — Ambulatory Visit: Payer: Medicare Other | Admitting: Obstetrics and Gynecology

## 2017-06-20 ENCOUNTER — Encounter: Payer: Self-pay | Admitting: Obstetrics and Gynecology

## 2017-06-20 VITALS — BP 146/79 | HR 76 | Ht 65.0 in | Wt 143.3 lb

## 2017-06-20 DIAGNOSIS — N898 Other specified noninflammatory disorders of vagina: Secondary | ICD-10-CM | POA: Diagnosis not present

## 2017-06-20 DIAGNOSIS — N812 Incomplete uterovaginal prolapse: Secondary | ICD-10-CM | POA: Diagnosis not present

## 2017-06-20 DIAGNOSIS — N8111 Cystocele, midline: Secondary | ICD-10-CM

## 2017-06-20 DIAGNOSIS — T8389XD Other specified complication of genitourinary prosthetic devices, implants and grafts, subsequent encounter: Secondary | ICD-10-CM | POA: Diagnosis not present

## 2017-06-20 NOTE — Patient Instructions (Signed)
1.  Leave pessary out for 4 weeks 2.  Insert Premarin cream 1/2 g intravaginal twice a week until next visit 3.  Return in 4 weeks for reassessment of vaginal abrasion and probable pessary insertion

## 2017-06-20 NOTE — Progress Notes (Signed)
Chief complaint: 1.  Second to third-degree cystocele 2.  Uterovaginal prolapse, incomplete 3.  History of vaginal erosion 4.  History of urinary frequency and recurrent UTI 5.  Pessary maintenance  Haley Schneider presents today for pessary follow-up.  The pessary has been out since her last visit (05/17/2017) because of a vaginal erosion.  She has noted no recent episodes of vaginal bleeding, vaginal discharge, or pelvic pain.  She has noted no vaginal odor.  Pelvic pressure tends to wax and wane especially when she is on her feet for prolonged periods of time; she states that when she is symptomatic she just lies down and the pressure symptoms abate with gradual correction of the prolapse while in the supine position.  Urinary function has been stable.  She has been able to completely evacuate.  Bowel function is normal.  Past Medical History:  Diagnosis Date  . Arthritis   . Female bladder prolapse   . Fibroid, uterine   . GERD (gastroesophageal reflux disease)   . History of abnormal cervical Pap smear   . History of frequent urinary tract infections   . History of urinary tract infection   . Hypercholesteremia   . Hypertension   . Hypothyroid   . Menopause   . Nocturia   . OP (osteoporosis)   . Pessary maintenance    # 5 ring with support  . PONV (postoperative nausea and vomiting)    many yrs ago  . Status post removal of part of parathyroid ([place]) 05/02/2017  . Urethral caruncle   . Urinary incontinence   . Urinary urgency   . Vaginal atrophy   . Vaginal pessary present    remains in place.  . Vaginal vault prolapse    Past Surgical History:  Procedure Laterality Date  . CATARACT EXTRACTION    . EYE SURGERY     cataract surgery bilat   . EYE SURGERY     laser eye surgery left eye"retinal acclution"laser  . HERNIA REPAIR    . THYROGLOSSAL DUCT CYST    . THYROIDECTOMY    . TONSILLECTOMY    . TONSILLECTOMY    . TOTAL HIP ARTHROPLASTY     left hip 7 years ago   . TOTAL HIP  ARTHROPLASTY Right 08/26/2014   Procedure: RIGHT TOTAL HIP ARTHROPLASTY ANTERIOR APPROACH;  Surgeon: Gaynelle Arabian, MD;  Location: [facility];  Service: Orthopedics;  Laterality: Right;  . TOTAL KNEE ARTHROPLASTY Left 10/25/2015   Procedure: LEFT TOTAL KNEE ARTHROPLASTY;  Surgeon: Gaynelle Arabian, MD;  Location: [facility];  Service: Orthopedics;  Laterality: Left;    Review of systems: Comprehensive review of systems is negative except for that noted in the HPI   OBJECTIVE: BP (!) 146/79   Pulse 76   Ht 5\' 5"  (1.651 m)   Wt 143 lb 4.8 oz (65 kg)   BMI 23.85 kg/m   Pleasant well-appearing white female in no acute distress.  Alert and oriented. BACK: No CVA tenderness ABDOMEN: Soft, nontender, without organomegaly PELVIC EXAM External genitalia-normal; the introitus is slightly narrowed BUS-normal Vagina-slight decrease estrogen effect; previously noted abrasion along right posterior vaginal wall is re-epithelializing (90% complete) Cervix-no lesions; no cervical motion tenderness Uterus-small, nontender Adnexa-nonpalpable nontender Rectovaginal-normal external exam  ASSESSMENT: 1.  Second to third-degree cystocele; uterovaginal prolapse, incomplete, currently stable with pessary not utilized over the past 4 weeks to allow vaginal erosion to heal 2.  Vaginal erosion, 90% healed 3.  History of recurrent UTIs, without symptoms today  PLAN: 1.  Leave the #5 ring pessary out for another 4 weeks 2.  Insert Premarin cream 1/2 g intravaginal twice a week until next visit 3.  Return in 4 weeks for reassessment of vaginal erosion and probable pessary insertion.  If symptoms of prolapse worsen in pessary is desired to be inserted sooner, please contact the office.  A total of 15 minutes were spent face-to-face with the patient during this encounter and over half of that time dealt with counseling and coordination of care.  Brayton Mars, MD  Note: This dictation was prepared with Dragon  dictation along with smaller phrase technology. Any transcriptional errors that result from this process are unintentional.

## 2017-07-06 ENCOUNTER — Telehealth: Payer: Self-pay

## 2017-07-06 MED ORDER — LOSARTAN POTASSIUM 50 MG PO TABS: 50.0000 mg | ORAL_TABLET | Freq: Every day | ORAL | 0 refills | Status: DC

## 2017-07-06 NOTE — Telephone Encounter (Signed)
Please remind patient that she is due for a recheck BMP I'll refill her medicine

## 2017-07-06 NOTE — Telephone Encounter (Signed)
Refill request was sent to Dr. Melinda Lada for approval and submission.  

## 2017-07-10 NOTE — Telephone Encounter (Signed)
Pt.notified

## 2017-07-11 LAB — BASIC METABOLIC PANEL WITH GFR
BUN: 9 mg/dL (ref 7–25)
CHLORIDE: 101 mmol/L (ref 98–110)
CO2: 27 mmol/L (ref 20–32)
Calcium: 8.8 mg/dL (ref 8.6–10.4)
Creat: 0.67 mg/dL (ref 0.60–0.88)
GFR, Est African American: 96 mL/min/{1.73_m2} (ref >=60)
GFR, Est Non African American: 83 mL/min/{1.73_m2} (ref >=60)
GLUCOSE: 84 mg/dL (ref 65–99)
POTASSIUM: 4.3 mmol/L (ref 3.5–5.3)
SODIUM: 137 mmol/L (ref 135–146)

## 2017-07-18 ENCOUNTER — Encounter: Payer: Self-pay | Admitting: Obstetrics and Gynecology

## 2017-07-18 ENCOUNTER — Ambulatory Visit: Payer: Medicare Other | Admitting: Obstetrics and Gynecology

## 2017-07-18 VITALS — BP 126/79 | HR 76 | Ht 65.0 in | Wt 143.3 lb

## 2017-07-18 DIAGNOSIS — N898 Other specified noninflammatory disorders of vagina: Secondary | ICD-10-CM

## 2017-07-18 DIAGNOSIS — T8389XD Other specified complication of genitourinary prosthetic devices, implants and grafts, subsequent encounter: Secondary | ICD-10-CM | POA: Diagnosis not present

## 2017-07-18 DIAGNOSIS — N8111 Cystocele, midline: Secondary | ICD-10-CM

## 2017-07-18 DIAGNOSIS — N812 Incomplete uterovaginal prolapse: Secondary | ICD-10-CM

## 2017-07-18 NOTE — Progress Notes (Signed)
Chief complaint: 1.  Follow-up on vaginal erosion 2.  Incomplete uterovaginal prolapse 3.  Cystocele, midline  Patient presents today for follow-up.  At last visit on 06/20/2017 the pessary was left out because of the ongoing healing of a vaginal sidewall erosion-it was 90% complete 4 weeks ago.  Patient reports no significant vaginal discharge, vaginal bleeding, or vaginal odor.  She does report increased pelvic pressure and difficulty with maintaining urinary continence with the pessary out, being that she is doing significant amounts of yard work at this time.  She desires the pessary to be inserted today if clinically acceptable. Bowel function is normal. No recent UTI symptoms.  Past no history, past surgical history, problem list, medications, and allergies are reviewed  OBJECTIVE: There were no vitals taken for this visit.  Well-appearing female in no acute distress.  Alert and oriented.  Affect is appropriate. Back: No CVA tenderness Abdomen: Soft, nontender without organomegaly; no guarding Bladder: Nontender Pelvic: External genitalia-normal BUS-normal Vagina-fair estrogen effect; vaginal abrasion along right posterior vaginal wall is healed Cervix-no lesions; no cervical motion tenderness Uterus-midplane mobile nontender normal size Adnexa-nonpalpable nontender Rectovaginal-normal external exam  PROCEDURE: The #5 ring with diaphragm support pessary is inserted  ASSESSMENT: 1.  Second to third-degree cystocele; more symptomatic with pessary out at this time 2.  Incomplete uterovaginal prolapse, more symptomatic without pessary 3.  Vaginal erosion, now healed 4.  History of recurrent UTIs; asymptomatic today  PLAN: 1.  #5 ring with diaphragm support pessary is inserted 2.  Trimosan gel intravaginal weekly is to be performed 3.  Return in 6 weeks for pessary maintenance  A total of 15 minutes were spent face-to-face with the patient during this encounter and over half  of that time dealt with counseling and coordination of care.  Brayton Mars, MD  Note: This dictation was prepared with Dragon dictation along with smaller phrase technology. Any transcriptional errors that result from this process are unintentional.

## 2017-07-18 NOTE — Patient Instructions (Signed)
1.  Ring with diaphragm support pessary is inserted today 2.  Continue using Trimosan gel intravaginal weekly 3.  Return in 6 weeks for pessary maintenance

## 2017-08-04 ENCOUNTER — Other Ambulatory Visit: Payer: Self-pay | Admitting: Family Medicine

## 2017-08-04 NOTE — Telephone Encounter (Signed)
Last Cr and K+ reviewed; one year of refills approved of the ARB

## 2017-08-29 ENCOUNTER — Encounter: Payer: Medicare Other | Admitting: Obstetrics and Gynecology

## 2017-09-12 ENCOUNTER — Encounter: Payer: Medicare Other | Admitting: Obstetrics and Gynecology

## 2017-09-13 ENCOUNTER — Other Ambulatory Visit: Payer: Self-pay | Admitting: Family Medicine

## 2017-09-19 ENCOUNTER — Encounter: Payer: Self-pay | Admitting: Obstetrics and Gynecology

## 2017-09-19 ENCOUNTER — Ambulatory Visit: Payer: Medicare Other | Admitting: Obstetrics and Gynecology

## 2017-09-19 VITALS — BP 130/78 | HR 76 | Ht 65.0 in | Wt 145.2 lb

## 2017-09-19 DIAGNOSIS — N952 Postmenopausal atrophic vaginitis: Secondary | ICD-10-CM

## 2017-09-19 DIAGNOSIS — N812 Incomplete uterovaginal prolapse: Secondary | ICD-10-CM

## 2017-09-19 DIAGNOSIS — Z4689 Encounter for fitting and adjustment of other specified devices: Secondary | ICD-10-CM | POA: Diagnosis not present

## 2017-09-19 DIAGNOSIS — N8111 Cystocele, midline: Secondary | ICD-10-CM | POA: Diagnosis not present

## 2017-09-19 NOTE — Patient Instructions (Signed)
1.  Continue with Trimosan gel intravaginal weekly 2.  Return in mid December for pessary maintenance

## 2017-09-20 ENCOUNTER — Encounter: Payer: Self-pay | Admitting: Obstetrics and Gynecology

## 2017-09-20 ENCOUNTER — Other Ambulatory Visit: Payer: Self-pay

## 2017-09-20 DIAGNOSIS — E890 Postprocedural hypothyroidism: Secondary | ICD-10-CM

## 2017-09-20 NOTE — Progress Notes (Signed)
Chief complaint: 1.  Pessary maintenance 2.  Incomplete uterovaginal prolapse 3.  Cystocele, midline 4.  History of vaginal erosion  Pessary follow-up, 6 weeks (last visit-07/18/2017) Pessary type: #5 ring with diaphragm support Medications: Trimosan gel intravaginal weekly Management type: Physician Symptoms: Patient denies vaginal bleeding, vaginal discharge, vaginal odor, pelvic/vaginal pain.  Past medical history, past surgical history, problem list, medications, and allergies are reviewed  OBJECTIVE: BP 130/78   Pulse 76   Ht 5\' 5"  (1.651 m)   Wt 145 lb 3.2 oz (65.9 kg)   BMI 24.16 kg/m  Pleasant well-appearing female in no acute distress.  Alert and oriented.  Affect is appropriate. Abdomen: Soft, nontender without organomegaly Pelvic exam: External genitalia-normal BUS-normal Vagina-fair estrogen effect; no vaginal abrasions; no discharge; cystocele is present Cervix-no lesions; no cervical motion tenderness Uterus-midplane, mobile, nontender, normal size; prolapse is noted to mid vagina Adnexa-nonpalpable nontender Rectovaginal-normal external exam  PROCEDURE: #5 ring with diaphragm support pessary is removed, cleaned, and reinserted.  ASSESSMENT: 1.  Normal pessary maintenance 2.  History of incomplete uterovaginal prolapse, controlled with pessary 3.  Cystocele, midline, controlled with pessary 4.  History of vaginal erosion with no evidence of recurrence today  PLAN: 1.  Pessary is removed, cleaned, and reinserted 2.  Patient is to continue with Trimosan gel intravaginal weekly 3.  Patient is to return in mid December for next pessary maintenance evaluation  A total of 15 minutes were spent face-to-face with the patient during this encounter and over half of that time dealt with counseling and coordination of care.  Brayton Mars, MD  Note: This dictation was prepared with Dragon dictation along with smaller phrase technology. Any transcriptional  errors that result from this process are unintentional.

## 2017-09-21 MED ORDER — SYNTHROID 50 MCG PO TABS: ORAL_TABLET | ORAL | 0 refills | Status: DC

## 2017-10-03 ENCOUNTER — Telehealth: Payer: Self-pay | Admitting: Family Medicine

## 2017-10-03 MED ORDER — VALSARTAN 80 MG PO TABS: 80.0000 mg | ORAL_TABLET | Freq: Every day | ORAL | 0 refills | Status: DC

## 2017-10-03 NOTE — Telephone Encounter (Signed)
Stop losartan Start valsartan, new Rx sent Come by in 2 weeks for BP recheck with CMA

## 2017-10-03 NOTE — Telephone Encounter (Signed)
Copied from [place] [phone]. Topic: Quick Communication - Rx Refill/Question >> Oct 03, 2017 10:26 AM Burchel, Abbi R wrote: Medication: losartan (COZAAR) 50 MG tablet  Pt states she was told by her Pharmacy that they will not refill this medication due to cancer risk.  Pt would like to know if Dr Sanda Klein can prescribe something different.  Please advise. Pt would also like to note that she will be out of this medication as of this weekend.   [phone] ok to leave Vmail

## 2017-10-03 NOTE — Telephone Encounter (Signed)
Pt.notified

## 2017-10-19 ENCOUNTER — Ambulatory Visit: Payer: Medicare Other

## 2017-10-19 VITALS — BP 126/74 | HR 76

## 2017-10-19 DIAGNOSIS — I10 Essential (primary) hypertension: Secondary | ICD-10-CM

## 2017-10-19 NOTE — Progress Notes (Signed)
Pt is here for 2 week BP check since starting on Valsartan, she denies and side effects and BP today was 126/74, pulse 76. Patient was released. Information sent to Dr. Sanda Klein. If any problems, we will call, otherwise continue current regimen.

## 2017-11-11 ENCOUNTER — Other Ambulatory Visit: Payer: Self-pay | Admitting: Family Medicine

## 2017-11-11 DIAGNOSIS — E890 Postprocedural hypothyroidism: Secondary | ICD-10-CM

## 2017-11-11 NOTE — Telephone Encounter (Signed)
Lab Results  Component Value Date   TSH 1.83 05/30/2017   Synthroid requested Haley Schneider just approved a 90 day supply on 09/21/17 Too soon for refills Please resolve with pharmacy

## 2017-11-12 NOTE — Telephone Encounter (Signed)
Left voicemail with pharmacy 

## 2017-12-04 ENCOUNTER — Encounter: Payer: Self-pay | Admitting: Family Medicine

## 2017-12-04 ENCOUNTER — Ambulatory Visit (INDEPENDENT_AMBULATORY_CARE_PROVIDER_SITE_OTHER): Payer: Medicare Other

## 2017-12-04 ENCOUNTER — Ambulatory Visit (INDEPENDENT_AMBULATORY_CARE_PROVIDER_SITE_OTHER): Payer: Medicare Other | Admitting: Family Medicine

## 2017-12-04 VITALS — BP 130/80 | HR 92 | Temp 97.5°F | Resp 16 | Ht 65.0 in | Wt 146.0 lb

## 2017-12-04 VITALS — BP 130/80 | HR 92 | Temp 97.5°F | Ht 65.0 in | Wt 146.0 lb

## 2017-12-04 DIAGNOSIS — E890 Postprocedural hypothyroidism: Secondary | ICD-10-CM

## 2017-12-04 DIAGNOSIS — M810 Age-related osteoporosis without current pathological fracture: Secondary | ICD-10-CM

## 2017-12-04 DIAGNOSIS — Z0000 Encounter for general adult medical examination without abnormal findings: Secondary | ICD-10-CM

## 2017-12-04 DIAGNOSIS — E559 Vitamin D deficiency, unspecified: Secondary | ICD-10-CM

## 2017-12-04 DIAGNOSIS — Z5181 Encounter for therapeutic drug level monitoring: Secondary | ICD-10-CM

## 2017-12-04 DIAGNOSIS — I10 Essential (primary) hypertension: Secondary | ICD-10-CM | POA: Diagnosis not present

## 2017-12-04 DIAGNOSIS — E785 Hyperlipidemia, unspecified: Secondary | ICD-10-CM | POA: Insufficient documentation

## 2017-12-04 DIAGNOSIS — E782 Mixed hyperlipidemia: Secondary | ICD-10-CM | POA: Diagnosis not present

## 2017-12-04 MED ORDER — VALSARTAN 80 MG PO TABS: 80.0000 mg | ORAL_TABLET | Freq: Every day | ORAL | 10 refills | Status: DC

## 2017-12-04 NOTE — Assessment & Plan Note (Signed)
Check labs to make sure resolved after parathyroidectomy

## 2017-12-04 NOTE — Assessment & Plan Note (Signed)
Next DEXA due in June 2020; practice fall precautions; weight-bearing exercise; calcium and vit D

## 2017-12-04 NOTE — Patient Instructions (Signed)
We'll get labs today If you have not heard anything from my staff in a week about any orders/referrals/studies from today, please contact us here to follow-up [phone]  

## 2017-12-04 NOTE — Assessment & Plan Note (Signed)
Continues to lose hair; check labs and then have see dermatologist about this

## 2017-12-04 NOTE — Assessment & Plan Note (Signed)
Limit saturated fats; check lipids; she is not interested in medicine for cholesterol

## 2017-12-04 NOTE — Assessment & Plan Note (Signed)
Well-controlled today; try to follow DASH guidelines

## 2017-12-04 NOTE — Patient Instructions (Signed)
Haley Schneider , Thank you for taking time to come for your Medicare Wellness Visit. I appreciate your ongoing commitment to your health goals. Please review the following plan we discussed and let me know if I can assist you in the future.   Screening recommendations/referrals: Mammogram: done 06/15/17 Bone Density: done 06/12/16 Recommended yearly ophthalmology/optometry visit for glaucoma screening and checkup Recommended yearly dental visit for hygiene and checkup  Vaccinations: Influenza vaccine: postponed Pneumococcal vaccine: done 11/09/16 Tdap vaccine: please contact us if you get a cut or scrape Shingles vaccine: Shingrix discussed. Please contact your insurance company for coverage information.     Advanced directives: Please bring a copy of your health care power of attorney and living will to the office at your convenience.  Conditions/risks identified: Recommend increasing physical activity.   Next appointment: Please follow up in one year for your Medicare Annual Wellness visit.     Preventive Care 51 Years and Older, Female Preventive care refers to lifestyle choices and visits with your health care provider that can promote health and wellness. What does preventive care include?  A yearly physical exam. This is also called an annual well check.  Dental exams once or twice a year.  Routine eye exams. Ask your health care provider how often you should have your eyes checked.  Personal lifestyle choices, including:  Daily care of your teeth and gums.  Regular physical activity.  Eating a healthy diet.  Avoiding tobacco and drug use.  Limiting alcohol use.  Practicing safe sex.  Taking low-dose aspirin every day.  Taking vitamin and mineral supplements as recommended by your health care provider. What happens during an annual well check? The services and screenings done by your health care provider during your annual well check will depend on your age, overall  health, lifestyle risk factors, and family history of disease. Counseling  Your health care provider may ask you questions about your:  Alcohol use.  Tobacco use.  Drug use.  Emotional well-being.  Home and relationship well-being.  Sexual activity.  Eating habits.  History of falls.  Memory and ability to understand (cognition).  Work and work Statistician.  Reproductive health. Screening  You may have the following tests or measurements:  Height, weight, and BMI.  Blood pressure.  Lipid and cholesterol levels. These may be checked every 5 years, or more frequently if you are over 37 years old.  Skin check.  Lung cancer screening. You may have this screening every year starting at age 92 if you have a 30-pack-year history of smoking and currently smoke or have quit within the past 15 years.  Fecal occult blood test (FOBT) of the stool. You may have this test every year starting at age 65.  Flexible sigmoidoscopy or colonoscopy. You may have a sigmoidoscopy every 5 years or a colonoscopy every 10 years starting at age 54.  Hepatitis C blood test.  Hepatitis B blood test.  Sexually transmitted disease (STD) testing.  Diabetes screening. This is done by checking your blood sugar (glucose) after you have not eaten for a while (fasting). You may have this done every 1-3 years.  Bone density scan. This is done to screen for osteoporosis. You may have this done starting at age 29.  Mammogram. This may be done every 1-2 years. Talk to your health care provider about how often you should have regular mammograms. Talk with your health care provider about your test results, treatment options, and if necessary, the need for more  tests. Vaccines  Your health care provider may recommend certain vaccines, such as:  Influenza vaccine. This is recommended every year.  Tetanus, diphtheria, and acellular pertussis (Tdap, Td) vaccine. You may need a Td booster every 10  years.  Zoster vaccine. You may need this after age 66.  Pneumococcal 13-valent conjugate (PCV13) vaccine. One dose is recommended after age 62.  Pneumococcal polysaccharide (PPSV23) vaccine. One dose is recommended after age 22. Talk to your health care provider about which screenings and vaccines you need and how often you need them. This information is not intended to replace advice given to you by your health care provider. Make sure you discuss any questions you have with your health care provider. Document Released: 01/15/2015 Document Revised: 09/08/2015 Document Reviewed: 10/20/2014 Elsevier Interactive Patient Education  2017 [place] Prevention in the Home Falls can cause injuries. They can happen to people of all ages. There are many things you can do to make your home safe and to help prevent falls. What can I do on the outside of my home?  Regularly fix the edges of walkways and driveways and fix any cracks.  Remove anything that might make you trip as you walk through a door, such as a raised step or threshold.  Trim any bushes or trees on the path to your home.  Use bright outdoor lighting.  Clear any walking paths of anything that might make someone trip, such as rocks or tools.  Regularly check to see if handrails are loose or broken. Make sure that both sides of any steps have handrails.  Any raised decks and porches should have guardrails on the edges.  Have any leaves, snow, or ice cleared regularly.  Use sand or salt on walking paths during winter.  Clean up any spills in your garage right away. This includes oil or grease spills. What can I do in the bathroom?  Use night lights.  Install grab bars by the toilet and in the tub and shower. Do not use towel bars as grab bars.  Use non-skid mats or decals in the tub or shower.  If you need to sit down in the shower, use a plastic, non-slip stool.  Keep the floor dry. Clean up any water that  spills on the floor as soon as it happens.  Remove soap buildup in the tub or shower regularly.  Attach bath mats securely with double-sided non-slip rug tape.  Do not have throw rugs and other things on the floor that can make you trip. What can I do in the bedroom?  Use night lights.  Make sure that you have a light by your bed that is easy to reach.  Do not use any sheets or blankets that are too big for your bed. They should not hang down onto the floor.  Have a firm chair that has side arms. You can use this for support while you get dressed.  Do not have throw rugs and other things on the floor that can make you trip. What can I do in the kitchen?  Clean up any spills right away.  Avoid walking on wet floors.  Keep items that you use a lot in easy-to-reach places.  If you need to reach something above you, use a strong step stool that has a grab bar.  Keep electrical cords out of the way.  Do not use floor polish or wax that makes floors slippery. If you must use wax, use non-skid floor  wax.  Do not have throw rugs and other things on the floor that can make you trip. What can I do with my stairs?  Do not leave any items on the stairs.  Make sure that there are handrails on both sides of the stairs and use them. Fix handrails that are broken or loose. Make sure that handrails are as long as the stairways.  Check any carpeting to make sure that it is firmly attached to the stairs. Fix any carpet that is loose or worn.  Avoid having throw rugs at the top or bottom of the stairs. If you do have throw rugs, attach them to the floor with carpet tape.  Make sure that you have a light switch at the top of the stairs and the bottom of the stairs. If you do not have them, ask someone to add them for you. What else can I do to help prevent falls?  Wear shoes that:  Do not have high heels.  Have rubber bottoms.  Are comfortable and fit you well.  Are closed at the  toe. Do not wear sandals.  If you use a stepladder:  Make sure that it is fully opened. Do not climb a closed stepladder.  Make sure that both sides of the stepladder are locked into place.  Ask someone to hold it for you, if possible.  Clearly mark and make sure that you can see:  Any grab bars or handrails.  First and last steps.  Where the edge of each step is.  Use tools that help you move around (mobility aids) if they are needed. These include:  Canes.  Walkers.  Scooters.  Crutches.  Turn on the lights when you go into a dark area. Replace any light bulbs as soon as they burn out.  Set up your furniture so you have a clear path. Avoid moving your furniture around.  If any of your floors are uneven, fix them.  If there are any pets around you, be aware of where they are.  Review your medicines with your doctor. Some medicines can make you feel dizzy. This can increase your chance of falling. Ask your doctor what other things that you can do to help prevent falls. This information is not intended to replace advice given to you by your health care provider. Make sure you discuss any questions you have with your health care provider. Document Released: 10/15/2008 Document Revised: 05/27/2015 Document Reviewed: 01/23/2014 Elsevier Interactive Patient Education  2017 [employer].

## 2017-12-04 NOTE — Progress Notes (Signed)
BP 130/80   Pulse 92   Temp (!) 97.5 F (36.4 C) (Oral)   Ht 5\' 5"  (1.651 m)   Wt 146 lb (66.2 kg)   SpO2 93%   BMI 24.30 kg/m    Subjective:    Patient ID: Haley Schneider, female    DOB: Dec 13, 1935, 82 y.o.   MRN: [id number]  HPI: Haley Schneider is a 82 y.o. female  Chief Complaint  Patient presents with  . Follow-up    HPI She has had the parathyroid surgery on April 28, 2017; it went fine; he dismissed her; she hasn't been back; she will go back if calcium is abnormal No facial twitching, no calf cramping; energy level is good for her age; she stays busy  HTN; controlled today; pleased with medicines; no side effects that she knows of; no added salt; she does eat out a lot  Hypothyroidism; stools are regular; some hair loss, "I'm losing my hair and I don't know what to do"; she goes to a dermatologist  Lab Results  Component Value Date   TSH 1.83 05/30/2017   High cholesterol; she does not want to be put on medicine; she eats a lot of [employer]; more chicken that red meat; burger every two weeks; she does not really want to give up her burgers or fried chicken; she eats some cheese; not very many eggs, not 3 per week Last LDL was 118; HDL 63  Osteoporosis; getting some calcium in her diet; had DEXA in June 2018; on fosamax; good fall precautions; taking vit D  Depression screen [facility] 2/9 12/04/2017 05/30/2017 11/29/2016 03/08/2016 09/01/2015  Decreased Interest 0 0 0 0 0  Down, Depressed, Hopeless 0 0 0 0 0  PHQ - 2 Score 0 0 0 0 0  Altered sleeping 2 - - - -  Tired, decreased energy 1 - - - -  Change in appetite 0 - - - -  Feeling bad or failure about yourself  0 - - - -  Trouble concentrating 0 - - - -  Moving slowly or fidgety/restless 0 - - - -  Suicidal thoughts 0 - - - -  PHQ-9 Score 3 - - - -   Fall Risk  12/04/2017 05/30/2017 11/29/2016 03/08/2016 09/01/2015  Falls in the past year? 0 No No No Yes  Number falls in past yr: 0 - - - 1  Injury with Fall? - - -  - No    Relevant past medical, surgical, family and social history reviewed Past Medical History:  Diagnosis Date  . Arthritis   . Fibroid, uterine   . GERD (gastroesophageal reflux disease)   . History of abnormal cervical Pap smear   . History of frequent urinary tract infections   . History of urinary tract infection   . Hypercholesteremia   . Hypertension   . Menopause   . Nocturia   . OP (osteoporosis)   . Pessary maintenance    # 5 ring with support  . PONV (postoperative nausea and vomiting)    many yrs ago  . Retinal vessel occlusion   . 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 Urethral caruncle   . Urinary incontinence   . Urinary urgency   . Vaginal atrophy    Past Surgical History:  Procedure Laterality Date  . CATARACT EXTRACTION    . EYE SURGERY     cataract surgery bilat   . EYE SURGERY     laser  eye surgery left eye"retinal acclution"laser  . HERNIA REPAIR    . THYROGLOSSAL DUCT CYST    . THYROIDECTOMY    . TONSILLECTOMY    . TONSILLECTOMY    . TOTAL HIP ARTHROPLASTY     left hip 7 years ago   . TOTAL HIP ARTHROPLASTY Right 08/26/2014   Procedure: RIGHT TOTAL HIP ARTHROPLASTY ANTERIOR APPROACH;  Surgeon: Gaynelle Arabian, MD;  Location: [facility];  Service: Orthopedics;  Laterality: Right;  . TOTAL KNEE ARTHROPLASTY Left 10/25/2015   Procedure: LEFT TOTAL KNEE ARTHROPLASTY;  Surgeon: Gaynelle Arabian, MD;  Location: [facility];  Service: Orthopedics;  Laterality: Left;   Family History  Problem Relation Age of Onset  . Alzheimer's disease Mother   . Cancer Father        lung  . Hypertension Father   . Stroke Paternal Grandfather   . Tuberculosis Paternal Grandmother   . Diabetes Son   . Breast cancer Paternal Aunt   . Heart disease Neg Hx    Social History   Tobacco Use  . Smoking status: Never Smoker  . Smokeless tobacco: Never Used  Substance Use Topics  . Alcohol use: No  . Drug use: No     Interim medical history since last  visit reviewed. Allergies and medications reviewed  Review of Systems Per HPI unless specifically indicated above     Objective:    BP 130/80   Pulse 92   Temp (!) 97.5 F (36.4 C) (Oral)   Ht 5\' 5"  (1.651 m)   Wt 146 lb (66.2 kg)   SpO2 93%   BMI 24.30 kg/m   Wt Readings from Last 3 Encounters:  12/04/17 146 lb (66.2 kg)  12/04/17 146 lb (66.2 kg)  09/19/17 145 lb 3.2 oz (65.9 kg)    Physical Exam  Constitutional: She appears well-developed and well-nourished. No distress.  HENT:  Head: Normocephalic and atraumatic.  Eyes: EOM are normal. No scleral icterus.  Neck: No thyromegaly present.  Cardiovascular: Normal rate, regular rhythm and normal heart sounds.  No murmur heard. Pulmonary/Chest: Effort normal and breath sounds normal. No respiratory distress. She has no wheezes.  Abdominal: Soft. Bowel sounds are normal. She exhibits no distension.  Musculoskeletal: She exhibits no edema.  Neurological: She is alert.  Skin: Skin is warm and dry. She is not diaphoretic. No pallor.  Psychiatric: She has a normal mood and affect. Her behavior is normal. Judgment and thought content normal.    Results for orders placed or performed in visit on 92/33/00  BASIC METABOLIC PANEL WITH GFR  Result Value Ref Range   Glucose, Bld 84 65 - 99 mg/dL   BUN 9 7 - 25 mg/dL   Creat 0.67 0.60 - 0.88 mg/dL   GFR, Est Non African American 83 > OR = 60 mL/min/1.31m2   GFR, Est African American 96 > OR = 60 mL/min/1.61m2   BUN/Creatinine Ratio NOT APPLICABLE 6 - 22 (calc)   Sodium 137 135 - 146 mmol/L   Potassium 4.3 3.5 - 5.3 mmol/L   Chloride 101 98 - 110 mmol/L   CO2 27 20 - 32 mmol/L   Calcium 8.8 8.6 - 10.4 mg/dL      Assessment & Plan:   Problem List Items Addressed This Visit      Cardiovascular and Mediastinum   HTN (hypertension) - Primary    Well-controlled today; try to follow DASH guidelines      Relevant Medications   valsartan (DIOVAN) 80 MG tablet  Endocrine     Hypothyroid    Continues to lose hair; check labs and then have see dermatologist about this      Relevant Orders   TSH     Musculoskeletal and Integument   Osteoporosis    Next DEXA due in June 2020; practice fall precautions; weight-bearing exercise; calcium and vit D      Relevant Orders   VITAMIN D 25 Hydroxy (Vit-D Deficiency, Fractures)     Other   Vitamin D deficiency   Relevant Orders   VITAMIN D 25 Hydroxy (Vit-D Deficiency, Fractures)   Hyperlipidemia    Limit saturated fats; check lipids; she is not interested in medicine for cholesterol      Relevant Medications   valsartan (DIOVAN) 80 MG tablet   Other Relevant Orders   Lipid panel   Hypercalcemia    Check labs to make sure resolved after parathyroidectomy       Other Visit Diagnoses    Medication monitoring encounter       Relevant Orders   CBC with Differential/Platelet   COMPLETE METABOLIC PANEL WITH GFR       Follow up plan: Return in about 6 months (around 06/05/2018).  An after-visit summary was printed and given to the patient at [facility].  Please see the patient instructions which may contain other information and recommendations beyond what is mentioned above in the assessment and plan.  Meds ordered this encounter  Medications  . valsartan (DIOVAN) 80 MG tablet    Sig: Take 1 tablet (80 mg total) by mouth daily. For blood pressure; this replaces losartan    Dispense:  30 tablet    Refill:  10    Orders Placed This Encounter  Procedures  . CBC with Differential/Platelet  . COMPLETE METABOLIC PANEL WITH GFR  . Lipid panel  . TSH  . VITAMIN D 25 Hydroxy (Vit-D Deficiency, Fractures)

## 2017-12-04 NOTE — Progress Notes (Signed)
Subjective:   Haley Schneider is a 82 y.o. female who presents for Medicare Annual (Subsequent) preventive examination.  Review of Systems:   Cardiac Risk Factors include: advanced age (>4men, >25 women);hypertension;dyslipidemia     Objective:     Vitals: BP 130/80 (BP Location: Left Arm, Patient Position: Sitting, Cuff Size: Normal)   Pulse 92   Temp (!) 97.5 F (36.4 C) (Oral)   Resp 16   Ht 5\' 5"  (1.651 m)   Wt 146 lb (66.2 kg)   SpO2 93%   BMI 24.30 kg/m   Body mass index is 24.3 kg/m.  Advanced Directives 12/04/2017 03/08/2016 10/25/2015 10/18/2015 09/01/2015 08/26/2014 08/26/2014  Does Patient Have a Medical Advance Directive? Yes Yes Yes Yes Yes Yes -  Type of Advance Directive Living will;Healthcare Power of Attorney - Living will;Healthcare Power of Attorney Living will;Healthcare Power of Attorney Living will [place] -  Does patient want to make changes to medical advance directive? - - No - Patient declined No - Patient declined - No - Patient declined -  Copy of [place] in Chart? No - copy requested - No - copy requested No - copy requested No - copy requested No - copy requested (No Data)    Tobacco Social History   Tobacco Use  Smoking Status Never Smoker  Smokeless Tobacco Never Used     Counseling given: Not Answered   Clinical Intake:  Pre-visit preparation completed: Yes  Pain : No/denies pain     Nutritional Status: BMI of 19-24  Normal Nutritional Risks: None Diabetes: No  How often do you need to have someone help you when you read instructions, pamphlets, or other written materials from your doctor or pharmacy?: 1 - Never What is the last grade level you completed in school?: some college  Interpreter Needed?: No  Information entered by :: Clemetine Marker LPN  Past Medical History:  Diagnosis Date  . Arthritis   . Fibroid, uterine   . GERD (gastroesophageal reflux disease)   . History of  abnormal cervical Pap smear   . History of frequent urinary tract infections   . History of urinary tract infection   . Hypercholesteremia   . Hypertension   . Menopause   . Nocturia   . OP (osteoporosis)   . Pessary maintenance    # 5 ring with support  . PONV (postoperative nausea and vomiting)    many yrs ago  . Retinal vessel occlusion   . 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 Urethral caruncle   . Urinary incontinence   . Urinary urgency   . Vaginal atrophy    Past Surgical History:  Procedure Laterality Date  . CATARACT EXTRACTION    . EYE SURGERY     cataract surgery bilat   . EYE SURGERY     laser eye surgery left eye"retinal acclution"laser  . HERNIA REPAIR    . THYROGLOSSAL DUCT CYST    . THYROIDECTOMY    . TONSILLECTOMY    . TONSILLECTOMY    . TOTAL HIP ARTHROPLASTY     left hip 7 years ago   . TOTAL HIP ARTHROPLASTY Right 08/26/2014   Procedure: RIGHT TOTAL HIP ARTHROPLASTY ANTERIOR APPROACH;  Surgeon: Gaynelle Arabian, MD;  Location: [facility];  Service: Orthopedics;  Laterality: Right;  . TOTAL KNEE ARTHROPLASTY Left 10/25/2015   Procedure: LEFT TOTAL KNEE ARTHROPLASTY;  Surgeon: Gaynelle Arabian, MD;  Location: [facility];  Service: Orthopedics;  Laterality: Left;   Family History  Problem Relation Age of Onset  . Alzheimer's disease Mother   . Cancer Father        lung  . Hypertension Father   . Stroke Paternal Grandfather   . Tuberculosis Paternal Grandmother   . Diabetes Son   . Breast cancer Paternal Aunt   . Heart disease Neg Hx    Social History   Socioeconomic History  . Marital status: Widowed    Spouse name: Not on file  . Number of children: 2  . Years of education: Not on file  . Highest education level: Not on file  Occupational History  . Occupation: retired  Scientific laboratory technician  . Financial resource strain: Not hard at all  . Food insecurity:    Worry: Never true    Inability: Never true  . Transportation needs:    Medical:  No    Non-medical: No  Tobacco Use  . Smoking status: Never Smoker  . Smokeless tobacco: Never Used  Substance and Sexual Activity  . Alcohol use: No  . Drug use: No  . Sexual activity: Not on file  Lifestyle  . Physical activity:    Days per week: 0 days    Minutes per session: 0 min  . Stress: To some extent  Relationships  . Social connections:    Talks on phone: More than three times a week    Gets together: More than three times a week    Attends religious service: More than 4 times per year    Active member of club or organization: No    Attends meetings of clubs or organizations: Never    Relationship status: Widowed  Other Topics Concern  . Not on file  Social History Narrative  . Not on file    Outpatient Encounter Medications as of 12/04/2017  Medication Sig  . alendronate (FOSAMAX) 70 MG tablet TAKE 1 TABLET BY MOUTH EVERY 7 DAYS WITH A FULL GLASS OF WATER. DO NOT LIE DOWN FOR THE NEXT 30 MIN  . aspirin EC 81 MG tablet Take by mouth.  . Biotin 10 MG CAPS Take by mouth.  . cholecalciferol (VITAMIN D) 1000 units tablet Take 1,000 Units by mouth daily.  Marland Kitchen ibuprofen (ADVIL,MOTRIN) 200 MG tablet Take by mouth.  . Lutein 20 MG CAPS Take by mouth.  . Magnesium Citrate 100 MG TABS Take by mouth.  Levin Erp SULFATE VAGINAL (TRIMO-SAN) 0.025 % GEL Place vaginally.  Marland Kitchen SYNTHROID 50 MCG tablet TAKE ONE (1) TABLET BY MOUTH EVERY DAY  . tobramycin (TOBREX) 0.3 % ophthalmic solution INSTILL 1 DROP IN LEFT EYE 4X/DAY BEGIN ONE DAY PRIOR TO TREATMENT, DAY OF TREATMENT & 1 DAY AFTER  . valsartan (DIOVAN) 80 MG tablet Take 1 tablet (80 mg total) by mouth daily. For blood pressure; this replaces losartan  . vitamin A 8000 UNIT capsule Take by mouth.  . vitamin B-12 (CYANOCOBALAMIN) 1000 MCG tablet Take 1,000 mcg by mouth daily.  . traMADol (ULTRAM) 50 MG tablet Take 1-2 tablets (50-100 mg total) by mouth every 6 (six) hours as needed (mild pain). (Patient not taking: Reported on  12/04/2017)  . UNABLE TO FIND Med Name: Magnesium unknown amt and vit b complex unknown amt 3 x a week.  . [DISCONTINUED] [place] Calcium 250-125-100 MG-UNIT CAPS Take by mouth.   No facility-administered encounter medications on file as of 12/04/2017.     Activities of Daily Living In your present state of health, do you  have any difficulty performing the following activities: 12/04/2017 05/30/2017  Hearing? N N  Comment declines hearing aids -  Vision? N Y  Comment wears reading glasses -  Difficulty concentrating or making decisions? N N  Walking or climbing stairs? N N  Dressing or bathing? N N  Doing errands, shopping? N N  Preparing Food and eating ? N -  Using the Toilet? N -  In the past six months, have you accidently leaked urine? Y -  Comment wears pads for protection, also has a pessary -  Do you have problems with loss of bowel control? N -  Managing your Medications? N -  Managing your Finances? N -  Housekeeping or managing your Housekeeping? N -  Some recent data might be hidden    Patient Care Team: Lada, Satira Anis, MD as PCP - General (Family Medicine) Defrancesco, Alanda Slim, MD as Consulting Physician (Obstetrics and Gynecology) Gaynelle Arabian, MD as Consulting Physician (Orthopedic Surgery) Sherlynn Stalls, MD as Consulting Physician (Ophthalmology)    Assessment:   This is a routine wellness examination for Haley Schneider.  Exercise Activities and Dietary recommendations Current Exercise Habits: Home exercise routine(pt is active in her home and yard), Type of exercise: Other - see comments(yard work, housekeeping), Intensity: Mild, Exercise limited by: None identified  Goals    . Increase physical activity     Recommend increasing physical activity to 150 minutes per week       Fall Risk Fall Risk  12/04/2017 05/30/2017 11/29/2016 03/08/2016 09/01/2015  Falls in the past year? 0 No No No Yes  Number falls in past yr: 0 - - - 1  Injury with Fall? - - - - No   FALL  RISK PREVENTION PERTAINING TO THE HOME:  Any stairs in or around the home WITH handrails? Yes  Home free of loose throw rugs in walkways, pet beds, electrical cords, etc? No  Adequate lighting in your home to reduce risk of falls? Yes   ASSISTIVE DEVICES UTILIZED TO PREVENT FALLS:  Life alert? No  Use of a cane, walker or w/c? No  Grab bars in the bathroom? Yes  Shower chair or bench in shower? No  Elevated toilet seat or a handicapped toilet? No   DME ORDERS:  DME order needed?  No   TIMED UP AND GO:  Was the test performed? Yes .  Length of time to ambulate 10 feet: 6 sec.   GAIT:  Appearance of gait: Gait stead-fast and without the use of an assistive device.  Education: Fall risk prevention has been discussed.  Intervention(s) required? Yes  need to remove throw rugs  Depression Screen PHQ 2/9 Scores 12/04/2017 05/30/2017 11/29/2016 03/08/2016  PHQ - 2 Score 0 0 0 0  PHQ- 9 Score 3 - - -     Cognitive Function        Immunization History  Administered Date(s) Administered  . Influenza-Unspecified 12/11/2013  . Pneumococcal Conjugate-13 03/10/2015  . Pneumococcal Polysaccharide-23 11/09/2016    Qualifies for Shingles Vaccine? Yes  Zostavax completed. Pt cannot recall date. Due for Shingrix. Education has been provided regarding the importance of this vaccine. Pt has been advised to call insurance company to determine out of pocket expense. Advised may also receive vaccine at local pharmacy or Health Dept. Verbalized acceptance and understanding.  Tdap: Although this vaccine is not a covered service during a Wellness Exam, does the patient still wish to receive this vaccine today?  No .  Education has  been provided regarding the importance of this vaccine. Advised may receive this vaccine at local pharmacy or Health Dept. Aware to provide a copy of the vaccination record if obtained from local pharmacy or Health Dept. Verbalized acceptance and understanding.  Flu  Vaccine: Due for Flu vaccine. Does the patient want to receive this vaccine today?  No . Education has been provided regarding the importance of this vaccine but still declined. Advised may receive this vaccine at local pharmacy or Health Dept. Aware to provide a copy of the vaccination record if obtained from local pharmacy or Health Dept. Verbalized acceptance and understanding.  Pneumococcal Vaccine: Up to date    Screening Tests Health Maintenance  Topic Date Due  . TETANUS/TDAP  05/31/2018 (Originally 03/03/1954)  . INFLUENZA VACCINE  08/03/2018 (Originally 08/02/2017)  . MAMMOGRAM  06/16/2018  . DEXA SCAN  Completed  . PNA vac Low Risk Adult  Completed    Cancer Screenings:  Colorectal Screening: no longer required.  Mammogram: Completed 06/15/2017. Repeat every year.    Bone Density: Completed 06/12/2016. Results reflect OSTEOPOROSIS. Repeat every 2 years.  Lung Cancer Screening: (Low Dose CT Chest recommended if Age 81-80 years, 30 pack-year currently smoking OR have quit w/in 15years.) does not qualify.    Additional Screening:  Hepatitis C Screening: no longer required  Vision Screening: Recommended annual ophthalmology exams for early detection of glaucoma and other disorders of the eye. Is the patient up to date with their annual eye exam?  Yes  Who is the provider or what is the name of the office in which the pt attends annual eye exams? Dr. Baird Cancer retina specialist in [place]: Recommended annual dental exams for proper oral hygiene  Community Resource Referral:  CRR required this visit?  No      Plan:    I have personally reviewed and addressed the Medicare Annual Wellness questionnaire and have noted the following in the patient's chart:  A. Medical and social history B. Use of alcohol, tobacco or illicit drugs  C. Current medications and supplements D. Functional ability and status E.  Nutritional status F.  Physical  activity G. Advance directives H. List of other physicians I.  Hospitalizations, surgeries, and ER visits in previous 12 months J.  [place] such as hearing and vision if needed, cognitive and depression L. Referrals and appointments   In addition, I have reviewed and discussed with patient certain preventive protocols, quality metrics, and best practice recommendations. A written personalized care plan for preventive services as well as general preventive health recommendations were provided to patient.   Signed,  Clemetine Marker, LPN Nurse Health Advisor   Nurse Notes: Pt is interested in prolia injection for osteoporosis twice yearly rather than weekly fosamax, pt states she feels like the fosamax makes arthritis pain worse.

## 2017-12-05 LAB — LIPID PANEL
Cholesterol: 205 mg/dL — ABNORMAL HIGH (ref <200)
HDL: 66 mg/dL (ref >50)
LDL Cholesterol (Calc): 117 mg/dL (calc) — ABNORMAL HIGH
NON-HDL CHOLESTEROL (CALC): 139 mg/dL — AB (ref <130)
Total CHOL/HDL Ratio: 3.1 (calc) (ref <5.0)
Triglycerides: 110 mg/dL (ref <150)

## 2017-12-05 LAB — CBC WITH DIFFERENTIAL/PLATELET
BASOS ABS: 70 {cells}/uL (ref 0–200)
Basophils Relative: 0.9 %
EOS ABS: 47 {cells}/uL (ref 15–500)
EOS PCT: 0.6 %
HCT: 40.6 % (ref 35.0–45.0)
Hemoglobin: 13.7 g/dL (ref 11.7–15.5)
Lymphs Abs: 2270 cells/uL (ref 850–3900)
MCH: 28.7 pg (ref 27.0–33.0)
MCHC: 33.7 g/dL (ref 32.0–36.0)
MCV: 84.9 fL (ref 80.0–100.0)
MPV: 9.8 fL (ref 7.5–12.5)
Monocytes Relative: 6.8 %
Neutro Abs: 4883 cells/uL (ref 1500–7800)
Neutrophils Relative %: 62.6 %
PLATELETS: 335 10*3/uL (ref 140–400)
RBC: 4.78 10*6/uL (ref 3.80–5.10)
RDW: 12.9 % (ref 11.0–15.0)
TOTAL LYMPHOCYTE: 29.1 %
WBC mixed population: 530 cells/uL (ref 200–950)
WBC: 7.8 10*3/uL (ref 3.8–10.8)

## 2017-12-05 LAB — COMPLETE METABOLIC PANEL WITH GFR
AG Ratio: 1.7 (calc) (ref 1.0–2.5)
ALKALINE PHOSPHATASE (APISO): 81 U/L (ref 33–130)
ALT: 13 U/L (ref 6–29)
AST: 18 U/L (ref 10–35)
Albumin: 4.1 g/dL (ref 3.6–5.1)
BILIRUBIN TOTAL: 0.4 mg/dL (ref 0.2–1.2)
BUN: 12 mg/dL (ref 7–25)
CHLORIDE: 98 mmol/L (ref 98–110)
CO2: 27 mmol/L (ref 20–32)
Calcium: 9.3 mg/dL (ref 8.6–10.4)
Creat: 0.64 mg/dL (ref 0.60–0.88)
GFR, Est African American: 98 mL/min/{1.73_m2} (ref >=60)
GFR, Est Non African American: 84 mL/min/{1.73_m2} (ref >=60)
Globulin: 2.4 g/dL (calc) (ref 1.9–3.7)
Glucose, Bld: 75 mg/dL (ref 65–99)
Potassium: 4.4 mmol/L (ref 3.5–5.3)
Sodium: 133 mmol/L — ABNORMAL LOW (ref 135–146)
Total Protein: 6.5 g/dL (ref 6.1–8.1)

## 2017-12-05 LAB — VITAMIN D 25 HYDROXY (VIT D DEFICIENCY, FRACTURES): VIT D 25 HYDROXY: 40 ng/mL (ref 30–100)

## 2017-12-05 LAB — TSH: TSH: 0.95 mIU/L (ref 0.40–4.50)

## 2017-12-12 ENCOUNTER — Encounter: Payer: Self-pay | Admitting: Obstetrics and Gynecology

## 2017-12-12 ENCOUNTER — Ambulatory Visit: Payer: Medicare Other | Admitting: Obstetrics and Gynecology

## 2017-12-12 VITALS — BP 143/79 | HR 78 | Ht 65.0 in | Wt 146.2 lb

## 2017-12-12 DIAGNOSIS — N898 Other specified noninflammatory disorders of vagina: Secondary | ICD-10-CM

## 2017-12-12 DIAGNOSIS — N8111 Cystocele, midline: Secondary | ICD-10-CM

## 2017-12-12 DIAGNOSIS — N812 Incomplete uterovaginal prolapse: Secondary | ICD-10-CM | POA: Diagnosis not present

## 2017-12-12 DIAGNOSIS — T8389XD Other specified complication of genitourinary prosthetic devices, implants and grafts, subsequent encounter: Secondary | ICD-10-CM

## 2017-12-12 NOTE — Patient Instructions (Signed)
1.  The ring with diaphragm support pessary is removed and not reinserted today 2.  Return in 3 months for pessary maintenance with Dr. Amalia Hailey. 3.  Return as needed if prolapse symptoms worsen.

## 2017-12-12 NOTE — Progress Notes (Signed)
Chief complaint: 1.  Pessary maintenance 2.  Incomplete uterovaginal prolapse 3.  Cystocele, midline 4.  History of vaginal erosion 5.  Vaginal odor  Pessary follow-up, 11 weeks (last visit-09/19/2017 Pessary type: #5 ring with diaphragm support Medications: Trimosan gel intravaginal weekly Management type: Physician monitored Symptoms: Patient denies vaginal bleeding, vaginal discharge, vaginal, pelvic/vaginal pain. She does report increased vaginal odor.  Past medical history, past surgical history, problem list, medications, and allergies are reviewed  OBJECTIVE: BP 130/78   Pulse 76   Ht 5\' 5"  (1.651 m)   Wt 145 lb 3.2 oz (65.9 kg)   BMI 24.16 kg/m  Pleasant well-appearing female in no acute distress.  Alert and oriented.  Affect is appropriate. Abdomen: Soft, nontender without organomegaly Pelvic exam: External genitalia-normal BUS-normal Vagina-fair estrogen effect; mild posterior vaginal abrasion is present, friable; moderate odor; no discharge; cystocele is present Cervix-no lesions; no cervical motion tenderness Uterus-midplane, mobile, nontender, small size; prolapse is noted to mid vagina Adnexa-nonpalpable nontender Rectovaginal-normal external exam  PROCEDURE: #5 ring with diaphragm support pessary is removed, cleaned, and not reinserted  ASSESSMENT: 1.  Normal pessary maintenance 2.  History of incomplete uterovaginal prolapse, controlled with pessary 3.  Cystocele, midline, controlled with pessary 4.  History of vaginal erosion, with mild recurrence posteriorly today 5.  Vaginal odor, secondary to abrasion and mild bleeding  PLAN: 1.  Pessary is removed, cleaned, and not reinserted 2.  Patient is to hold Trimosan gel intravaginal weekly until next appointment 3.  Patient is to return in 3 months for next pessary maintenance evaluation-Dr. Amalia Hailey 4.  Return as needed if prolapse symptoms worsen.  A total of 15 minutes were spent face-to-face with the  patient during this encounter and over half of that time dealt with counseling and coordination of care.  Brayton Mars, MD  Note: This dictation was prepared with Dragon dictation along with smaller phrase technology. Any transcriptional errors that result from this process are unintentional.

## 2018-02-08 ENCOUNTER — Other Ambulatory Visit: Payer: Self-pay | Admitting: Family Medicine

## 2018-02-08 DIAGNOSIS — E890 Postprocedural hypothyroidism: Secondary | ICD-10-CM

## 2018-02-08 NOTE — Telephone Encounter (Signed)
Lab Results  Component Value Date   TSH 0.95 12/04/2017

## 2018-03-13 ENCOUNTER — Other Ambulatory Visit: Payer: Self-pay

## 2018-03-13 ENCOUNTER — Encounter: Payer: Self-pay | Admitting: Obstetrics and Gynecology

## 2018-03-13 ENCOUNTER — Other Ambulatory Visit (HOSPITAL_COMMUNITY)
Admission: RE | Admit: 2018-03-13 | Discharge: 2018-03-13 | Disposition: A | Discharge status: Home / Self Care | Payer: Medicare Other | Source: Ambulatory Visit | Attending: Obstetrics and Gynecology | Admitting: Obstetrics and Gynecology

## 2018-03-13 ENCOUNTER — Ambulatory Visit: Payer: Medicare Other | Admitting: Obstetrics and Gynecology

## 2018-03-13 VITALS — BP 149/79 | HR 73 | Ht 65.5 in | Wt 150.1 lb

## 2018-03-13 DIAGNOSIS — N842 Polyp of vagina: Secondary | ICD-10-CM | POA: Diagnosis not present

## 2018-03-13 NOTE — Progress Notes (Signed)
Patient comes in today to have her pessary reinserted. It has been out for 3 months. Patient would like urine checked today. She is having an odor.

## 2018-03-13 NOTE — Progress Notes (Signed)
HPI:      [name] is a 83 y.o. No obstetric history on file. who LMP was No LMP recorded. Patient is postmenopausal.  Subjective:   She presents today for pessary follow-up.  She was previously found to have an erosion in the pessary was not replaced.  Patient reports no problems except for occasional urine odor.  She denies vaginal bleeding.  She is not sexually active    Hx: The following portions of the patient's history were reviewed and updated as appropriate:             She  has a past medical history of Arthritis, Fibroid, uterine, GERD (gastroesophageal reflux disease), History of abnormal cervical Pap smear, History of frequent urinary tract infections, History of urinary tract infection, Hypercholesteremia, Hypertension, Menopause, Nocturia, OP (osteoporosis), Pessary maintenance, PONV (postoperative nausea and vomiting), Retinal vessel occlusion, Status post removal of part of parathyroid (05/02/2017), Urethral caruncle, Urinary incontinence, Urinary urgency, and Vaginal atrophy. She does not have any pertinent problems on file. She  has a past surgical history that includes Hernia repair; Tonsillectomy; Thyroidectomy; Total hip arthroplasty; Cataract extraction; Thyroglossal duct cyst; Tonsillectomy; Total hip arthroplasty (Right, 08/26/2014); Eye surgery; Eye surgery; and Total knee arthroplasty (Left, 10/25/2015). Her family history includes Alzheimer's disease in her mother; Breast cancer in her paternal aunt; Cancer in her father; Diabetes in her son; Hypertension in her father; Stroke in her paternal grandfather; Tuberculosis in her paternal grandmother. She  reports that she has never smoked. She has never used smokeless tobacco. She reports that she does not drink alcohol or use drugs. She has a current medication list which includes the following prescription(s): alendronate, aspirin ec, biotin, cholecalciferol, ibuprofen, lutein, magnesium citrate, oxyquinolone sulfate  vaginal, synthroid, tobramycin, UNABLE TO FIND, valsartan, vitamin a, and vitamin b-12. She has No Known Allergies.       Review of Systems:  Review of Systems  Constitutional: Denied constitutional symptoms, night sweats, recent illness, fatigue, fever, insomnia and weight loss.  Eyes: Denied eye symptoms, eye pain, photophobia, vision change and visual disturbance.  Ears/Nose/Throat/Neck: Denied ear, nose, throat or neck symptoms, hearing loss, nasal discharge, sinus congestion and sore throat.  Cardiovascular: Denied cardiovascular symptoms, arrhythmia, chest pain/pressure, edema, exercise intolerance, orthopnea and palpitations.  Respiratory: Denied pulmonary symptoms, asthma, pleuritic pain, productive sputum, cough, dyspnea and wheezing.  Gastrointestinal: Denied, gastro-esophageal reflux, melena, nausea and vomiting.  Genitourinary: Denied genitourinary symptoms including symptomatic vaginal discharge, pelvic relaxation issues, and urinary complaints.  Musculoskeletal: Denied musculoskeletal symptoms, stiffness, swelling, muscle weakness and myalgia.  Dermatologic: Denied dermatology symptoms, rash and scar.  Neurologic: Denied neurology symptoms, dizziness, headache, neck pain and syncope.  Psychiatric: Denied psychiatric symptoms, anxiety and depression.  Endocrine: Denied endocrine symptoms including hot flashes and night sweats.   Meds:   Current Outpatient Medications on File Prior to Visit  Medication Sig Dispense Refill  . alendronate (FOSAMAX) 70 MG tablet TAKE 1 TABLET BY MOUTH EVERY 7 DAYS WITH A FULL GLASS OF WATER. DO NOT LIE DOWN FOR THE NEXT 30 MIN  3  . aspirin EC 81 MG tablet Take by mouth.    . Biotin 10 MG CAPS Take by mouth.    . cholecalciferol (VITAMIN D) 1000 units tablet Take 1,000 Units by mouth daily.    Marland Kitchen ibuprofen (ADVIL,MOTRIN) 200 MG tablet Take by mouth.    . Lutein 20 MG CAPS Take by mouth.    . Magnesium Citrate 100 MG TABS Take by mouth.    Marland Kitchen  OXYQUINOLONE SULFATE VAGINAL (TRIMO-SAN) 0.025 % GEL Place vaginally.    Marland Kitchen SYNTHROID 50 MCG tablet TAKE ONE (1) TABLET BY MOUTH EVERY DAY 90 tablet 2  . tobramycin (TOBREX) 0.3 % ophthalmic solution INSTILL 1 DROP IN LEFT EYE 4X/DAY BEGIN ONE DAY PRIOR TO TREATMENT, DAY OF TREATMENT & 1 DAY AFTER  5  . UNABLE TO FIND Med Name: Magnesium unknown amt and vit b complex unknown amt 3 x a week.    . valsartan (DIOVAN) 80 MG tablet Take 1 tablet (80 mg total) by mouth daily. For blood pressure; this replaces losartan 30 tablet 10  . vitamin A 8000 UNIT capsule Take by mouth.    . vitamin B-12 (CYANOCOBALAMIN) 1000 MCG tablet Take 1,000 mcg by mouth daily.     No current facility-administered medications on file prior to visit.     Objective:     Vitals:   03/13/18 1328  BP: (!) 149/79  Pulse: 73              Physical examination   Pelvic:   Vulva: Normal appearance.  No lesions.  Vagina:  A polypoid structure growing from the wall of the vagina possibly granulation tissue possible vaginal type polyp to the right of the cervix noted.  Very friable.  Support:  Pelvic relaxation with fourth degree cystocele  Urethra No masses tenderness or scarring.  Meatus Normal size without lesions or prolapse.  Cervix: Normal appearance.  No lesions.  Stenosis noted  Anus: Normal exam.  No lesions.  Perineum: Normal exam.  No lesions.        Bimanual   Uterus: Normal size.  Non-tender.  Mobile.  AV.  Adnexae: No masses.  Non-tender to palpation.  Cul-de-sac: Negative for abnormality.   Vaginal polypoid structure noted and biopsied using [place].  Hemostasis obtained with silver nitrate.  Patient tolerated this well.  Assessment:    No obstetric history on file. Patient Active Problem List   Diagnosis Date Noted  . Vaginal odor 12/12/2017  . Hyperlipidemia 12/04/2017  . Incomplete uterovaginal prolapse 05/03/2016  . Vitamin D deficiency 03/31/2016  . OA (osteoarthritis) of knee 10/25/2015  .  Nocturia 06/02/2015  . Cystocele, midline 06/02/2015  . Breast cancer screening 03/10/2015  . OA (osteoarthritis) of hip 08/26/2014  . Hypercalcemia 07/06/2014  . Vaginal erosion secondary to pessary use ([place]) 07/01/2014  . Hypothyroid 04/10/2013  . HTN (hypertension) 04/10/2013  . Arthritis 04/10/2013  . Osteoporosis 04/10/2013     1. Vaginal polyp     This is likely the source of blood in her urine.  This may also be the "erosion that was previously noted by Dr. Enzo Bi.   Plan:            1.  Await biopsy results  2.  Possibly granulation tissue from repair of the erosion-consider sclerosis with silver nitrate if this is the case  3.  Pessary to remain out until diagnosis treatment and healing of vaginal wall.  4.  Urine for C&S just to be sure she does not have a UTI. Orders No orders of the defined types were placed in this encounter.   No orders of the defined types were placed in this encounter.     F/U  Return for We will contact her with any abnormal test results. I spent 18 minutes involved in the care of this patient of which greater than 50% was spent discussing pessary, vaginal erosion, granulation tissue, vaginal polyp, future treatment.  All  questions answered  Finis Bud, M.D. 03/13/2018 2:05 PM

## 2018-03-13 NOTE — Addendum Note (Signed)
Addended by: Durwin Glaze on: 03/13/2018 02:31 PM   Modules accepted: Orders

## 2018-03-15 LAB — URINE CULTURE

## 2018-03-18 ENCOUNTER — Other Ambulatory Visit: Payer: Self-pay | Admitting: Surgical

## 2018-03-18 MED ORDER — NITROFURANTOIN MONOHYD MACRO 100 MG PO CAPS: 100.0000 mg | ORAL_CAPSULE | Freq: Two times a day (BID) | ORAL | 0 refills | Status: DC

## 2018-04-03 ENCOUNTER — Telehealth: Payer: Self-pay | Admitting: Obstetrics and Gynecology

## 2018-04-03 NOTE — Telephone Encounter (Signed)
The patient called and stated that she would like to speak with her nurse today if possible in regards to her having some confusion about her future treatment with Dr. Amalia Hailey. Please advise.

## 2018-04-03 NOTE — Telephone Encounter (Signed)
Phone is busy

## 2018-04-05 MED ORDER — NITROFURANTOIN MONOHYD MACRO 100 MG PO CAPS: 100.0000 mg | ORAL_CAPSULE | Freq: Two times a day (BID) | ORAL | 0 refills | Status: DC

## 2018-04-05 NOTE — Telephone Encounter (Signed)
Spoke with patient and gave her the results per Dr. Amalia Hailey that biopsy was normal. I have scheduled patient to come back in June to reinsert her pessary. Patient requested a refill of medication for UTI. She states that urine still has odor. Dr. Amalia Hailey approved refill of ABX. I have notified patient that refill has been sent to pharmacy.

## 2018-04-25 ENCOUNTER — Encounter: Payer: Self-pay | Admitting: Family Medicine

## 2018-06-06 ENCOUNTER — Ambulatory Visit: Payer: Medicare Other | Admitting: Family Medicine

## 2018-06-06 ENCOUNTER — Encounter: Payer: Self-pay | Admitting: Family Medicine

## 2018-06-06 ENCOUNTER — Other Ambulatory Visit: Payer: Self-pay

## 2018-06-06 VITALS — BP 138/78 | HR 76 | Temp 98.1°F | Resp 14 | Ht 66.0 in | Wt 149.1 lb

## 2018-06-06 DIAGNOSIS — E559 Vitamin D deficiency, unspecified: Secondary | ICD-10-CM

## 2018-06-06 DIAGNOSIS — M810 Age-related osteoporosis without current pathological fracture: Secondary | ICD-10-CM

## 2018-06-06 DIAGNOSIS — E782 Mixed hyperlipidemia: Secondary | ICD-10-CM

## 2018-06-06 DIAGNOSIS — I10 Essential (primary) hypertension: Secondary | ICD-10-CM

## 2018-06-06 DIAGNOSIS — E890 Postprocedural hypothyroidism: Secondary | ICD-10-CM

## 2018-06-06 MED ORDER — VALSARTAN 80 MG PO TABS: 80.0000 mg | ORAL_TABLET | Freq: Every day | ORAL | 5 refills | Status: DC

## 2018-06-06 MED ORDER — SYNTHROID 50 MCG PO TABS: ORAL_TABLET | ORAL | 5 refills | Status: DC

## 2018-06-06 NOTE — Progress Notes (Signed)
Erroneous

## 2018-06-06 NOTE — Progress Notes (Signed)
Name: Haley Schneider   MRN: [id number]    DOB: 09-25-1935   Date:06/06/2018       Progress Note  Subjective  Chief Complaint  Chief Complaint  Patient presents with  . Follow-up    6 month recheck  . Hypertension  . Hypothyroidism    HPI  Hypothyroidism: History: hypothyroidism Current Medication Regimen: 37mcg synthroid - stable for many years Current Symptoms: denies fatigue, weight changes, heat/cold intolerance, bowel/skin changes or CVS symptoms Most recent results are below; we will not be repeating labs today. Lab Results  Component Value Date   TSH 0.95 12/04/2017   HTN: She is at goal today.  Taking Valsartan and doing well on this.  Denies chest pain, shortness of breath, BLE edema except when it is very hot; no severe headaches.  Vision change sees retina specialist for history of retinal closure.   Osteoporosis/Vitamin D deficiency:  She took herself off of the fossamax because the pills were too hard to swallow.  She is taking vitamin D supplement.  She stopped seeing Dr. Gabriel Carina and does not want to go back.  Discussed safety and avoiding falls.   HLD: Does not want to take statin therapy. Taking aspirin daily.  Denies chest pain or shortness of breath.  Hypercalcemia/Parathyroid Hormone Elevation: Saw Dr. Gabriel Carina and had parathyroid removed in 2019.  Calcium had returned to normal after that.  She does not want to have labs done today.  Patient Active Problem List   Diagnosis Date Noted  . Vaginal odor 12/12/2017  . Hyperlipidemia 12/04/2017  . Incomplete uterovaginal prolapse 05/03/2016  . Vitamin D deficiency 03/31/2016  . OA (osteoarthritis) of knee 10/25/2015  . Nocturia 06/02/2015  . Cystocele, midline 06/02/2015  . Breast cancer screening 03/10/2015  . OA (osteoarthritis) of hip 08/26/2014  . Hypercalcemia 07/06/2014  . Vaginal erosion secondary to pessary use ([place]) 07/01/2014  . Hypothyroid 04/10/2013  . HTN (hypertension) 04/10/2013  . Arthritis  04/10/2013  . Osteoporosis 04/10/2013    Past Surgical History:  Procedure Laterality Date  . CATARACT EXTRACTION    . EYE SURGERY     cataract surgery bilat   . EYE SURGERY     laser eye surgery left eye"retinal acclution"laser  . HERNIA REPAIR    . THYROGLOSSAL DUCT CYST    . THYROIDECTOMY    . TONSILLECTOMY    . TONSILLECTOMY    . TOTAL HIP ARTHROPLASTY     left hip 7 years ago   . TOTAL HIP ARTHROPLASTY Right 08/26/2014   Procedure: RIGHT TOTAL HIP ARTHROPLASTY ANTERIOR APPROACH;  Surgeon: Gaynelle Arabian, MD;  Location: [facility];  Service: Orthopedics;  Laterality: Right;  . TOTAL KNEE ARTHROPLASTY Left 10/25/2015   Procedure: LEFT TOTAL KNEE ARTHROPLASTY;  Surgeon: Gaynelle Arabian, MD;  Location: [facility];  Service: Orthopedics;  Laterality: Left;    Family History  Problem Relation Age of Onset  . Alzheimer's disease Mother   . Cancer Father        lung  . Hypertension Father   . Stroke Paternal Grandfather   . Tuberculosis Paternal Grandmother   . Diabetes Son   . Breast cancer Paternal Aunt   . Heart disease Neg Hx     Social History   Socioeconomic History  . Marital status: Widowed    Spouse name: Not on file  . Number of children: 2  . Years of education: Not on file  . Highest education level: Not on file  Occupational History  .  Occupation: retired  Scientific laboratory technician  . Financial resource strain: Not hard at all  . Food insecurity:    Worry: Never true    Inability: Never true  . Transportation needs:    Medical: No    Non-medical: No  Tobacco Use  . Smoking status: Never Smoker  . Smokeless tobacco: Never Used  Substance and Sexual Activity  . Alcohol use: No  . Drug use: No  . Sexual activity: Not on file  Lifestyle  . Physical activity:    Days per week: 0 days    Minutes per session: 0 min  . Stress: To some extent  Relationships  . Social connections:    Talks on phone: More than three times a week    Gets together: More than three times a  week    Attends religious service: More than 4 times per year    Active member of club or organization: No    Attends meetings of clubs or organizations: Never    Relationship status: Widowed  . Intimate partner violence:    Fear of current or ex partner: No    Emotionally abused: No    Physically abused: No    Forced sexual activity: No  Other Topics Concern  . Not on file  Social History Narrative  . Not on file     Current Outpatient Medications:  .  aspirin EC 81 MG tablet, Take by mouth., Disp: , Rfl:  .  Biotin 10 MG CAPS, Take by mouth., Disp: , Rfl:  .  cholecalciferol (VITAMIN D) 1000 units tablet, Take 1,000 Units by mouth daily., Disp: , Rfl:  .  ibuprofen (ADVIL,MOTRIN) 200 MG tablet, Take by mouth., Disp: , Rfl:  .  Lutein 20 MG CAPS, Take by mouth., Disp: , Rfl:  .  Magnesium Citrate 100 MG TABS, Take by mouth., Disp: , Rfl:  .  SYNTHROID 50 MCG tablet, TAKE ONE (1) TABLET BY MOUTH EVERY DAY, Disp: 90 tablet, Rfl: 2 .  tobramycin (TOBREX) 0.3 % ophthalmic solution, INSTILL 1 DROP IN LEFT EYE 4X/DAY BEGIN ONE DAY PRIOR TO TREATMENT, DAY OF TREATMENT & 1 DAY AFTER, Disp: , Rfl: 5 .  UNABLE TO FIND, Med Name: Magnesium unknown amt and vit b complex unknown amt 3 x a week., Disp: , Rfl:  .  valsartan (DIOVAN) 80 MG tablet, Take 1 tablet (80 mg total) by mouth daily. For blood pressure; this replaces losartan, Disp: 30 tablet, Rfl: 10 .  vitamin A 8000 UNIT capsule, Take by mouth., Disp: , Rfl:  .  vitamin B-12 (CYANOCOBALAMIN) 1000 MCG tablet, Take 1,000 mcg by mouth daily., Disp: , Rfl:  .  alendronate (FOSAMAX) 70 MG tablet, TAKE 1 TABLET BY MOUTH EVERY 7 DAYS WITH A FULL GLASS OF WATER. DO NOT LIE DOWN FOR THE NEXT 30 MIN, Disp: , Rfl: 3 .  nitrofurantoin, macrocrystal-monohydrate, (MACROBID) 100 MG capsule, Take 1 capsule (100 mg total) by mouth 2 (two) times daily. (Patient not taking: Reported on 06/06/2018), Disp: 14 capsule, Rfl: 0 .  OXYQUINOLONE SULFATE VAGINAL  (TRIMO-SAN) 0.025 % GEL, Place vaginally., Disp: , Rfl:   No Known Allergies  I personally reviewed active problem list, medication list, allergies, notes from last encounter, lab results with the patient/caregiver today.   ROS Constitutional: Negative for fever or weight change.  Respiratory: Negative for cough and shortness of breath.   Cardiovascular: Negative for chest pain or palpitations.  Gastrointestinal: Negative for abdominal pain, no bowel changes.  Musculoskeletal: Negative for gait problem or joint swelling.  Skin: Negative for rash.  Neurological: Negative for dizziness or headache.  No other specific complaints in a complete review of systems (except as listed in HPI above)  Objective  Vitals:   06/06/18 0907  BP: 138/78  Pulse: 76  Resp: 14  Temp: 98.1 F (36.7 C)  TempSrc: Oral  SpO2: 96%  Weight: 149 lb 1.6 oz (67.6 kg)  Height: 5\' 6"  (1.676 m)   Body mass index is 24.07 kg/m.  Physical Exam Constitutional: Patient appears well-developed and well-nourished. No distress.  HENT: Head: Normocephalic and atraumatic. Eyes: Conjunctivae and EOM are normal. No scleral icterus.  Neck: Normal range of motion. Neck supple. No JVD present. No thyromegaly present.  Cardiovascular: Normal rate, regular rhythm and normal heart sounds.  No murmur heard. No BLE edema. Pulmonary/Chest: Effort normal and breath sounds normal. No respiratory distress. Musculoskeletal: Normal range of motion, no joint effusions. No gross deformities Neurological: Pt is alert and oriented to person, place, and time. No cranial nerve deficit. Coordination, balance, strength, speech and gait are normal.  Skin: Skin is warm and dry. No rash noted. No erythema.  Psychiatric: Patient has a normal mood and affect. behavior is normal. Judgment and thought content normal.  No results found for this or any previous visit (from the past 72 hour(s)).  PHQ2/9: Depression screen [facility] 2/9 06/06/2018  12/04/2017 05/30/2017 11/29/2016 03/08/2016  Decreased Interest 0 0 0 0 0  Down, Depressed, Hopeless 0 0 0 0 0  PHQ - 2 Score 0 0 0 0 0  Altered sleeping 0 2 - - -  Tired, decreased energy 0 1 - - -  Change in appetite 0 0 - - -  Feeling bad or failure about yourself  0 0 - - -  Trouble concentrating 0 0 - - -  Moving slowly or fidgety/restless 0 0 - - -  Suicidal thoughts 0 0 - - -  PHQ-9 Score 0 3 - - -  Difficult doing work/chores Not difficult at all - - - -   PHQ-2/9 Result is negative.    Fall Risk: Fall Risk  06/06/2018 12/04/2017 05/30/2017 11/29/2016 03/08/2016  Falls in the past year? 0 0 No No No  Number falls in past yr: 0 0 - - -  Injury with Fall? 0 - - - -  Follow up Falls evaluation completed - - - -    Assessment & Plan  1. Postoperative hypothyroidism - Stable for many years on current dose. - SYNTHROID 50 MCG tablet; Take 1 tablet once daily  Dispense: 30 tablet; Refill: 5  2. Essential hypertension - At goal today, doing well on Valsartan.  3. Age-related osteoporosis without current pathological fracture - DG Bone Density; Future  4. Vitamin D deficiency - Continue with supplement.   5. Mixed hyperlipidemia - Decline statin, which is appropriate for her age.  May continue aspirin daily.  6. Hypercalcemia - Back to normal at last labs, declines labs at this visit.  Plan to recheck at next visit.

## 2018-06-12 ENCOUNTER — Other Ambulatory Visit: Payer: Self-pay

## 2018-06-12 ENCOUNTER — Encounter: Payer: Self-pay | Admitting: Obstetrics and Gynecology

## 2018-06-12 ENCOUNTER — Ambulatory Visit: Payer: Medicare Other | Admitting: Obstetrics and Gynecology

## 2018-06-12 VITALS — BP 144/88 | HR 89 | Ht 66.0 in | Wt 151.1 lb

## 2018-06-12 DIAGNOSIS — T8389XD Other specified complication of genitourinary prosthetic devices, implants and grafts, subsequent encounter: Secondary | ICD-10-CM

## 2018-06-12 DIAGNOSIS — N898 Other specified noninflammatory disorders of vagina: Secondary | ICD-10-CM | POA: Diagnosis not present

## 2018-06-12 DIAGNOSIS — N8111 Cystocele, midline: Secondary | ICD-10-CM

## 2018-06-12 DIAGNOSIS — Z4689 Encounter for fitting and adjustment of other specified devices: Secondary | ICD-10-CM | POA: Diagnosis not present

## 2018-06-12 MED ORDER — ESTROGENS, CONJUGATED 0.625 MG/GM VA CREA: TOPICAL_CREAM | VAGINAL | 1 refills | Status: DC

## 2018-06-12 NOTE — Progress Notes (Signed)
HPI:      [name] is a 83 y.o. No obstetric history on file. who LMP was No LMP recorded. Patient is postmenopausal.  Subjective:   She presents today for follow-up of vaginal erosion/vaginal polyp/pessary care. At her last visit she was found to have a vaginal erosion with a polyp.  Biopsy of the polyp revealed what appears to be granulation tissue likely secondary to vaginal erosion healing.  She was advised to keep the pessary out until the vaginal erosion healed. She presents today to have the pessary reinserted and for evaluation of the possible erosion. She has not been using vaginal estrogen or triamcinolone cream.    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 She does not have any pertinent problems on file. She  has a past surgical history that includes Hernia repair; Tonsillectomy; Thyroidectomy; Total hip arthroplasty; Cataract extraction; Thyroglossal duct cyst; Tonsillectomy; Total hip arthroplasty (Right, 08/26/2014); Eye surgery; Eye surgery; and Total knee arthroplasty (Left, 10/25/2015). Her family history includes Alzheimer's disease in her mother; Breast cancer in her paternal aunt; Cancer in her father; Diabetes in her son; Hypertension in her father; Stroke in her paternal grandfather; Tuberculosis in her paternal grandmother. She  reports that she has never smoked. She has never used  smokeless tobacco. She reports that she does not drink alcohol or use drugs. She has a current medication list which includes the following prescription(s): aspirin ec, biotin, cholecalciferol, ibuprofen, lutein, magnesium citrate, oxyquinolone sulfate vaginal, synthroid, tobramycin, UNABLE TO FIND, valsartan, vitamin a, vitamin b-12, and conjugated estrogens. She has No Known Allergies.       Review of Systems:  Review of Systems  Constitutional: Denied constitutional symptoms, night sweats, recent illness, fatigue, fever, insomnia and weight loss.  Eyes: Denied eye symptoms, eye pain, photophobia, vision change and visual disturbance.  Ears/Nose/Throat/Neck: Denied ear, nose, throat or neck symptoms, hearing loss, nasal discharge, sinus congestion and sore throat.  Cardiovascular: Denied cardiovascular symptoms, arrhythmia, chest pain/pressure, edema, exercise intolerance, orthopnea and palpitations.  Respiratory: Denied pulmonary symptoms, asthma, pleuritic pain, productive sputum, cough, dyspnea and wheezing.  Gastrointestinal: Denied, gastro-esophageal reflux, melena, nausea and vomiting.  Genitourinary: Denied genitourinary symptoms including symptomatic vaginal discharge, pelvic relaxation issues, and urinary complaints.  Musculoskeletal: Denied musculoskeletal symptoms, stiffness, swelling, muscle weakness and myalgia.  Dermatologic: Denied dermatology symptoms, rash and scar.  Neurologic: Denied neurology symptoms, dizziness, headache, neck pain and syncope.  Psychiatric: Denied psychiatric symptoms, anxiety and depression.  Endocrine: Denied endocrine symptoms including hot flashes and night sweats.   Meds:   Current Outpatient Medications on File Prior to Visit  Medication Sig Dispense Refill  . aspirin EC 81 MG tablet Take by mouth.    . Biotin 10 MG CAPS Take by mouth.    . cholecalciferol (VITAMIN D) 1000 units tablet Take 1,000 Units by mouth daily.    Marland Kitchen ibuprofen  (ADVIL,MOTRIN) 200 MG tablet Take by mouth.    . Lutein 20 MG CAPS Take by mouth.    Marland Kitchen  Magnesium Citrate 100 MG TABS Take by mouth.    Levin Erp SULFATE VAGINAL (TRIMO-SAN) 0.025 % GEL Place vaginally.    Marland Kitchen SYNTHROID 50 MCG tablet Take 1 tablet once daily 30 tablet 5  . tobramycin (TOBREX) 0.3 % ophthalmic solution INSTILL 1 DROP IN LEFT EYE 4X/DAY BEGIN ONE DAY PRIOR TO TREATMENT, DAY OF TREATMENT & 1 DAY AFTER  5  . UNABLE TO FIND Med Name: Magnesium unknown amt and vit b complex unknown amt 3 x a week.    . valsartan (DIOVAN) 80 MG tablet Take 1 tablet (80 mg total) by mouth daily. For blood pressure; this replaces losartan 30 tablet 5  . vitamin A 8000 UNIT capsule Take by mouth.    . vitamin B-12 (CYANOCOBALAMIN) 1000 MCG tablet Take 1,000 mcg by mouth daily.     No current facility-administered medications on file prior to visit.     Objective:     Vitals:   06/12/18 1028  BP: (!) 144/88  Pulse: 89    Physical examination   Pelvic:   Vulva: Normal appearance.  No lesions.  Vagina: No lesions or abnormalities noted.  Support:  3-fourth degree cystocele  Urethra No masses tenderness or scarring.  Meatus Normal size without lesions or prolapse.  Cervix:   Anus: Normal exam.  No lesions.  Perineum: Normal exam.  No lesions.        Bimanual   Uterus:   Adnexae: No masses.  Non-tender to palpation.  Cul-de-sac: Negative for abnormality.             Speculum examination reveals no evidence of erosion or polyp.  Pessary reinserted  Assessment:    No obstetric history on file. Patient Active Problem List   Diagnosis Date Noted  . Vaginal odor 12/12/2017  . Hyperlipidemia 12/04/2017  . Incomplete uterovaginal prolapse 05/03/2016  . Vitamin D deficiency 03/31/2016  . OA (osteoarthritis) of knee 10/25/2015  . Nocturia 06/02/2015  . Cystocele, midline 06/02/2015  . Breast cancer screening 03/10/2015  . OA (osteoarthritis) of hip 08/26/2014  . Hypercalcemia  07/06/2014  . Vaginal erosion secondary to pessary use ([place]) 07/01/2014  . Hypothyroid 04/10/2013  . HTN (hypertension) 04/10/2013  . Arthritis 04/10/2013  . Osteoporosis 04/10/2013     1. 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 Cystocele, midline   3. Pessary maintenance     No evidence of vaginal erosion.  Pessary reinserted   Plan:            1.  Patient vies to use Premarin vaginal cream twice weekly-continue triamcinolone cream. Orders No orders of the defined types were placed in this encounter.    Meds ordered this encounter  Medications  . conjugated estrogens (PREMARIN) vaginal cream    Sig: 1 gram intravaginal BIW  (1/3 applicator)    Dispense:  60 g    Refill:  1      F/U  Return in about 3 months (around 09/12/2018). I spent 18 minutes involved in the care of this patient of which greater than 50% was spent discussing pessary care, use of Premarin cream, use of triamcinolone cream, cystocele and bladder infections, follow-up visits.  All questions answered.  Finis Bud, M.D. 06/12/2018 10:54 AM

## 2018-06-12 NOTE — Progress Notes (Signed)
Patient comes in today for pessary insertion. No concerns today.

## 2018-08-06 ENCOUNTER — Other Ambulatory Visit: Payer: Self-pay | Admitting: Surgical

## 2018-08-06 ENCOUNTER — Telehealth: Payer: Self-pay | Admitting: Obstetrics and Gynecology

## 2018-08-06 MED ORDER — NITROFURANTOIN MONOHYD MACRO 100 MG PO CAPS: 100.0000 mg | ORAL_CAPSULE | Freq: Two times a day (BID) | ORAL | 0 refills | Status: DC

## 2018-08-06 NOTE — Telephone Encounter (Signed)
Spoke with patient and she stated that she has a UTI. She states that her urine has an odor to it and that she always has UTI when this happens. She would like something called in. She did a urine test that you get at the drug store and it shows leukocytes on it. Please advise on prescription. She said that she really does not want to come in due to the virus.

## 2018-08-06 NOTE — Telephone Encounter (Signed)
Patient called and stated that she needs to speak with Dr. Amalia Hailey nurse in regards to her needing a call back today if possible for a urinary tract infection. The patient stated that she called yesterday and did not receive a call back but does not remember who she spoke with. Please advise.

## 2018-08-06 NOTE — Telephone Encounter (Signed)
Patient brought in urine sample that showed moderate Leuks and blood in the urine. Per Dr. Amalia Hailey sent in ABX to the pharmacy. LM for patient on voicemail that prescription has been sent to the pharmacy.

## 2018-09-11 ENCOUNTER — Other Ambulatory Visit: Payer: Self-pay

## 2018-09-11 ENCOUNTER — Encounter: Payer: Self-pay | Admitting: Obstetrics and Gynecology

## 2018-09-11 ENCOUNTER — Ambulatory Visit: Payer: Medicare Other | Admitting: Obstetrics and Gynecology

## 2018-09-11 VITALS — BP 136/77 | HR 71 | Ht 66.0 in | Wt 152.2 lb

## 2018-09-11 DIAGNOSIS — N760 Acute vaginitis: Secondary | ICD-10-CM

## 2018-09-11 DIAGNOSIS — Z4689 Encounter for fitting and adjustment of other specified devices: Secondary | ICD-10-CM | POA: Diagnosis not present

## 2018-09-11 DIAGNOSIS — B9689 Other specified bacterial agents as the cause of diseases classified elsewhere: Secondary | ICD-10-CM

## 2018-09-11 MED ORDER — METRONIDAZOLE 500 MG PO TABS: 500.0000 mg | ORAL_TABLET | Freq: Two times a day (BID) | ORAL | 0 refills | Status: AC

## 2018-09-11 NOTE — Progress Notes (Signed)
HPI:      [name] is a 83 y.o. No obstetric history on file. who LMP was No LMP recorded. Patient is postmenopausal.  Subjective:   She presents today for pessary care.  She reports that she has a vaginal odor with some discharge.  Denies itching and burning.  She states that she tried to get the Premarin/estrogen cream from her pharmacy and it was too expensive so she decided not to use it.(Search on good Rx shows that it is approximately $80 per month) patient not willing to pay this price. She continues to use the triamcinolone cream 1 time per week as directed. She denies any vaginal bleeding.    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 She does not have any pertinent problems on file. She  has a past surgical history that includes Hernia repair; Tonsillectomy; Thyroidectomy; Total hip arthroplasty; Cataract extraction; Thyroglossal duct cyst; Tonsillectomy; Total hip arthroplasty (Right, 08/26/2014); Eye surgery; Eye surgery; and Total knee arthroplasty (Left, 10/25/2015). Her family history includes Alzheimer's disease in her mother; Breast cancer in her paternal aunt; Cancer in her father; Diabetes in her son; Hypertension in her father; Stroke in her paternal grandfather; Tuberculosis in her paternal grandmother. She  reports that she has never smoked. She has never used smokeless tobacco. She reports  that she does not drink alcohol or use drugs. She has a current medication list which includes the following prescription(s): aspirin ec, biotin, cholecalciferol, conjugated estrogens, ibuprofen, lutein, magnesium citrate, oxyquinolone sulfate vaginal, synthroid, tobramycin, UNABLE TO FIND, valsartan, vitamin a, vitamin b-12, and metronidazole. She has No Known Allergies.       Review of Systems:  Review of Systems  Constitutional: Denied constitutional symptoms, night sweats, recent illness, fatigue, fever, insomnia and weight loss.  Eyes: Denied eye symptoms, eye pain, photophobia, vision change and visual disturbance.  Ears/Nose/Throat/Neck: Denied ear, nose, throat or neck symptoms, hearing loss, nasal discharge, sinus congestion and sore throat.  Cardiovascular: Denied cardiovascular symptoms, arrhythmia, chest pain/pressure, edema, exercise intolerance, orthopnea and palpitations.  Respiratory: Denied pulmonary symptoms, asthma, pleuritic pain, productive sputum, cough, dyspnea and wheezing.  Gastrointestinal: Denied, gastro-esophageal reflux, melena, nausea and vomiting.  Genitourinary: Denied genitourinary symptoms including symptomatic vaginal discharge, pelvic relaxation issues, and urinary complaints.  Musculoskeletal: Denied musculoskeletal symptoms, stiffness, swelling, muscle weakness and myalgia.  Dermatologic: Denied dermatology symptoms, rash and scar.  Neurologic: Denied neurology symptoms, dizziness, headache, neck pain and syncope.  Psychiatric: Denied psychiatric symptoms, anxiety and depression.  Endocrine: Denied endocrine symptoms including hot flashes and night sweats.   Meds:   Current Outpatient Medications on File Prior to Visit  Medication Sig Dispense Refill  . aspirin EC 81 MG tablet Take by mouth.    . Biotin 10 MG CAPS Take by mouth.    . cholecalciferol (VITAMIN D) 1000 units tablet Take 1,000 Units by mouth daily.    Marland Kitchen conjugated estrogens (PREMARIN)  vaginal cream 1 gram intravaginal BIW  (1/3 applicator) 60 g 1  . ibuprofen (ADVIL,MOTRIN)  200 MG tablet Take by mouth.    . Lutein 20 MG CAPS Take by mouth.    . Magnesium Citrate 100 MG TABS Take by mouth.    Levin Erp SULFATE VAGINAL (TRIMO-SAN) 0.025 % GEL Place vaginally.    Marland Kitchen SYNTHROID 50 MCG tablet Take 1 tablet once daily 30 tablet 5  . tobramycin (TOBREX) 0.3 % ophthalmic solution INSTILL 1 DROP IN LEFT EYE 4X/DAY BEGIN ONE DAY PRIOR TO TREATMENT, DAY OF TREATMENT & 1 DAY AFTER  5  . UNABLE TO FIND Med Name: Magnesium unknown amt and vit b complex unknown amt 3 x a week.    . valsartan (DIOVAN) 80 MG tablet Take 1 tablet (80 mg total) by mouth daily. For blood pressure; this replaces losartan 30 tablet 5  . vitamin A 8000 UNIT capsule Take by mouth.    . vitamin B-12 (CYANOCOBALAMIN) 1000 MCG tablet Take 1,000 mcg by mouth daily.     No current facility-administered medications on file prior to visit.     Objective:     Vitals:   09/11/18 1046  BP: 136/77  Pulse: 71              Pessary Care Pessary removed and cleaned.  Vagina checked by speculum exam- without erosions - pessary replaced. Increased vaginal discharge- odor- consistent with BV/foreign object.    Assessment:    No obstetric history on file. Patient Active Problem List   Diagnosis Date Noted  . Vaginal odor 12/12/2017  . Hyperlipidemia 12/04/2017  . Incomplete uterovaginal prolapse 05/03/2016  . Vitamin D deficiency 03/31/2016  . OA (osteoarthritis) of knee 10/25/2015  . Nocturia 06/02/2015  . Cystocele, midline 06/02/2015  . Breast cancer screening 03/10/2015  . OA (osteoarthritis) of hip 08/26/2014  . Hypercalcemia 07/06/2014  . Vaginal erosion secondary to pessary use ([place]) 07/01/2014  . Hypothyroid 04/10/2013  . HTN (hypertension) 04/10/2013  . Arthritis 04/10/2013  . Osteoporosis 04/10/2013     1. Pessary maintenance   2. Bacterial vulvovaginitis     No erosions!   Plan:             1.  Continue pessary use.  Will treat with Flagyl for 1 week for suspected BV.  2.  Again discussed use of estrogen cream in detail and patient declined this option. Orders No orders of the defined types were placed in this encounter.    Meds ordered this encounter  Medications  . metroNIDAZOLE (FLAGYL) 500 MG tablet    Sig: Take 1 tablet (500 mg total) by mouth 2 (two) times daily for 7 days.    Dispense:  14 tablet    Refill:  0      F/U  Return in about 3 months (around 12/11/2018). I spent 18 minutes involved in the care of this patient of which greater than 50% was spent discussing pessary care, use of Premarin/estrogen cream as well as triamcinolone cream.  Possibility of vaginal infection with BV.  BV discussed in detail.  Treatment discussed.  Vaginal erosions and vaginal bleeding discussed.  All questions answered.  Finis Bud, M.D. 09/11/2018 11:22 AM

## 2018-09-11 NOTE — Progress Notes (Signed)
Patient comes in today for pessary check. Patient has ring pessary. She denies vaginal discharge. She does have an odor every once and a while.

## 2018-10-14 ENCOUNTER — Other Ambulatory Visit: Payer: Self-pay | Admitting: Family Medicine

## 2018-10-14 DIAGNOSIS — Z1231 Encounter for screening mammogram for malignant neoplasm of breast: Secondary | ICD-10-CM

## 2018-12-06 ENCOUNTER — Ambulatory Visit: Payer: Medicare Other | Admitting: Family Medicine

## 2018-12-06 ENCOUNTER — Encounter: Payer: Self-pay | Admitting: Family Medicine

## 2018-12-06 ENCOUNTER — Other Ambulatory Visit: Payer: Self-pay

## 2018-12-06 VITALS — BP 130/72 | HR 68 | Temp 97.5°F | Resp 16 | Ht 66.0 in | Wt 150.0 lb

## 2018-12-06 DIAGNOSIS — E890 Postprocedural hypothyroidism: Secondary | ICD-10-CM | POA: Insufficient documentation

## 2018-12-06 DIAGNOSIS — E559 Vitamin D deficiency, unspecified: Secondary | ICD-10-CM | POA: Diagnosis not present

## 2018-12-06 DIAGNOSIS — E892 Postprocedural hypoparathyroidism: Secondary | ICD-10-CM

## 2018-12-06 DIAGNOSIS — M810 Age-related osteoporosis without current pathological fracture: Secondary | ICD-10-CM | POA: Diagnosis not present

## 2018-12-06 DIAGNOSIS — E782 Mixed hyperlipidemia: Secondary | ICD-10-CM

## 2018-12-06 DIAGNOSIS — I10 Essential (primary) hypertension: Secondary | ICD-10-CM

## 2018-12-06 DIAGNOSIS — Z9889 Other specified postprocedural states: Secondary | ICD-10-CM

## 2018-12-06 MED ORDER — SYNTHROID 50 MCG PO TABS: ORAL_TABLET | ORAL | 5 refills | Status: DC

## 2018-12-06 MED ORDER — VALSARTAN 80 MG PO TABS: 80.0000 mg | ORAL_TABLET | Freq: Every day | ORAL | 5 refills | Status: DC

## 2018-12-06 NOTE — Progress Notes (Signed)
Name: Haley Schneider   MRN: [id number]    DOB: 01/30/1935   Date:12/06/2018       Progress Note  Subjective  Chief Complaint  Chief Complaint  Patient presents with  . Hypothyroidism    6 month follow up  . Hypertension    HPI  Hypothyroidism: History: hypothyroidism Current Medication Regimen: 41mcg synthroid - stable for many years Current Symptoms: weight changes, heat/cold intolerance, bowel/skin changes or CVS symptoms.  Endorses fatigue lately. Most recent results are below; we will not be repeating labs today. Lab Results  Component Value Date   TSH 0.95 12/04/2017   HTN: She is at goal today.  Taking Valsartan 80mg  and doing well on this.  Denies chest pain, shortness of breath (except with doing a lot of steps), BLE edema except when it is very hot; no severe headaches.  Vision change sees retina specialist for history of retinal closure.   Osteoporosis/Vitamin D deficiency:  She took herself off of the fossamax because the pills were too hard to swallow.  She is taking vitamin D supplement at 1000IU.  She stopped seeing Dr. Gabriel Carina and does not want to go back.  Discussed safety and avoiding falls.  Due for bone density scan - she would consider Prolia after bone density scan if needed.  HLD: Does not want to take statin therapy, and with her advanced age, I do not recommend. Taking aspirin daily.  Denies chest pain or shortness of breath.  Hypercalcemia/Parathyroid Hormone Elevation: Saw Dr. Gabriel Carina and had parathyroid removed in 2019.  Calcium had returned to normal after that.  Labs due today.   Patient Active Problem List   Diagnosis Date Noted  . Vaginal odor 12/12/2017  . Hyperlipidemia 12/04/2017  . Incomplete uterovaginal prolapse 05/03/2016  . Vitamin D deficiency 03/31/2016  . OA (osteoarthritis) of knee 10/25/2015  . Nocturia 06/02/2015  . Cystocele, midline 06/02/2015  . Breast cancer screening 03/10/2015  . OA (osteoarthritis) of hip 08/26/2014  .  Hypercalcemia 07/06/2014  . Vaginal erosion secondary to pessary use ([place]) 07/01/2014  . Hypothyroid 04/10/2013  . HTN (hypertension) 04/10/2013  . Arthritis 04/10/2013  . Osteoporosis 04/10/2013    Past Surgical History:  Procedure Laterality Date  . CATARACT EXTRACTION    . EYE SURGERY     cataract surgery bilat   . EYE SURGERY     laser eye surgery left eye"retinal acclution"laser  . HERNIA REPAIR    . THYROGLOSSAL DUCT CYST    . THYROIDECTOMY    . TONSILLECTOMY    . TONSILLECTOMY    . TOTAL HIP ARTHROPLASTY     left hip 7 years ago   . TOTAL HIP ARTHROPLASTY Right 08/26/2014   Procedure: RIGHT TOTAL HIP ARTHROPLASTY ANTERIOR APPROACH;  Surgeon: Gaynelle Arabian, MD;  Location: [facility];  Service: Orthopedics;  Laterality: Right;  . TOTAL KNEE ARTHROPLASTY Left 10/25/2015   Procedure: LEFT TOTAL KNEE ARTHROPLASTY;  Surgeon: Gaynelle Arabian, MD;  Location: [facility];  Service: Orthopedics;  Laterality: Left;    Family History  Problem Relation Age of Onset  . Alzheimer's disease Mother   . Cancer Father        lung  . Hypertension Father   . Stroke Paternal Grandfather   . Tuberculosis Paternal Grandmother   . Diabetes Son   . Breast cancer Paternal Aunt   . Heart disease Neg Hx     Social History   Socioeconomic History  . Marital status: Widowed    Spouse  name: Not on file  . Number of children: 2  . Years of education: Not on file  . Highest education level: Not on file  Occupational History  . Occupation: retired  Scientific laboratory technician  . Financial resource strain: Not hard at all  . Food insecurity    Worry: Never true    Inability: Never true  . Transportation needs    Medical: No    Non-medical: No  Tobacco Use  . Smoking status: Never Smoker  . Smokeless tobacco: Never Used  Substance and Sexual Activity  . Alcohol use: No  . Drug use: No  . Sexual activity: Not on file  Lifestyle  . Physical activity    Days per week: 0 days    Minutes per session: 0 min   . Stress: To some extent  Relationships  . Social connections    Talks on phone: More than three times a week    Gets together: More than three times a week    Attends religious service: More than 4 times per year    Active member of club or organization: No    Attends meetings of clubs or organizations: Never    Relationship status: Widowed  . Intimate partner violence    Fear of current or ex partner: No    Emotionally abused: No    Physically abused: No    Forced sexual activity: No  Other Topics Concern  . Not on file  Social History Narrative  . Not on file     Current Outpatient Medications:  .  aspirin EC 81 MG tablet, Take by mouth., Disp: , Rfl:  .  Biotin 10 MG CAPS, Take by mouth., Disp: , Rfl:  .  cholecalciferol (VITAMIN D) 1000 units tablet, Take 1,000 Units by mouth daily., Disp: , Rfl:  .  ibuprofen (ADVIL,MOTRIN) 200 MG tablet, Take by mouth., Disp: , Rfl:  .  Lutein 20 MG CAPS, Take by mouth., Disp: , Rfl:  .  Magnesium Citrate 100 MG TABS, Take by mouth., Disp: , Rfl:  .  OXYQUINOLONE SULFATE VAGINAL (TRIMO-SAN) 0.025 % GEL, Place vaginally., Disp: , Rfl:  .  SYNTHROID 50 MCG tablet, Take 1 tablet once daily, Disp: 30 tablet, Rfl: 5 .  tobramycin (TOBREX) 0.3 % ophthalmic solution, INSTILL 1 DROP IN LEFT EYE 4X/DAY BEGIN ONE DAY PRIOR TO TREATMENT, DAY OF TREATMENT & 1 DAY AFTER, Disp: , Rfl: 5 .  UNABLE TO FIND, Med Name: Magnesium unknown amt and vit b complex unknown amt 3 x a week., Disp: , Rfl:  .  valsartan (DIOVAN) 80 MG tablet, Take 1 tablet (80 mg total) by mouth daily. For blood pressure; this replaces losartan, Disp: 30 tablet, Rfl: 5 .  vitamin A 8000 UNIT capsule, Take by mouth., Disp: , Rfl:  .  vitamin B-12 (CYANOCOBALAMIN) 1000 MCG tablet, Take 1,000 mcg by mouth daily., Disp: , Rfl:  .  conjugated estrogens (PREMARIN) vaginal cream, 1 gram intravaginal BIW  (1/3 applicator) (Patient not taking: Reported on 12/06/2018), Disp: 60 g, Rfl: 1  No  Known Allergies  I personally reviewed active problem list, medication list, allergies, notes from last encounter, lab results with the patient/caregiver today.   ROS  Ten systems reviewed and is negative except as mentioned in HPI  Objective  Vitals:   12/06/18 0914  BP: 130/72  Pulse: 68  Resp: 16  Temp: (!) 97.5 F (36.4 C)  TempSrc: Temporal  SpO2: 94%  Weight: 150 lb (68  kg)  Height: 5\' 6"  (1.676 m)    Body mass index is 24.21 kg/m.  Physical Exam  Constitutional: Patient appears well-developed and well-nourished. No distress.  HENT: Head: Normocephalic and atraumatic.  Eyes: Conjunctivae and EOM are normal. No scleral icterus.   Neck: Normal range of motion. Neck supple. No JVD present. No thyromegaly present.  Cardiovascular: Normal rate, regular rhythm and normal heart sounds.  No murmur heard. No BLE edema. Pulmonary/Chest: Effort normal and breath sounds normal. No respiratory distress. Musculoskeletal: Normal range of motion, no joint effusions. No gross deformities Neurological: Pt is alert and oriented to person, place, and time. No cranial nerve deficit. Coordination, balance, strength, speech and gait are normal.  Skin: Skin is warm and dry. No rash noted. No erythema.  Psychiatric: Patient has a normal mood and affect. behavior is normal. Judgment and thought content normal.  No results found for this or any previous visit (from the past 72 hour(s)).   PHQ2/9: Depression screen [facility] 2/9 12/06/2018 06/06/2018 12/04/2017 05/30/2017 11/29/2016  Decreased Interest 0 0 0 0 0  Down, Depressed, Hopeless 0 0 0 0 0  PHQ - 2 Score 0 0 0 0 0  Altered sleeping 0 0 2 - -  Tired, decreased energy 0 0 1 - -  Change in appetite 0 0 0 - -  Feeling bad or failure about yourself  0 0 0 - -  Trouble concentrating 0 0 0 - -  Moving slowly or fidgety/restless 0 0 0 - -  Suicidal thoughts 0 0 0 - -  PHQ-9 Score 0 0 3 - -  Difficult doing work/chores Not difficult at all Not  difficult at all - - -   PHQ-2/9 Result is negative.    Fall Risk: Fall Risk  12/06/2018 06/06/2018 12/04/2017 05/30/2017 11/29/2016  Falls in the past year? 0 0 0 No No  Number falls in past yr: 0 0 0 - -  Injury with Fall? 0 0 - - -  Follow up Falls evaluation completed Falls evaluation completed - - -   Assessment & Plan  1. Postoperative hypothyroidism - SYNTHROID 50 MCG tablet; Take 1 tablet once daily  Dispense: 30 tablet; Refill: 5 - TSH - CBC with Differential/Platelet  2. Essential hypertension - COMPLETE METABOLIC PANEL WITH GFR - CBC with Differential/Platelet  3. Age-related osteoporosis without current pathological fracture - VITAMIN D 25 Hydroxy (Vit-D Deficiency, Fractures) - CBC with Differential/Platelet  4. Vitamin D deficiency - VITAMIN D 25 Hydroxy (Vit-D Deficiency, Fractures) - CBC with Differential/Platelet  5. Mixed hyperlipidemia - No statin, discussed dietary modifications only.  6. Hypercalcemia - COMPLETE METABOLIC PANEL WITH GFR  7. S/P parathyroidectomy

## 2018-12-07 LAB — COMPLETE METABOLIC PANEL WITH GFR
AG Ratio: 1.7 (calc) (ref 1.0–2.5)
ALT: 12 U/L (ref 6–29)
AST: 17 U/L (ref 10–35)
Albumin: 4.3 g/dL (ref 3.6–5.1)
Alkaline phosphatase (APISO): 86 U/L (ref 37–153)
BUN: 13 mg/dL (ref 7–25)
CO2: 25 mmol/L (ref 20–32)
Calcium: 9.3 mg/dL (ref 8.6–10.4)
Chloride: 98 mmol/L (ref 98–110)
Creat: 0.68 mg/dL (ref 0.60–0.88)
GFR, Est African American: 95 mL/min/{1.73_m2} (ref >=60)
GFR, Est Non African American: 82 mL/min/{1.73_m2} (ref >=60)
Globulin: 2.5 g/dL (calc) (ref 1.9–3.7)
Glucose, Bld: 83 mg/dL (ref 65–99)
Potassium: 4.3 mmol/L (ref 3.5–5.3)
Sodium: 134 mmol/L — ABNORMAL LOW (ref 135–146)
Total Bilirubin: 0.5 mg/dL (ref 0.2–1.2)
Total Protein: 6.8 g/dL (ref 6.1–8.1)

## 2018-12-07 LAB — CBC WITH DIFFERENTIAL/PLATELET
Absolute Monocytes: 663 cells/uL (ref 200–950)
Basophils Absolute: 51 cells/uL (ref 0–200)
Basophils Relative: 0.6 %
Eosinophils Absolute: 60 cells/uL (ref 15–500)
Eosinophils Relative: 0.7 %
HCT: 41.5 % (ref 35.0–45.0)
Hemoglobin: 14 g/dL (ref 11.7–15.5)
Lymphs Abs: 1904 cells/uL (ref 850–3900)
MCH: 28.7 pg (ref 27.0–33.0)
MCHC: 33.7 g/dL (ref 32.0–36.0)
MCV: 85.2 fL (ref 80.0–100.0)
MPV: 9.7 fL (ref 7.5–12.5)
Monocytes Relative: 7.8 %
Neutro Abs: 5823 cells/uL (ref 1500–7800)
Neutrophils Relative %: 68.5 %
Platelets: 336 10*3/uL (ref 140–400)
RBC: 4.87 10*6/uL (ref 3.80–5.10)
RDW: 12.9 % (ref 11.0–15.0)
Total Lymphocyte: 22.4 %
WBC: 8.5 10*3/uL (ref 3.8–10.8)

## 2018-12-07 LAB — TSH: TSH: 1.25 mIU/L (ref 0.40–4.50)

## 2018-12-07 LAB — VITAMIN D 25 HYDROXY (VIT D DEFICIENCY, FRACTURES): Vit D, 25-Hydroxy: 70 ng/mL (ref 30–100)

## 2018-12-11 ENCOUNTER — Encounter: Payer: Self-pay | Admitting: Obstetrics and Gynecology

## 2018-12-11 ENCOUNTER — Ambulatory Visit: Payer: Medicare Other | Admitting: Obstetrics and Gynecology

## 2018-12-11 ENCOUNTER — Other Ambulatory Visit: Payer: Self-pay

## 2018-12-11 VITALS — BP 168/88 | HR 78 | Ht 66.0 in | Wt 153.3 lb

## 2018-12-11 DIAGNOSIS — Z4689 Encounter for fitting and adjustment of other specified devices: Secondary | ICD-10-CM | POA: Diagnosis not present

## 2018-12-11 NOTE — Progress Notes (Signed)
HPI:      Haley Schneider is a 83 y.o. No obstetric history on file. who LMP was No LMP recorded. Patient is postmenopausal.  Subjective:   She presents today for pessary care.  She reports no issues, no discharge no bleeding.    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 She does not have any pertinent problems on file. She  has a past surgical history that includes Hernia repair; Tonsillectomy; Thyroidectomy; Total hip arthroplasty; Cataract extraction; Thyroglossal duct cyst; Tonsillectomy; Total hip arthroplasty (Right, 08/26/2014); Eye surgery; Eye surgery; and Total knee arthroplasty (Left, 10/25/2015). Her family history includes Alzheimer's disease in her mother; Breast cancer in her paternal aunt; Cancer in her father; Diabetes in her son; Hypertension in her father; Stroke in her paternal grandfather; Tuberculosis in her paternal grandmother. She  reports that she has never smoked. She has never used smokeless tobacco. She reports that she does not drink alcohol or use drugs. She has a current medication list which includes the following prescription(s): aspirin ec, biotin, cholecalciferol, conjugated estrogens, ibuprofen, lutein, magnesium citrate, oxyquinolone sulfate vaginal, synthroid, tobramycin, UNABLE TO FIND, valsartan, vitamin a, and vitamin b-12. She has No Known Allergies.       Review of Systems:   Review of Systems  Constitutional: Denied constitutional symptoms, night sweats, recent illness, fatigue, fever, insomnia and weight loss.  Eyes: Denied eye symptoms, eye pain, photophobia, vision change and visual disturbance.  Ears/Nose/Throat/Neck: Denied ear, nose, throat or neck symptoms, hearing loss, nasal discharge, sinus congestion and sore throat.  Cardiovascular: Denied cardiovascular symptoms, arrhythmia, chest pain/pressure, edema, exercise intolerance, orthopnea and palpitations.  Respiratory: Denied pulmonary symptoms, asthma, pleuritic pain, productive sputum, cough, dyspnea and wheezing.  Gastrointestinal: Denied, gastro-esophageal reflux, melena, nausea and vomiting.  Genitourinary: Denied genitourinary symptoms including symptomatic vaginal discharge, pelvic relaxation issues, and urinary complaints.  Musculoskeletal: Denied musculoskeletal symptoms, stiffness, swelling, muscle weakness and myalgia.  Dermatologic: Denied dermatology symptoms, rash and scar.  Neurologic: Denied neurology symptoms, dizziness, headache, neck pain and syncope.  Psychiatric: Denied psychiatric symptoms, anxiety and depression.  Endocrine: Denied endocrine symptoms including hot flashes and night sweats.   Meds:   Current Outpatient Medications on File Prior to Visit  Medication Sig Dispense Refill  . aspirin EC 81 MG tablet Take by mouth.    . Biotin 10 MG CAPS Take by mouth.    . cholecalciferol (VITAMIN D) 1000 units tablet Take 1,000 Units by mouth daily.    Marland Kitchen conjugated estrogens (PREMARIN) vaginal cream 1 gram intravaginal BIW  (1/3 applicator) 60 g 1  . ibuprofen (ADVIL,MOTRIN) 200 MG tablet Take by mouth.    . Lutein 20 MG CAPS Take by mouth.    . Magnesium Citrate 100 MG TABS Take by mouth.    Levin Erp SULFATE VAGINAL (TRIMO-SAN) 0.025 % GEL Place vaginally.    Marland Kitchen SYNTHROID 50 MCG tablet Take 1 tablet once daily 30 tablet 5  . tobramycin (TOBREX) 0.3 % ophthalmic solution  INSTILL 1 DROP IN LEFT  EYE 4X/DAY BEGIN ONE DAY PRIOR TO TREATMENT, DAY OF TREATMENT & 1 DAY AFTER  5  . UNABLE TO FIND Med Name: Magnesium unknown amt and vit b complex unknown amt 3 x a week.    . valsartan (DIOVAN) 80 MG tablet Take 1 tablet (80 mg total) by mouth daily. For blood pressure; this replaces losartan 30 tablet 5  . vitamin A 8000 UNIT capsule Take by mouth.    . vitamin B-12 (CYANOCOBALAMIN) 1000 MCG tablet Take 1,000 mcg by mouth daily.     No current facility-administered medications on file prior to visit.     Objective:     Vitals:   12/11/18 1047  BP: (!) 168/88  Pulse: 78              Pessary Care Pessary removed and cleaned.  Vagina checked by speculum exam- without erosions - pessary replaced.    Assessment:    No obstetric history on file. Patient Active Problem List   Diagnosis Date Noted  . Postoperative hypothyroidism 12/06/2018  . S/P parathyroidectomy 12/06/2018  . Vaginal odor 12/12/2017  . Mixed hyperlipidemia 12/04/2017  . Incomplete uterovaginal prolapse 05/03/2016  . Vitamin D deficiency 03/31/2016  . OA (osteoarthritis) of knee 10/25/2015  . Nocturia 06/02/2015  . Cystocele, midline 06/02/2015  . Breast cancer screening 03/10/2015  . OA (osteoarthritis) of hip 08/26/2014  . Hypercalcemia 07/06/2014  . Vaginal erosion secondary to pessary use ([place]) 07/01/2014  . Hypothyroid 04/10/2013  . Essential hypertension 04/10/2013  . Arthritis 04/10/2013  . Osteoporosis 04/10/2013     1. Pessary maintenance     No issues at this time with her pessary.   Plan:            1.  Pessary removed cleaned and replaced.  Plan follow-up in 4 months. Orders No orders of the defined types were placed in this encounter.   No orders of the defined types were placed in this encounter.     F/U  No follow-ups on file. I spent 16 minutes involved in the care of this patient of which greater than 50% was spent discussing pessary care and  maintenance, previous use of Flagyl.  All questions answered.  Finis Bud, M.D. 12/11/2018 11:10 AM

## 2018-12-11 NOTE — Progress Notes (Signed)
Patient comes in today for pessary check. She has ring pessary. Denies any problems.

## 2019-01-07 ENCOUNTER — Other Ambulatory Visit: Payer: Medicare Other

## 2019-02-04 ENCOUNTER — Ambulatory Visit (INDEPENDENT_AMBULATORY_CARE_PROVIDER_SITE_OTHER): Payer: Medicare PPO

## 2019-02-04 VITALS — Ht 66.0 in | Wt 151.0 lb

## 2019-02-04 DIAGNOSIS — Z0000 Encounter for general adult medical examination without abnormal findings: Secondary | ICD-10-CM

## 2019-02-04 NOTE — Patient Instructions (Signed)
[name] , Thank you for taking time to come for your Medicare Wellness Visit. I appreciate your ongoing commitment to your health goals. Please review the following plan we discussed and let me know if I can assist you in the future.   Screening recommendations/referrals: Colonoscopy: no longer required Mammogram: done 06/15/17. Scheduled for 02/05/19 Bone Density: done 06/12/16. Scheduled for 02/05/19 Recommended yearly ophthalmology/optometry visit for glaucoma screening and checkup Recommended yearly dental visit for hygiene and checkup  Vaccinations: Influenza vaccine: postponed Pneumococcal vaccine: done 11/09/16 Tdap vaccine: due Shingles vaccine: Shingrix discussed. Please contact your pharmacy for coverage information.   Advanced directives: Please bring a copy of your health care power of attorney and living will to the office at your convenience.  Conditions/risks identified: Recommend physical activity of 150 minutes per week  Next appointment: Please follow up in one year for your Medicare Annual Wellness visit.     Preventive Care 40 Years and Older, Female Preventive care refers to lifestyle choices and visits with your health care provider that can promote health and wellness. What does preventive care include?  A yearly physical exam. This is also called an annual well check.  Dental exams once or twice a year.  Routine eye exams. Ask your health care provider how often you should have your eyes checked.  Personal lifestyle choices, including:  Daily care of your teeth and gums.  Regular physical activity.  Eating a healthy diet.  Avoiding tobacco and drug use.  Limiting alcohol use.  Practicing safe sex.  Taking low-dose aspirin every day.  Taking vitamin and mineral supplements as recommended by your health care provider. What happens during an annual well check? The services and screenings done by your health care provider during your annual well  check will depend on your age, overall health, lifestyle risk factors, and family history of disease. Counseling  Your health care provider may ask you questions about your:  Alcohol use.  Tobacco use.  Drug use.  Emotional well-being.  Home and relationship well-being.  Sexual activity.  Eating habits.  History of falls.  Memory and ability to understand (cognition).  Work and work Statistician.  Reproductive health. Screening  You may have the following tests or measurements:  Height, weight, and BMI.  Blood pressure.  Lipid and cholesterol levels. These may be checked every 5 years, or more frequently if you are over 66 years old.  Skin check.  Lung cancer screening. You may have this screening every year starting at age 59 if you have a 30-pack-year history of smoking and currently smoke or have quit within the past 15 years.  Fecal occult blood test (FOBT) of the stool. You may have this test every year starting at age 78.  Flexible sigmoidoscopy or colonoscopy. You may have a sigmoidoscopy every 5 years or a colonoscopy every 10 years starting at age 72.  Hepatitis C blood test.  Hepatitis B blood test.  Sexually transmitted disease (STD) testing.  Diabetes screening. This is done by checking your blood sugar (glucose) after you have not eaten for a while (fasting). You may have this done every 1-3 years.  Bone density scan. This is done to screen for osteoporosis. You may have this done starting at age 41.  Mammogram. This may be done every 1-2 years. Talk to your health care provider about how often you should have regular mammograms. Talk with your health care provider about your test results, treatment options, and if necessary, the need for  more tests. Vaccines  Your health care provider may recommend certain vaccines, such as:  Influenza vaccine. This is recommended every year.  Tetanus, diphtheria, and acellular pertussis (Tdap, Td) vaccine. You  may need a Td booster every 10 years.  Zoster vaccine. You may need this after age 33.  Pneumococcal 13-valent conjugate (PCV13) vaccine. One dose is recommended after age 45.  Pneumococcal polysaccharide (PPSV23) vaccine. One dose is recommended after age 20. Talk to your health care provider about which screenings and vaccines you need and how often you need them. This information is not intended to replace advice given to you by your health care provider. Make sure you discuss any questions you have with your health care provider. Document Released: 01/15/2015 Document Revised: 09/08/2015 Document Reviewed: 10/20/2014 Elsevier Interactive Patient Education  2017 [place] Prevention in the Home Falls can cause injuries. They can happen to people of all ages. There are many things you can do to make your home safe and to help prevent falls. What can I do on the outside of my home?  Regularly fix the edges of walkways and driveways and fix any cracks.  Remove anything that might make you trip as you walk through a door, such as a raised step or threshold.  Trim any bushes or trees on the path to your home.  Use bright outdoor lighting.  Clear any walking paths of anything that might make someone trip, such as rocks or tools.  Regularly check to see if handrails are loose or broken. Make sure that both sides of any steps have handrails.  Any raised decks and porches should have guardrails on the edges.  Have any leaves, snow, or ice cleared regularly.  Use sand or salt on walking paths during winter.  Clean up any spills in your garage right away. This includes oil or grease spills. What can I do in the bathroom?  Use night lights.  Install grab bars by the toilet and in the tub and shower. Do not use towel bars as grab bars.  Use non-skid mats or decals in the tub or shower.  If you need to sit down in the shower, use a plastic, non-slip stool.  Keep the floor  dry. Clean up any water that spills on the floor as soon as it happens.  Remove soap buildup in the tub or shower regularly.  Attach bath mats securely with double-sided non-slip rug tape.  Do not have throw rugs and other things on the floor that can make you trip. What can I do in the bedroom?  Use night lights.  Make sure that you have a light by your bed that is easy to reach.  Do not use any sheets or blankets that are too big for your bed. They should not hang down onto the floor.  Have a firm chair that has side arms. You can use this for support while you get dressed.  Do not have throw rugs and other things on the floor that can make you trip. What can I do in the kitchen?  Clean up any spills right away.  Avoid walking on wet floors.  Keep items that you use a lot in easy-to-reach places.  If you need to reach something above you, use a strong step stool that has a grab bar.  Keep electrical cords out of the way.  Do not use floor polish or wax that makes floors slippery. If you must use wax, use non-skid  floor wax.  Do not have throw rugs and other things on the floor that can make you trip. What can I do with my stairs?  Do not leave any items on the stairs.  Make sure that there are handrails on both sides of the stairs and use them. Fix handrails that are broken or loose. Make sure that handrails are as long as the stairways.  Check any carpeting to make sure that it is firmly attached to the stairs. Fix any carpet that is loose or worn.  Avoid having throw rugs at the top or bottom of the stairs. If you do have throw rugs, attach them to the floor with carpet tape.  Make sure that you have a light switch at the top of the stairs and the bottom of the stairs. If you do not have them, ask someone to add them for you. What else can I do to help prevent falls?  Wear shoes that:  Do not have high heels.  Have rubber bottoms.  Are comfortable and fit you  well.  Are closed at the toe. Do not wear sandals.  If you use a stepladder:  Make sure that it is fully opened. Do not climb a closed stepladder.  Make sure that both sides of the stepladder are locked into place.  Ask someone to hold it for you, if possible.  Clearly mark and make sure that you can see:  Any grab bars or handrails.  First and last steps.  Where the edge of each step is.  Use tools that help you move around (mobility aids) if they are needed. These include:  Canes.  Walkers.  Scooters.  Crutches.  Turn on the lights when you go into a dark area. Replace any light bulbs as soon as they burn out.  Set up your furniture so you have a clear path. Avoid moving your furniture around.  If any of your floors are uneven, fix them.  If there are any pets around you, be aware of where they are.  Review your medicines with your doctor. Some medicines can make you feel dizzy. This can increase your chance of falling. Ask your doctor what other things that you can do to help prevent falls. This information is not intended to replace advice given to you by your health care provider. Make sure you discuss any questions you have with your health care provider. Document Released: 10/15/2008 Document Revised: 05/27/2015 Document Reviewed: 01/23/2014 Elsevier Interactive Patient Education  2017 [employer].

## 2019-02-04 NOTE — Progress Notes (Signed)
Subjective:   Haley Schneider is a 84 y.o. female who presents for Medicare Annual (Subsequent) preventive examination.  Virtual Visit via Telephone Note  I connected with [name] on 02/04/19 at 10:00 AM EST by telephone and verified that I am speaking with the correct person using two identifiers.  Medicare Annual Wellness visit completed telephonically due to Covid-19 pandemic.   Location: Patient: home Provider: office   I discussed the limitations, risks, security and privacy concerns of performing an evaluation and management service by telephone and the availability of in person appointments. The patient expressed understanding and agreed to proceed.  Some vital signs may be absent or patient reported.   Clemetine Marker, LPN    Review of Systems:   Cardiac Risk Factors include: advanced age (>55men, >74 women);dyslipidemia;hypertension     Objective:     Vitals: Ht 5\' 6"  (1.676 m)   Wt 151 lb (68.5 kg)   BMI 24.37 kg/m   Body mass index is 24.37 kg/m.  Advanced Directives 02/04/2019 12/04/2017 03/08/2016 10/25/2015 10/18/2015 09/01/2015 08/26/2014  Does Patient Have a Medical Advance Directive? Yes Yes Yes Yes Yes Yes Yes  Type of Paramedic of [place];Living will Living will;Healthcare Power of Attorney - Living will;Healthcare Power of Attorney Living will;Healthcare Power of Attorney Living will [place]  Does patient want to make changes to medical advance directive? - - - No - Patient declined No - Patient declined - No - Patient declined  Copy of [place] in Chart? No - copy requested No - copy requested - No - copy requested No - copy requested No - copy requested No - copy requested    Tobacco Social History   Tobacco Use  Smoking Status Never Smoker  Smokeless Tobacco Never Used     Counseling given: Not Answered   Clinical Intake:  Pre-visit preparation completed: Yes  Pain :  No/denies pain     BMI - recorded: 24.37 Nutritional Status: BMI of 19-24  Normal Nutritional Risks: None Diabetes: No  How often do you need to have someone help you when you read instructions, pamphlets, or other written materials from your doctor or pharmacy?: 1 - Never  Interpreter Needed?: No  Information entered by :: Clemetine Marker LPN  Past Medical History:  Diagnosis Date  . Arthritis   . Fibroid, uterine   . GERD (gastroesophageal reflux disease)   . History of abnormal cervical Pap smear   . History of frequent urinary tract infections   . History of urinary tract infection   . Hypercholesteremia   . Hypertension   . Menopause   . Nocturia   . OP (osteoporosis)   . Pessary maintenance    # 5 ring with support  . PONV (postoperative nausea and vomiting)    many yrs ago  . Retinal vessel occlusion   . 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 Urethral caruncle   . Urinary incontinence   . Urinary urgency   . Vaginal atrophy    Past Surgical History:  Procedure Laterality Date  . CATARACT EXTRACTION    . EYE SURGERY     cataract surgery bilat   . EYE SURGERY     laser eye surgery left eye"retinal acclution"laser  . HERNIA REPAIR    . THYROGLOSSAL DUCT CYST    . THYROIDECTOMY    . TONSILLECTOMY    . TONSILLECTOMY    . TOTAL HIP ARTHROPLASTY  left hip 7 years ago   . TOTAL HIP ARTHROPLASTY Right 08/26/2014   Procedure: RIGHT TOTAL HIP ARTHROPLASTY ANTERIOR APPROACH;  Surgeon: Gaynelle Arabian, MD;  Location: [facility];  Service: Orthopedics;  Laterality: Right;  . TOTAL KNEE ARTHROPLASTY Left 10/25/2015   Procedure: LEFT TOTAL KNEE ARTHROPLASTY;  Surgeon: Gaynelle Arabian, MD;  Location: [facility];  Service: Orthopedics;  Laterality: Left;   Family History  Problem Relation Age of Onset  . Alzheimer's disease Mother   . Cancer Father        lung  . Hypertension Father   . Stroke Paternal Grandfather   . Tuberculosis Paternal Grandmother   .  Diabetes Son   . Breast cancer Paternal Aunt   . Heart disease Neg Hx    Social History   Socioeconomic History  . Marital status: Widowed    Spouse name: Not on file  . Number of children: 2  . Years of education: Not on file  . Highest education level: Not on file  Occupational History  . Occupation: retired  Tobacco Use  . Smoking status: Never Smoker  . Smokeless tobacco: Never Used  Substance and Sexual Activity  . Alcohol use: No  . Drug use: No  . Sexual activity: Not on file  Other Topics Concern  . Not on file  Social History Narrative  . Not on file   Social Determinants of Health   Financial Resource Strain: Low Risk   . Difficulty of Paying Living Expenses: Not hard at all  Food Insecurity: No Food Insecurity  . Worried About Charity fundraiser in the Last Year: Never true  . Ran Out of Food in the Last Year: Never true  Transportation Needs: No Transportation Needs  . Lack of Transportation (Medical): No  . Lack of Transportation (Non-Medical): No  Physical Activity: Inactive  . Days of Exercise per Week: 0 days  . Minutes of Exercise per Session: 0 min  Stress: Stress Concern Present  . Feeling of Stress : To some extent  Social Connections: Somewhat Isolated  . Frequency of Communication with Friends and Family: More than three times a week  . Frequency of Social Gatherings with Friends and Family: More than three times a week  . Attends Religious Services: More than 4 times per year  . Active Member of Clubs or Organizations: No  . Attends Archivist Meetings: Never  . Marital Status: Widowed    Outpatient Encounter Medications as of 02/04/2019  Medication Sig  . ascorbic acid (VITAMIN C) 1000 MG tablet Take 1 tablet by mouth daily.  Marland Kitchen aspirin EC 81 MG tablet Take by mouth.  . Biotin 10 MG CAPS Take by mouth.  Marland Kitchen CALCIUM-MAGNESIUM-ZINC PO Take by mouth.  . cholecalciferol (VITAMIN D) 1000 units tablet Take 1,000 Units by mouth daily.  Marland Kitchen  ibuprofen (ADVIL,MOTRIN) 200 MG tablet Take by mouth.  . Lutein 20 MG CAPS Take by mouth.  Levin Erp SULFATE VAGINAL (TRIMO-SAN) 0.025 % GEL Place vaginally.  Marland Kitchen SYNTHROID 50 MCG tablet Take 1 tablet once daily  . tobramycin (TOBREX) 0.3 % ophthalmic solution INSTILL 1 DROP IN LEFT EYE 4X/DAY BEGIN ONE DAY PRIOR TO TREATMENT, DAY OF TREATMENT & 1 DAY AFTER  . valsartan (DIOVAN) 80 MG tablet Take 1 tablet (80 mg total) by mouth daily. For blood pressure; this replaces losartan  . vitamin A 8000 UNIT capsule Take by mouth.  . vitamin B-12 (CYANOCOBALAMIN) 1000 MCG tablet Take 1,000 mcg by mouth  daily.  . [DISCONTINUED] conjugated estrogens (PREMARIN) vaginal cream 1 gram intravaginal BIW  (1/3 applicator)  . [DISCONTINUED] Magnesium Citrate 100 MG TABS Take by mouth.  . [DISCONTINUED] UNABLE TO FIND Med Name: Magnesium unknown amt and vit b complex unknown amt 3 x a week.   No facility-administered encounter medications on file as of 02/04/2019.    Activities of Daily Living In your present state of health, do you have any difficulty performing the following activities: 02/04/2019  Hearing? N  Comment declines hearing aids  Vision? N  Difficulty concentrating or making decisions? N  Walking or climbing stairs? N  Dressing or bathing? N  Doing errands, shopping? N  Preparing Food and eating ? N  Using the Toilet? N  In the past six months, have you accidently leaked urine? N  Do you have problems with loss of bowel control? N  Managing your Medications? N  Managing your Finances? N  Housekeeping or managing your Housekeeping? N  Some recent data might be hidden    Patient Care Team: Hubbard Hartshorn, FNP as PCP - General (Family Medicine) Defrancesco, Alanda Slim, MD as Consulting Physician (Obstetrics and Gynecology) Gaynelle Arabian, MD as Consulting Physician (Orthopedic Surgery) Sherlynn Stalls, MD as Consulting Physician (Ophthalmology)    Assessment:   This is a routine wellness  examination for [place].  Exercise Activities and Dietary recommendations Current Exercise Habits: Home exercise routine, Type of exercise: walking, Time (Minutes): 10, Frequency (Times/Week): 7, Weekly Exercise (Minutes/Week): 70, Intensity: Mild, Exercise limited by: None identified  Goals    . Increase physical activity     Recommend increasing physical activity to 150 minutes per week       Fall Risk Fall Risk  02/04/2019 12/06/2018 06/06/2018 12/04/2017 05/30/2017  Falls in the past year? 0 0 0 0 No  Number falls in past yr: 0 0 0 0 -  Injury with Fall? 0 0 0 - -  Risk for fall due to : No Fall Risks - - - -  Follow up Falls prevention discussed Falls evaluation completed Falls evaluation completed - -   FALL RISK PREVENTION PERTAINING TO THE HOME:  Any stairs in or around the home? Yes  If so, do they handrails? Yes   Home free of loose throw rugs in walkways, pet beds, electrical cords, etc? Yes  Adequate lighting in your home to reduce risk of falls? Yes   ASSISTIVE DEVICES UTILIZED TO PREVENT FALLS:  Life alert? No  Use of a cane, walker or w/c? No  Grab bars in the bathroom? Yes  Shower chair or bench in shower? No  Elevated toilet seat or a handicapped toilet? Yes  DME ORDERS:  DME order needed?  No   TIMED UP AND GO:  Was the test performed? No . Telephonic visit.   Education: Fall risk prevention has been discussed.  Intervention(s) required? No   Depression Screen PHQ 2/9 Scores 02/04/2019 12/06/2018 06/06/2018 12/04/2017  PHQ - 2 Score 0 0 0 0  PHQ- 9 Score - 0 0 3     Cognitive Function -  PT declined 6CIT for 2021 AWV     6CIT Screen 12/04/2017  What Year? 0 points  What month? 0 points  What time? 0 points  Count back from 20 0 points  Months in reverse 0 points  Repeat phrase 0 points  Total Score 0    Immunization History  Administered Date(s) Administered  . Influenza-Unspecified 12/11/2013  . PFIZER SARS-COV-2 Vaccination  01/09/2019,  01/30/2019  . Pneumococcal Conjugate-13 03/10/2015  . Pneumococcal Polysaccharide-23 11/09/2016    Qualifies for Shingles Vaccine? Yes  . Due for Shingrix. Education has been provided regarding the importance of this vaccine. Pt has been advised to call insurance company to determine out of pocket expense. Advised may also receive vaccine at local pharmacy or Health Dept. Verbalized acceptance and understanding.  Tdap: Although this vaccine is not a covered service during a Wellness Exam, does the patient still wish to receive this vaccine today?  No .  Education has been provided regarding the importance of this vaccine. Advised may receive this vaccine at local pharmacy or Health Dept. Aware to provide a copy of the vaccination record if obtained from local pharmacy or Health Dept. Verbalized acceptance and understanding.  Flu Vaccine: Due for Flu vaccine. Does the patient want to receive this vaccine today?  No . Education has been provided regarding the importance of this vaccine but still declined. Advised may receive this vaccine at local pharmacy or Health Dept. Aware to provide a copy of the vaccination record if obtained from local pharmacy or Health Dept. Verbalized acceptance and understanding.  Pneumococcal Vaccine: Up to date   Screening Tests Health Maintenance  Topic Date Due  . MAMMOGRAM  06/16/2018  . INFLUENZA VACCINE  04/02/2019 (Originally 08/03/2018)  . TETANUS/TDAP  01/03/2020 (Originally 03/03/1954)  . DEXA SCAN  Completed  . PNA vac Low Risk Adult  Completed    Cancer Screenings:  Colorectal Screening: No longer required.   Mammogram: Completed 06/15/17. Repeat every year. Scheduled for 02/05/19  Bone Density: Completed 06/12/16. Results reflect  OSTEOPOROSIS. Repeat every 2 years. Scheduled for 02/05/19.   Lung Cancer Screening: (Low Dose CT Chest recommended if Age 66-80 years, 30 pack-year currently smoking OR have quit w/in 15years.) does not qualify.   Additional  Screening:   Hepatitis C Screening:  No longer required  Vision Screening: Recommended annual ophthalmology exams for early detection of glaucoma and other disorders of the eye. Is the patient up to date with their annual eye exam?  Yes  Who is the provider or what is the name of the office in which the pt attends annual eye exams? Dr. Baird [facility]  Dental Screening: Recommended annual dental exams for proper oral hygiene  Community Resource Referral:  CRR required this visit?  No       Plan:     I have personally reviewed and addressed the Medicare Annual Wellness questionnaire and have noted the following in the patient's chart:  A. Medical and social history B. Use of alcohol, tobacco or illicit drugs  C. Current medications and supplements D. Functional ability and status E.  Nutritional status F.  Physical activity G. Advance directives H. List of other physicians I.  Hospitalizations, surgeries, and ER visits in previous 12 months J.  [place] such as hearing and vision if needed, cognitive and depression L. Referrals and appointments   In addition, I have reviewed and discussed with patient certain preventive protocols, quality metrics, and best practice recommendations. A written personalized care plan for preventive services as well as general preventive health recommendations were provided to patient.   Signed,  Clemetine Marker, LPN Nurse Health Advisor   Nurse Notes: none

## 2019-02-05 ENCOUNTER — Ambulatory Visit
Admission: RE | Admit: 2019-02-05 | Discharge: 2019-02-05 | Disposition: A | Discharge status: Home / Self Care | Payer: Medicare PPO | Source: Ambulatory Visit | Attending: Family Medicine | Admitting: Family Medicine

## 2019-02-05 DIAGNOSIS — M810 Age-related osteoporosis without current pathological fracture: Secondary | ICD-10-CM

## 2019-02-05 DIAGNOSIS — Z1231 Encounter for screening mammogram for malignant neoplasm of breast: Secondary | ICD-10-CM | POA: Diagnosis present

## 2019-02-13 ENCOUNTER — Telehealth: Payer: Self-pay

## 2019-02-13 NOTE — Telephone Encounter (Signed)
Patient stated that she has been having spotting for about a week. It is not heavy. Patient denies any other symptoms. I have scheduled her to come in Tuesday to be checked.

## 2019-02-13 NOTE — Telephone Encounter (Signed)
Patient is experiencing, spotting. Please call to advise

## 2019-02-18 ENCOUNTER — Other Ambulatory Visit: Payer: Self-pay

## 2019-02-18 ENCOUNTER — Ambulatory Visit (INDEPENDENT_AMBULATORY_CARE_PROVIDER_SITE_OTHER): Payer: Medicare PPO | Admitting: Obstetrics and Gynecology

## 2019-02-18 ENCOUNTER — Encounter: Payer: Self-pay | Admitting: Obstetrics and Gynecology

## 2019-02-18 VITALS — BP 167/84 | HR 80 | Ht 66.0 in | Wt 154.0 lb

## 2019-02-18 DIAGNOSIS — R1030 Lower abdominal pain, unspecified: Secondary | ICD-10-CM

## 2019-02-18 DIAGNOSIS — N3001 Acute cystitis with hematuria: Secondary | ICD-10-CM | POA: Diagnosis not present

## 2019-02-18 LAB — POCT URINALYSIS DIPSTICK
Bilirubin, UA: NEGATIVE
Glucose, UA: NEGATIVE
Ketones, UA: NEGATIVE
Nitrite, UA: NEGATIVE
Protein, UA: NEGATIVE
Spec Grav, UA: 1.01 (ref 1.010–1.025)
Urobilinogen, UA: 0.2 E.U./dL
pH, UA: 6.5 (ref 5.0–8.0)

## 2019-02-18 MED ORDER — NITROFURANTOIN MONOHYD MACRO 100 MG PO CAPS: 100.0000 mg | ORAL_CAPSULE | Freq: Two times a day (BID) | ORAL | 1 refills | Status: DC

## 2019-02-18 NOTE — Progress Notes (Addendum)
HPI:      [name] is a 84 y.o. No obstetric history on file. who LMP was No LMP recorded. Patient is postmenopausal.  Subjective:   She presents today stating that she had some spotting for a few days last week but it has resolved at this point.  She also complains of pelvic pressure symptoms "similar to when I have a UTI". She also has stated that for several months each year she has her pessary removed and leaves it out during that time for a yearly break.    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 She does not have any pertinent problems on file. She  has a past surgical history that includes Hernia repair; Tonsillectomy; Thyroidectomy; Total hip arthroplasty; Cataract extraction; Thyroglossal duct cyst; Tonsillectomy; Total hip arthroplasty (Right, 08/26/2014); Eye surgery; Eye surgery; and Total knee arthroplasty (Left, 10/25/2015). Her family history includes Alzheimer's disease in her mother; Breast cancer in her paternal aunt; Cancer in her father; Diabetes in her son; Hypertension in her father; Stroke in her paternal grandfather; Tuberculosis in her paternal grandmother. She  reports that she has never smoked. She has never used smokeless tobacco. She reports that she does not drink alcohol or use drugs. She has a current medication list which includes the following prescription(s): ascorbic acid,  aspirin ec, biotin, calcium-magnesium-zinc, cholecalciferol, ibuprofen, lutein, nitrofurantoin (macrocrystal-monohydrate), oxyquinolone sulfate vaginal, synthroid, tobramycin, valsartan, vitamin a, and vitamin b-12. She has No Known Allergies.       Review of Systems:  Review of Systems  Constitutional: Denied constitutional symptoms, night sweats, recent illness, fatigue, fever, insomnia and weight loss.  Eyes: Denied eye symptoms, eye pain, photophobia, vision change and visual disturbance.  Ears/Nose/Throat/Neck: Denied ear, nose, throat or neck symptoms, hearing loss, nasal discharge, sinus congestion and sore throat.  Cardiovascular: Denied cardiovascular symptoms, arrhythmia, chest pain/pressure, edema, exercise intolerance, orthopnea and palpitations.  Respiratory: Denied pulmonary symptoms, asthma, pleuritic pain, productive sputum, cough, dyspnea and wheezing.  Gastrointestinal: Denied, gastro-esophageal reflux, melena, nausea and vomiting.  Genitourinary: See HPI for additional information.  Musculoskeletal: Denied musculoskeletal symptoms, stiffness, swelling, muscle weakness and myalgia.  Dermatologic: Denied dermatology symptoms, rash and scar.  Neurologic: Denied neurology symptoms, dizziness, headache, neck pain and syncope.  Psychiatric: Denied psychiatric symptoms, anxiety and depression.  Endocrine: Denied endocrine symptoms including hot flashes and night sweats.   Meds:   Current Outpatient Medications on File Prior to Visit  Medication Sig Dispense Refill  . ascorbic acid (VITAMIN C) 1000 MG tablet Take 1 tablet by mouth daily.    Marland Kitchen aspirin EC 81 MG tablet Take by mouth.    . Biotin 10 MG CAPS Take by mouth.    Marland Kitchen CALCIUM-MAGNESIUM-ZINC PO Take by mouth.    . cholecalciferol (VITAMIN D) 1000 units tablet Take 1,000 Units by mouth daily.    Marland Kitchen ibuprofen (ADVIL,MOTRIN) 200 MG tablet Take by mouth.    . Lutein 20 MG CAPS Take by mouth.    Levin Erp SULFATE VAGINAL  (  TRIMO-SAN) 0.025 % GEL Place vaginally.    Marland Kitchen SYNTHROID 50 MCG tablet Take 1 tablet once daily 30 tablet 5  . tobramycin (TOBREX) 0.3 % ophthalmic solution INSTILL 1 DROP IN LEFT EYE 4X/DAY BEGIN ONE DAY PRIOR TO TREATMENT, DAY OF TREATMENT & 1 DAY AFTER  5  . valsartan (DIOVAN) 80 MG tablet Take 1 tablet (80 mg total) by mouth daily. For blood pressure; this replaces losartan 30 tablet 5  . vitamin A 8000 UNIT capsule Take by mouth.    . vitamin B-12 (CYANOCOBALAMIN) 1000 MCG tablet Take 1,000 mcg by mouth daily.     No current facility-administered medications on file prior to visit.    Objective:     Vitals:   02/18/19 1145  BP: (!) 167/84  Pulse: 80              Urine dip performed -see results showing blood and WBCs.  Assessment:    No obstetric history on file. Patient Active Problem List   Diagnosis Date Noted  . Postoperative hypothyroidism 12/06/2018  . S/P parathyroidectomy 12/06/2018  . Vaginal odor 12/12/2017  . Mixed hyperlipidemia 12/04/2017  . Incomplete uterovaginal prolapse 05/03/2016  . Vitamin D deficiency 03/31/2016  . OA (osteoarthritis) of knee 10/25/2015  . Nocturia 06/02/2015  . Cystocele, midline 06/02/2015  . Breast cancer screening 03/10/2015  . OA (osteoarthritis) of hip 08/26/2014  . Hypercalcemia 07/06/2014  . Vaginal erosion secondary to pessary use ([place]) 07/01/2014  . Hypothyroid 04/10/2013  . Essential hypertension 04/10/2013  . Arthritis 04/10/2013  . Osteoporosis 04/10/2013     1. Lower abdominal pain   2. Acute cystitis with hematuria     Probable cystitis-cannot rule out pessary as source of bleeding.   Plan:            1.  Macrobid for UTI -urine sent for C&S.  2.  Patient to continue pessary care with weekly gel  3.  Follow-up in April for pessary removal and cleaning and erosion check.  If patient continues to have bleeding next week recommend follow-up appointment for pessary removal and speculum  examination. Orders Orders Placed This Encounter  Procedures  . Urine Culture  . POCT urinalysis dipstick     Meds ordered this encounter  Medications  . nitrofurantoin, macrocrystal-monohydrate, (MACROBID) 100 MG capsule    Sig: Take 1 capsule (100 mg total) by mouth 2 (two) times daily.    Dispense:  14 capsule    Refill:  1      F/U  Return for Pt to contact us if symptoms worsen, We will contact her with any abnormal test results. I spent 21 minutes involved in the care of this patient preparing to see the patient by obtaining and reviewing her medical history (including labs, imaging tests and prior procedures), documenting clinical information in the electronic health record (EHR), counseling and coordinating care plans, writing and sending prescriptions, ordering tests or procedures and directly communicating with the patient by discussing pertinent items from her history and physical exam as well as detailing my assessment and plan as noted above so that she has an informed understanding.  All of her questions were answered.  Finis Bud, M.D. 02/18/2019 1:47 PM

## 2019-02-20 LAB — URINE CULTURE

## 2019-03-24 ENCOUNTER — Telehealth: Payer: Self-pay | Admitting: Obstetrics and Gynecology

## 2019-03-24 NOTE — Telephone Encounter (Signed)
Pt called needing to speak to a nurse for advice. She stated she has begin spotting again and would like to discuss this with a nurse.

## 2019-03-25 ENCOUNTER — Other Ambulatory Visit: Payer: Self-pay

## 2019-03-25 ENCOUNTER — Ambulatory Visit: Payer: Medicare PPO | Admitting: Obstetrics and Gynecology

## 2019-03-25 ENCOUNTER — Encounter: Payer: Self-pay | Admitting: Obstetrics and Gynecology

## 2019-03-25 VITALS — BP 143/84 | HR 79 | Ht 66.0 in | Wt 154.7 lb

## 2019-03-25 DIAGNOSIS — R1030 Lower abdominal pain, unspecified: Secondary | ICD-10-CM

## 2019-03-25 DIAGNOSIS — N3001 Acute cystitis with hematuria: Secondary | ICD-10-CM

## 2019-03-25 DIAGNOSIS — N898 Other specified noninflammatory disorders of vagina: Secondary | ICD-10-CM | POA: Diagnosis not present

## 2019-03-25 LAB — POCT URINALYSIS DIPSTICK OB
Bilirubin, UA: NEGATIVE
Glucose, UA: NEGATIVE
Ketones, UA: NEGATIVE
Nitrite, UA: NEGATIVE
POC,PROTEIN,UA: NEGATIVE
Spec Grav, UA: 1.01 (ref 1.010–1.025)
Urobilinogen, UA: 0.2 E.U./dL
pH, UA: 7 (ref 5.0–8.0)

## 2019-03-25 MED ORDER — NITROFURANTOIN MONOHYD MACRO 100 MG PO CAPS: 100.0000 mg | ORAL_CAPSULE | Freq: Two times a day (BID) | ORAL | 1 refills | Status: DC

## 2019-03-25 NOTE — Progress Notes (Addendum)
HPI:      [name] is a 84 y.o. No obstetric history on file. who LMP was No LMP recorded. Patient is postmenopausal.  Subjective:   She presents today stating that she has had bleeding for 3 days.  She is sure that it is coming from the vagina.  She also complains of lower abdominal discomfort similar to when she last had a urinary tract infection. Of significant note patient has a pessary in place. Also of note patient was last treated for urinary tract infection and it was found to be Klebsiella sensitive to nitrofurantoin.  She reports that her abdominal pain at that time resolved with treatment of antibiotics.    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 She does not have any pertinent problems on file. She  has a past surgical history that includes Hernia repair; Tonsillectomy; Thyroidectomy; Total hip arthroplasty; Cataract extraction; Thyroglossal duct cyst; Tonsillectomy; Total hip arthroplasty (Right, 08/26/2014); Eye surgery; Eye surgery; and Total knee arthroplasty (Left, 10/25/2015). Her family history includes Alzheimer's disease in her mother; Breast cancer in her paternal aunt; Cancer in her father; Diabetes in her son; Hypertension in her father; Stroke in her paternal grandfather; Tuberculosis in her paternal grandmother. She  reports that she has never smoked. She has never used smokeless  tobacco. She reports that she does not drink alcohol or use drugs. She has a current medication list which includes the following prescription(s): ascorbic acid, aspirin ec, biotin, calcium-magnesium-zinc, cholecalciferol, ibuprofen, lutein, nitrofurantoin (macrocrystal-monohydrate), oxyquinolone sulfate vaginal, synthroid, tobramycin, valsartan, vitamin a, vitamin b-12, and nitrofurantoin (macrocrystal-monohydrate). She has No Known Allergies.       Review of Systems:  Review of Systems  Constitutional: Denied constitutional symptoms, night sweats, recent illness, fatigue, fever, insomnia and weight loss.  Eyes: Denied eye symptoms, eye pain, photophobia, vision change and visual disturbance.  Ears/Nose/Throat/Neck: Denied ear, nose, throat or neck symptoms, hearing loss, nasal discharge, sinus congestion and sore throat.  Cardiovascular: Denied cardiovascular symptoms, arrhythmia, chest pain/pressure, edema, exercise intolerance, orthopnea and palpitations.  Respiratory: Denied pulmonary symptoms, asthma, pleuritic pain, productive sputum, cough, dyspnea and wheezing.  Gastrointestinal: Denied, gastro-esophageal reflux, melena, nausea and vomiting.  Genitourinary: See HPI for additional information.  Musculoskeletal: Denied musculoskeletal symptoms, stiffness, swelling, muscle weakness and myalgia.  Dermatologic: Denied dermatology symptoms, rash and scar.  Neurologic: Denied neurology symptoms, dizziness, headache, neck pain and syncope.  Psychiatric: Denied psychiatric symptoms, anxiety and depression.  Endocrine: Denied endocrine symptoms including hot flashes and night sweats.   Meds:   Current Outpatient Medications on File Prior to Visit  Medication Sig Dispense Refill  . ascorbic acid (VITAMIN C) 1000 MG tablet Take 1 tablet by mouth daily.    Marland Kitchen aspirin EC 81 MG tablet Take by mouth.    . Biotin 10 MG CAPS Take by mouth.    Marland Kitchen CALCIUM-MAGNESIUM-ZINC PO Take by mouth.    .  cholecalciferol (VITAMIN D) 1000 units tablet Take 1,000 Units by mouth daily.    Marland Kitchen ibuprofen (  ADVIL,MOTRIN) 200 MG tablet Take by mouth.    . Lutein 20 MG CAPS Take by mouth.    . nitrofurantoin, macrocrystal-monohydrate, (MACROBID) 100 MG capsule Take 1 capsule (100 mg total) by mouth 2 (two) times daily. 14 capsule 1  . OXYQUINOLONE SULFATE VAGINAL (TRIMO-SAN) 0.025 % GEL Place vaginally.    Marland Kitchen SYNTHROID 50 MCG tablet Take 1 tablet once daily 30 tablet 5  . tobramycin (TOBREX) 0.3 % ophthalmic solution INSTILL 1 DROP IN LEFT EYE 4X/DAY BEGIN ONE DAY PRIOR TO TREATMENT, DAY OF TREATMENT & 1 DAY AFTER  5  . valsartan (DIOVAN) 80 MG tablet Take 1 tablet (80 mg total) by mouth daily. For blood pressure; this replaces losartan 30 tablet 5  . vitamin A 8000 UNIT capsule Take by mouth.    . vitamin B-12 (CYANOCOBALAMIN) 1000 MCG tablet Take 1,000 mcg by mouth daily.     No current facility-administered medications on file prior to visit.    Objective:     Vitals:   03/25/19 0833  BP: (!) 143/84  Pulse: 79              Physical examination   Pelvic:   Vulva: Normal appearance.  No lesions.  Vagina:  Small vaginal erosion left side.  Small amount of blood noted in the vagina.  Support:  Pelvic relaxation  Urethra No masses tenderness or scarring.  Meatus Normal size without lesions or prolapse.  Cervix: Normal appearance.  No lesions.  Anus: Normal exam.  No lesions.  Perineum: Normal exam.  No lesions.        Bimanual   Uterus: Normal size.  Non-tender.  Mobile.  AV.  Adnexae: No masses.  Non-tender to palpation.  Cul-de-sac: Negative for abnormality.     Assessment:    No obstetric history on file. Patient Active Problem List   Diagnosis Date Noted  . Postoperative hypothyroidism 12/06/2018  . S/P parathyroidectomy 12/06/2018  . Vaginal odor 12/12/2017  . Mixed hyperlipidemia 12/04/2017  . Incomplete uterovaginal prolapse 05/03/2016  . Vitamin D deficiency 03/31/2016  .  OA (osteoarthritis) of knee 10/25/2015  . Nocturia 06/02/2015  . Cystocele, midline 06/02/2015  . Breast cancer screening 03/10/2015  . OA (osteoarthritis) of hip 08/26/2014  . Hypercalcemia 07/06/2014  . Vaginal erosion secondary to pessary use ([place]) 07/01/2014  . Hypothyroid 04/10/2013  . Essential hypertension 04/10/2013  . Arthritis 04/10/2013  . Osteoporosis 04/10/2013     1. Lower abdominal pain   2. Acute cystitis with hematuria   3. Vaginal erosion     Urine likely consistent with UTI.  Patient's abdominal pain also supports this diagnosis.  I also believe that she has a vaginal erosion which has been causing her vaginal bleeding.  Erosion likely secondary to pessary.   Plan:            1.  Removed pessary and will leave out until erosion heals.  If patient continues to have bleeding 2 weeks from now she is to contact us and we will consider pelvic ultrasound/endometrial biopsy as necessary.  Patient is not concerned regarding removal of pessary.  She states that she often leaves it out for several months each year.  2.  Macrobid for suspected Klebsiella UTI. Orders Orders Placed This Encounter  Procedures  . Urine Culture  . POC Urinalysis Dipstick OB    No orders of the defined types were placed in this encounter.     F/U  No follow-ups on file. I spent 23  minutes involved in the care of this patient preparing to see the patient by obtaining and reviewing her medical history (including labs, imaging tests and prior procedures), documenting clinical information in the electronic health record (EHR), counseling and coordinating care plans, writing and sending prescriptions, ordering tests or procedures and directly communicating with the patient by discussing pertinent items from her history and physical exam as well as detailing my assessment and plan as noted above so that she has an informed understanding.  All of her questions were answered.  Finis Bud,  M.D. 03/25/2019 9:34 AM

## 2019-03-28 LAB — URINE CULTURE

## 2019-04-11 ENCOUNTER — Encounter: Payer: Medicare Other | Admitting: Obstetrics and Gynecology

## 2019-05-15 NOTE — Progress Notes (Signed)
Patient ID: Haley Schneider, female    DOB: 1935/10/20, 84 y.o.   MRN: [id number]  PCP: Hubbard Hartshorn, FNP  Chief Complaint  Patient presents with  . Back Pain    [place] one week ago in garden, has had lower back pain ever since    Subjective:   Haley Schneider is a 84 y.o. female, presents to clinic with CC of the following:  Chief Complaint  Patient presents with  . Back Pain    [place] one week ago in garden, has had lower back pain ever since    HPI:  Patient is an 84 year old female patient of Raelyn Ensign Her last visit with Raquel Sarna was in early December She has a known history of the following Hypothyroidism - 59mcg synthroid- stable for many years Lab Results  Component Value Date   TSH 1.25 12/06/2018    HTN: She is at goal today. Taking Valsartan 80mg  and doing well on this.  BP Readings from Last 3 Encounters:  05/16/19 130/90  03/25/19 (!) 143/84  02/18/19 (!) 167/84   Osteoporosis/Vitamin D deficiency: She took herself off of the fossamax because the pills were too hard to swallow. She is taking vitamin D supplement at 1000IU. She stopped seeing Dr. Gabriel Carina and does not want to go back. Discussed safety and avoiding falls.  Due for bone density scan - she would consider Prolia after bone density scan if needed. Last vitamin D Lab Results  Component Value Date   VD25OH 30 12/06/2018   [id number] not want to take statin therapy, and with her advanced age, I do not recommend.  Hypercalcemia/Parathyroid Hormone Elevation: Saw Dr. Gabriel Carina and had parathyroid removed in 2019. Calcium had returned to normal after that.  A bone scan done on 02/05/2019 was consistent with osteoporosis: ASSESSMENT:  The BMD measured at Forearm Radius 33% is 0.581 g/cm2 with a T-score  of -3.4.  This patient is considered OSTEOPOROTIC according to [facility]) criteria.  L-4 was excluded due to degenerative changes.  The right and left femurs were excluded  due to surgical hardware.    She presents today with back pain. She was working in the garden a week ago, and as it was getting to the desk, she noted she has bricks there and fell and she did not hit her head.  After, her back was painful, and she notes the back pain has continued for the past week.  She feels it across her lower back, and also in the muscles on both sides of her back.  She notes last night she took 4 ibuprofen, and did help a little.  She normally takes 3 ibuprofen at a time when she takes that medicine. She notes she has a pessary, and has that out now for a break, with some incontinence noted without the pessary in place.  No bowel incontinence.  Denies any dysuria, any marked frequency, no obvious blood in the urine.  She has not had fevers. She has been using a cane to help her get around for support. She denies any pains that go down the legs at all.  No numbness or tingling or weakness in the lower extremities.  No foot drop.  No saddle anesthesia. As above noted, she has a history of osteoporosis and stopped the Fosamax product in her past, and now takes a product with vitamin D, calcium, and zinc. She has had 2 hip replacements and a knee replacement in  her past as well.  Patient Active Problem List   Diagnosis Date Noted  . Postoperative hypothyroidism 12/06/2018  . S/P parathyroidectomy 12/06/2018  . Vaginal odor 12/12/2017  . Mixed hyperlipidemia 12/04/2017  . Incomplete uterovaginal prolapse 05/03/2016  . Vitamin D deficiency 03/31/2016  . OA (osteoarthritis) of knee 10/25/2015  . Nocturia 06/02/2015  . Cystocele, midline 06/02/2015  . Breast cancer screening 03/10/2015  . OA (osteoarthritis) of hip 08/26/2014  . Hypercalcemia 07/06/2014  . Vaginal erosion secondary to pessary use ([place]) 07/01/2014  . Hypothyroid 04/10/2013  . Essential hypertension 04/10/2013  . Arthritis 04/10/2013  . Osteoporosis 04/10/2013      Current Outpatient Medications:  .   ascorbic acid (VITAMIN C) 1000 MG tablet, Take 1 tablet by mouth daily., Disp: , Rfl:  .  aspirin EC 81 MG tablet, Take by mouth., Disp: , Rfl:  .  Biotin 10 MG CAPS, Take by mouth., Disp: , Rfl:  .  CALCIUM-MAGNESIUM-ZINC PO, Take by mouth., Disp: , Rfl:  .  cholecalciferol (VITAMIN D) 1000 units tablet, Take 1,000 Units by mouth daily., Disp: , Rfl:  .  ibuprofen (ADVIL,MOTRIN) 200 MG tablet, Take by mouth., Disp: , Rfl:  .  Lutein 20 MG CAPS, Take by mouth., Disp: , Rfl:  .  SYNTHROID 50 MCG tablet, Take 1 tablet once daily, Disp: 30 tablet, Rfl: 5 .  valsartan (DIOVAN) 80 MG tablet, Take 1 tablet (80 mg total) by mouth daily. For blood pressure; this replaces losartan, Disp: 30 tablet, Rfl: 5 .  vitamin A 8000 UNIT capsule, Take by mouth., Disp: , Rfl:  .  vitamin B-12 (CYANOCOBALAMIN) 1000 MCG tablet, Take 1,000 mcg by mouth daily., Disp: , Rfl:  .  OXYQUINOLONE SULFATE VAGINAL (TRIMO-SAN) 0.025 % GEL, Place vaginally., Disp: , Rfl:    No Known Allergies   Past Surgical History:  Procedure Laterality Date  . CATARACT EXTRACTION    . EYE SURGERY     cataract surgery bilat   . EYE SURGERY     laser eye surgery left eye"retinal acclution"laser  . HERNIA REPAIR    . THYROGLOSSAL DUCT CYST    . THYROIDECTOMY    . TONSILLECTOMY    . TONSILLECTOMY    . TOTAL HIP ARTHROPLASTY     left hip 7 years ago   . TOTAL HIP ARTHROPLASTY Right 08/26/2014   Procedure: RIGHT TOTAL HIP ARTHROPLASTY ANTERIOR APPROACH;  Surgeon: Gaynelle Arabian, MD;  Location: [facility];  Service: Orthopedics;  Laterality: Right;  . TOTAL KNEE ARTHROPLASTY Left 10/25/2015   Procedure: LEFT TOTAL KNEE ARTHROPLASTY;  Surgeon: Gaynelle Arabian, MD;  Location: [facility];  Service: Orthopedics;  Laterality: Left;     Family History  Problem Relation Age of Onset  . Alzheimer's disease Mother   . Cancer Father        lung  . Hypertension Father   . Stroke Paternal Grandfather   . Tuberculosis Paternal Grandmother   .  Diabetes Son   . Breast cancer Paternal Aunt   . Heart disease Neg Hx      Social History   Tobacco Use  . Smoking status: Never Smoker  . Smokeless tobacco: Never Used  Substance Use Topics  . Alcohol use: No    With staff assistance, above reviewed with the patient today.  ROS: As per HPI, otherwise no specific complaints on a limited and focused system review   No results found for this or any previous visit (from the past 72 hour(s)).  PHQ2/9: Depression screen [facility] 2/9 02/04/2019 12/06/2018 06/06/2018 12/04/2017 05/30/2017  Decreased Interest 0 0 0 0 0  Down, Depressed, Hopeless 0 0 0 0 0  PHQ - 2 Score 0 0 0 0 0  Altered sleeping - 0 0 2 -  Tired, decreased energy - 0 0 1 -  Change in appetite - 0 0 0 -  Feeling bad or failure about yourself  - 0 0 0 -  Trouble concentrating - 0 0 0 -  Moving slowly or fidgety/restless - 0 0 0 -  Suicidal thoughts - 0 0 0 -  PHQ-9 Score - 0 0 3 -  Difficult doing work/chores - Not difficult at all Not difficult at all - -   PHQ-2/9 Result is   Fall Risk: Fall Risk  02/04/2019 12/06/2018 06/06/2018 12/04/2017 05/30/2017  Falls in the past year? 0 0 0 0 No  Number falls in past yr: 0 0 0 0 -  Injury with Fall? 0 0 0 - -  Risk for fall due to : No Fall Risks - - - -  Follow up Falls prevention discussed Falls evaluation completed Falls evaluation completed - -      Objective:   Vitals:   05/16/19 1400  BP: 130/90  Pulse: 86  Resp: 16  Temp: 97.9 F (36.6 C)  TempSrc: Temporal  SpO2: 96%  Weight: 152 lb 3.2 oz (69 kg)  Height: 5\' 6"  (1.676 m)    Body mass index is 24.57 kg/m.  Physical Exam   NAD, masked, pleasant She was able to get up from the chair and over to the exam table and up onto the exam table with assistance with the step onto the table. HEENT - [place]/AT, sclera anicteric,  Neck - supple, Car - RRR without m/g/r Pulm- RR and effort normal at rest, CTA without wheeze or rales Abd - soft, NT, ND, no masses Back -  no CVA tenderness,  Mildly tender with palpation in lower back, more adjacent to and lateral to lower lumbar spine  bilateral, with her tenderness most marked in the SI joint regions bilateral. Very minimally tender over the lower lumbar/sacral spine proper,  No focal CVA tenderness No bruising or swelling             SLR negative,             No discomfort with bringing right and left knee to chest when supine  Good strength in LE's on testing including with dorsi and plantar flexion of feet bilateral             Sensation intact to LT in bilteral LE's distal             DTR's 2+ and = in patella and achilles Ext - no LE edema,  Neuro/psychiatric - affect was not flat, appropriate with conversation  Alert and oriented  Speech normal   Could walk from her chair to the exam table without a cane without incident, could remain balanced standing without assistance,      Assessment & Plan:   1. Acute bilateral low back pain without sciatica Do feel there is a strong mechanical component, with her discomfort noted a lot in the muscle beds adjacent to the lumbar spine bilateral.  With her osteoporosis history, and the fall noted, do feel x-rays are needed to ensure there is not a fracture concern.  Encouraged her no radicular signs or symptoms down the lower extremities.  The urinary incontinence  is likely related to her not having a pessary per her history.  Discussed possibly getting a urine sample to rule out an infectious concern, and she did not think she could leave a sample today, and has no other symptoms of concern for infection when asked. We will check x-rays, and she was unsure if she could get there today, and emphasized that she could not to hopefully get there very early next week at the latest We will add a muscle relaxer, with Zanaflex prescribed, can take up to 3 times a day as needed, and warned may make drowsy and not to drive after taking. Also noted concerns with the ibuprofen  use, especially the higher dose of 800 mg, as it can affect her blood pressure as well as kidney function with more chronic use. She notes Tylenol does not help her at all, and thus, can use the ibuprofen products with encouragement to use no more than 600 mg at a time, and no more than 3 times a day, taking with food.  The less she needs this to better was emphasized. Emphasized local measures with the contrast therapy approach, with warm compresses, followed by some gentle range of motion exercises, ending with some cold to help lessen inflammation. - DG Lumbar Spine Complete; Future - DG Pelvis Comp Min 3V; Future  2. Essential hypertension Do feel her blood pressure was slightly higher today likely due to the pain, and she has been taking her blood pressure medicine.  To continue presently.  3. Age-related osteoporosis without current pathological fracture Discussed her osteoporosis, and other medications that may be helpful to add to help prevent fracture concerns in the future.  Mentioned a medicine like Prolia to consider, and are others as well that may be considered over time.  She noted the side effects of these types of medicines and was very hesitant to rush to add them presently. - DG Lumbar Spine Complete; Future - DG Pelvis Comp Min 3V; Future  4. Postoperative hypothyroidism  TSH has been good on last check, in December 2020  5. Vitamin D deficiency Last vitamin D level was okay, and she does take a supplement with vitamin D, calcium, and zinc.  To follow-up if symptoms are not improving or worsening, and await the x-ray result presently.    Towanda Malkin, MD 05/16/19 2:10 PM

## 2019-05-16 ENCOUNTER — Ambulatory Visit: Payer: Medicare PPO | Admitting: Internal Medicine

## 2019-05-16 ENCOUNTER — Other Ambulatory Visit: Payer: Self-pay

## 2019-05-16 ENCOUNTER — Encounter: Payer: Self-pay | Admitting: Internal Medicine

## 2019-05-16 VITALS — BP 130/90 | HR 86 | Temp 97.9°F | Resp 16 | Ht 66.0 in | Wt 152.2 lb

## 2019-05-16 DIAGNOSIS — M545 Low back pain, unspecified: Secondary | ICD-10-CM

## 2019-05-16 DIAGNOSIS — I10 Essential (primary) hypertension: Secondary | ICD-10-CM

## 2019-05-16 DIAGNOSIS — E890 Postprocedural hypothyroidism: Secondary | ICD-10-CM

## 2019-05-16 DIAGNOSIS — E559 Vitamin D deficiency, unspecified: Secondary | ICD-10-CM

## 2019-05-16 DIAGNOSIS — M810 Age-related osteoporosis without current pathological fracture: Secondary | ICD-10-CM

## 2019-05-16 MED ORDER — TIZANIDINE HCL 2 MG PO CAPS: 2.0000 mg | ORAL_CAPSULE | Freq: Three times a day (TID) | ORAL | 1 refills | Status: DC | PRN

## 2019-05-16 NOTE — Patient Instructions (Signed)
Acute Back Pain, Adult Acute back pain is sudden and usually short-lived. It is often caused by an injury to the muscles and tissues in the back. The injury may result from:  A muscle or ligament getting overstretched or torn (strained). Ligaments are tissues that connect bones to each other. Lifting something improperly can cause a back strain.  Wear and tear (degeneration) of the spinal disks. Spinal disks are circular tissue that provides cushioning between the bones of the spine (vertebrae).  Twisting motions, such as while playing sports or doing yard work.  A hit to the back.  Arthritis. You may have a physical exam, lab tests, and imaging tests to find the cause of your pain. Acute back pain usually goes away with rest and home care. Follow these instructions at home: Managing pain, stiffness, and swelling  Take over-the-counter and prescription medicines only as told by your health care provider.  Your health care provider may recommend applying ice during the first 24-48 hours after your pain starts. To do this: ? Put ice in a plastic bag. ? Place a towel between your skin and the bag. ? Leave the ice on for 20 minutes, 2-3 times a day.  If directed, apply heat to the affected area as often as told by your health care provider. Use the heat source that your health care provider recommends, such as a moist heat pack or a heating pad. ? Place a towel between your skin and the heat source. ? Leave the heat on for 20-30 minutes. ? Remove the heat if your skin turns bright red. This is especially important if you are unable to feel pain, heat, or cold. You have a greater risk of getting burned. Activity   Do not stay in bed. Staying in bed for more than 1-2 days can delay your recovery.  Sit up and stand up straight. Avoid leaning forward when you sit, or hunching over when you stand. ? If you work at a desk, sit close to it so you do not need to lean over. Keep your chin tucked  in. Keep your neck drawn back, and keep your elbows bent at a right angle. Your arms should look like the letter "L." ? Sit high and close to the steering wheel when you drive. Add lower back (lumbar) support to your car seat, if needed.  Take short walks on even surfaces as soon as you are able. Try to increase the length of time you walk each day.  Do not sit, drive, or stand in one place for more than 30 minutes at a time. Sitting or standing for long periods of time can put stress on your back.  Do not drive or use heavy machinery while taking prescription pain medicine.  Use proper lifting techniques. When you bend and lift, use positions that put less stress on your back: ? Bend your knees. ? Keep the load close to your body. ? Avoid twisting.  Exercise regularly as told by your health care provider. Exercising helps your back heal faster and helps prevent back injuries by keeping muscles strong and flexible.  Work with a physical therapist to make a safe exercise program, as recommended by your health care provider. Do any exercises as told by your physical therapist. Lifestyle  Maintain a healthy weight. Extra weight puts stress on your back and makes it difficult to have good posture.  Avoid activities or situations that make you feel anxious or stressed. Stress and anxiety increase muscle   tension and can make back pain worse. Learn ways to manage anxiety and stress, such as through exercise. General instructions  Sleep on a firm mattress in a comfortable position. Try lying on your side with your knees slightly bent. If you lie on your back, put a pillow under your knees.  Follow your treatment plan as told by your health care provider. This may include: ? Cognitive or behavioral therapy. ? Acupuncture or massage therapy. ? Meditation or yoga. Contact a health care provider if:  You have pain that is not relieved with rest or medicine.  You have increasing pain going down  into your legs or buttocks.  Your pain does not improve after 2 weeks.  You have pain at night.  You lose weight without trying.  You have a fever or chills. Get help right away if:  You develop new bowel or bladder control problems.  You have unusual weakness or numbness in your arms or legs.  You develop nausea or vomiting.  You develop abdominal pain.  You feel faint. Summary  Acute back pain is sudden and usually short-lived.  Use proper lifting techniques. When you bend and lift, use positions that put less stress on your back.  Take over-the-counter and prescription medicines and apply heat or ice as directed by your health care provider. This information is not intended to replace advice given to you by your health care provider. Make sure you discuss any questions you have with your health care provider. Document Revised: 04/09/2018 Document Reviewed: 08/02/2016 [employer]vier Patient Education  2020 [employer]  

## 2019-05-19 ENCOUNTER — Ambulatory Visit
Admission: RE | Admit: 2019-05-19 | Discharge: 2019-05-19 | Disposition: A | Discharge status: Home / Self Care | Payer: Medicare PPO | Source: Ambulatory Visit | Attending: Internal Medicine | Admitting: Internal Medicine

## 2019-05-19 ENCOUNTER — Other Ambulatory Visit: Payer: Self-pay

## 2019-05-19 ENCOUNTER — Telehealth: Payer: Self-pay | Admitting: Family Medicine

## 2019-05-19 ENCOUNTER — Ambulatory Visit
Admission: RE | Admit: 2019-05-19 | Discharge: 2019-05-19 | Disposition: A | Discharge status: Home / Self Care | Payer: Medicare PPO | Attending: Internal Medicine | Admitting: Internal Medicine

## 2019-05-19 DIAGNOSIS — M810 Age-related osteoporosis without current pathological fracture: Secondary | ICD-10-CM | POA: Insufficient documentation

## 2019-05-19 DIAGNOSIS — M545 Low back pain, unspecified: Secondary | ICD-10-CM

## 2019-05-19 NOTE — Telephone Encounter (Signed)
Tiffany called back again to ask about this order.  Pt has come back a 2nd time to get this done. Advised Dr Roxan Hockey not in the office today, and his nurse has left.

## 2019-05-19 NOTE — Telephone Encounter (Signed)
Dr Roxan Hockey ordered the test

## 2019-05-19 NOTE — Telephone Encounter (Signed)
Tiffany from [facility]- outpatient imaging in [place] called  concerning DG Pelvis Comp MIN 3V. Tiffany  stated that they normally do a 1V, if doctor wants 3V he needs to specify which 3V he wants

## 2019-05-20 NOTE — Telephone Encounter (Signed)
Saw the results of the lumbar spine film. At this point, can hold on the pelvis film and if not clinically improving, will need to refer to orthopedics. Thanks.

## 2019-05-20 NOTE — Telephone Encounter (Signed)
Tiffany from [facility]- outpatient imaging in [place] calling again concerning DG Pelvis Comp MIN 3V

## 2019-06-06 ENCOUNTER — Ambulatory Visit: Payer: Medicare Other | Admitting: Family Medicine

## 2019-06-17 ENCOUNTER — Other Ambulatory Visit: Payer: Self-pay | Admitting: Family Medicine

## 2019-06-17 DIAGNOSIS — E890 Postprocedural hypothyroidism: Secondary | ICD-10-CM

## 2019-06-17 NOTE — Telephone Encounter (Signed)
Requested medication (s) are due for refill today: yes  Requested medication (s) are on the active medication list: yes  Last refill: 12/06/18  #30 5 refills  Future visit scheduled: no  Notes to clinic: Patient has been seen by Dr Roxan Hockey acute visit. Raelyn Ensign is still listed as PCP    Requested Prescriptions  Pending Prescriptions Disp Refills   SYNTHROID 50 MCG tablet 30 tablet 5    Sig: Take 1 tablet once daily      Endocrinology:  Hypothyroid Agents Failed - 06/17/2019  3:56 PM      Failed - TSH needs to be rechecked within 3 months after an abnormal result. Refill until TSH is due.      Passed - TSH in normal range and within 360 days    TSH  Date Value Ref Range Status  12/06/2018 1.25 0.40 - 4.50 mIU/L Final          Passed - Valid encounter within last 12 months    Recent Outpatient Visits           1 month ago Acute bilateral low back pain without sciatica   [place], MD   6 months ago Postoperative hypothyroidism   [place], [place]   1 year ago Postoperative hypothyroidism   [place], [place], FNP   1 year ago Essential hypertension   [place], Satira Anis, MD   2 years ago Essential hypertension   [place], Satira Anis, MD       Future Appointments             In 7 months [facility], [facility]

## 2019-06-17 NOTE — Telephone Encounter (Signed)
Medication Refill - Medication:  SYNTHROID 50 MCG tablet [[id number]]  Pt is completely out of the meds.  She needs refill asap  Has the patient contacted their pharmacy? No. (Agent: If no, request that the patient contact the pharmacy for the refill.) (Agent: If yes, when and what did the pharmacy advise?)  Preferred Pharmacy (with phone number or street name): [facility] [place]   Agent: Please be advised that RX refills may take up to 3 business days. We ask that you follow-up with your pharmacy.

## 2019-06-18 ENCOUNTER — Encounter: Payer: Medicare PPO | Admitting: Obstetrics and Gynecology

## 2019-06-18 MED ORDER — SYNTHROID 50 MCG PO TABS: ORAL_TABLET | ORAL | 3 refills | Status: DC

## 2019-06-19 ENCOUNTER — Telehealth: Payer: Self-pay | Admitting: Family Medicine

## 2019-06-19 NOTE — Telephone Encounter (Signed)
error 

## 2019-06-25 ENCOUNTER — Encounter: Payer: Self-pay | Admitting: Obstetrics and Gynecology

## 2019-06-25 ENCOUNTER — Encounter: Payer: Medicare PPO | Admitting: Obstetrics and Gynecology

## 2019-06-25 ENCOUNTER — Other Ambulatory Visit: Payer: Self-pay

## 2019-06-25 ENCOUNTER — Ambulatory Visit: Payer: Medicare PPO | Admitting: Obstetrics and Gynecology

## 2019-06-25 VITALS — BP 158/91 | HR 96 | Ht 66.0 in | Wt 146.4 lb

## 2019-06-25 DIAGNOSIS — Z4689 Encounter for fitting and adjustment of other specified devices: Secondary | ICD-10-CM | POA: Diagnosis not present

## 2019-06-25 DIAGNOSIS — N898 Other specified noninflammatory disorders of vagina: Secondary | ICD-10-CM | POA: Diagnosis not present

## 2019-06-25 DIAGNOSIS — M810 Age-related osteoporosis without current pathological fracture: Secondary | ICD-10-CM | POA: Diagnosis not present

## 2019-06-25 NOTE — Progress Notes (Signed)
HPI:      [name] is a 84 y.o. No obstetric history on file. who LMP was No LMP recorded. Patient is postmenopausal.  Subjective:   She presents today for follow-up of pessary use.  She has pelvic relaxation but was having vaginal bleeding from a vaginal erosion secondary to her pessary.  We have now left the pessary out for 3 months.  She reports no further vaginal bleeding.  She would like the pessary replaced. Additionally we discussed osteoporosis.  She states that she is having back pain and is known to have osteoporosis.  She took Fosamax but stated that it caused side effect of joint pain so she discontinued it.  She is open to the possibility of taking a different medication as long as she does not experience side effects.    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 She does not have any pertinent problems on file. She  has a past surgical history that includes Hernia repair; Tonsillectomy; Thyroidectomy; Total hip arthroplasty; Cataract extraction; Thyroglossal duct cyst; Tonsillectomy; Total hip arthroplasty (Right, 08/26/2014); Eye surgery; Eye surgery; and Total knee arthroplasty (Left, 10/25/2015). Her family history includes Alzheimer's disease in her mother; Breast cancer in her paternal aunt; Cancer in her father; Diabetes in her son; Hypertension in her father; Stroke in her paternal grandfather;  Tuberculosis in her paternal grandmother. She  reports that she has never smoked. She has never used smokeless tobacco. She reports that she does not drink alcohol and does not use drugs. She has a current medication list which includes the following prescription(s): ascorbic acid, aspirin ec, biotin, calcium-magnesium-zinc, cholecalciferol, ibuprofen, lutein, oxyquinolone sulfate vaginal, synthroid, tizanidine, valsartan, vitamin a, and vitamin b-12. She has No Known Allergies.       Review of Systems:  Review of Systems  Constitutional: Denied constitutional symptoms, night sweats, recent illness, fatigue, fever, insomnia and weight loss.  Eyes: Denied eye symptoms, eye pain, photophobia, vision change and visual disturbance.  Ears/Nose/Throat/Neck: Denied ear, nose, throat or neck symptoms, hearing loss, nasal discharge, sinus congestion and sore throat.  Cardiovascular: Denied cardiovascular symptoms, arrhythmia, chest pain/pressure, edema, exercise intolerance, orthopnea and palpitations.  Respiratory: Denied pulmonary symptoms, asthma, pleuritic pain, productive sputum, cough, dyspnea and wheezing.  Gastrointestinal: Denied, gastro-esophageal reflux, melena, nausea and vomiting.  Genitourinary: Denied genitourinary symptoms including symptomatic vaginal discharge, pelvic relaxation issues, and urinary complaints.  Musculoskeletal: Denied musculoskeletal symptoms, stiffness, swelling, muscle weakness and myalgia.  Dermatologic: Denied dermatology symptoms, rash and scar.  Neurologic: Denied neurology symptoms, dizziness, headache, neck pain and syncope.  Psychiatric: Denied psychiatric symptoms, anxiety and depression.  Endocrine: Denied endocrine symptoms including hot flashes and night sweats.   Meds:   Current Outpatient Medications on File Prior to Visit  Medication Sig Dispense Refill   ascorbic acid (VITAMIN C) 1000 MG tablet Take 1 tablet by mouth daily.     aspirin EC 81 MG  tablet Take by mouth.     Biotin 10 MG CAPS Take by mouth.  CALCIUM-MAGNESIUM-ZINC PO Take by mouth.     cholecalciferol (VITAMIN D) 1000 units tablet Take 1,000 Units by mouth daily.     ibuprofen (ADVIL,MOTRIN) 200 MG tablet Take by mouth.     Lutein 20 MG CAPS Take by mouth.     OXYQUINOLONE SULFATE VAGINAL (TRIMO-SAN) 0.025 % GEL Place vaginally.     SYNTHROID 50 MCG tablet Take 1 tablet once daily 90 tablet 3   tizanidine (ZANAFLEX) 2 MG capsule Take 1 capsule (2 mg total) by mouth 3 (three) times daily as needed for muscle spasms. May make drowsy, not to drive while taking. 30 capsule 1   valsartan (DIOVAN) 80 MG tablet Take 1 tablet (80 mg total) by mouth daily. For blood pressure; this replaces losartan 30 tablet 5   vitamin A 8000 UNIT capsule Take by mouth.     vitamin B-12 (CYANOCOBALAMIN) 1000 MCG tablet Take 1,000 mcg by mouth daily.     No current facility-administered medications on file prior to visit.    Objective:     Vitals:   06/25/19 1059  BP: (!) 158/91  Pulse: 96              Speculum examination reveals no further vaginal erosions.  Vagina looks healthy pink and not significantly atrophic.  Pessary Care Pessary replaced without problem    Assessment:    No obstetric history on file. Patient Active Problem List   Diagnosis Date Noted   Postoperative hypothyroidism 12/06/2018   S/P parathyroidectomy 12/06/2018   Vaginal odor 12/12/2017   Mixed hyperlipidemia 12/04/2017   Incomplete uterovaginal prolapse 05/03/2016   Vitamin D deficiency 03/31/2016   OA (osteoarthritis) of knee 10/25/2015   Nocturia 06/02/2015   Cystocele, midline 06/02/2015   Breast cancer screening 03/10/2015   OA (osteoarthritis) of hip 08/26/2014   Hypercalcemia 07/06/2014   Vaginal erosion secondary to pessary use ([place]) 07/01/2014   Hypothyroid 04/10/2013   Essential hypertension 04/10/2013   Arthritis 04/10/2013   Osteoporosis 04/10/2013      1. Vaginal erosion   2. Pessary maintenance   3. Age-related osteoporosis without current pathological fracture     Vaginal erosion resolved.   Plan:            1.  Follow-up in 3 months for pessary care  2.  We will discuss treatment for osteoporosis further at next visit.  Perhaps patient will be willing to try a different medication other than Fosamax.  Orders No orders of the defined types were placed in this encounter.   No orders of the defined types were placed in this encounter.     F/U  No follow-ups on file. I spent 23 minutes involved in the care of this patient preparing to see the patient by obtaining and reviewing her medical history (including labs, imaging tests and prior procedures), documenting clinical information in the electronic health record (EHR), counseling and coordinating care plans, writing and sending prescriptions, ordering tests or procedures and directly communicating with the patient by discussing pertinent items from her history and physical exam as well as detailing my assessment and plan as noted above so that she has an informed understanding.  All of her questions were answered.  Finis Bud, M.D. 06/25/2019 11:37 AM

## 2019-08-06 DIAGNOSIS — H35373 Puckering of macula, bilateral: Secondary | ICD-10-CM | POA: Diagnosis not present

## 2019-08-06 DIAGNOSIS — H43813 Vitreous degeneration, bilateral: Secondary | ICD-10-CM | POA: Diagnosis not present

## 2019-08-06 DIAGNOSIS — H34832 Tributary (branch) retinal vein occlusion, left eye, with macular edema: Secondary | ICD-10-CM | POA: Diagnosis not present

## 2019-08-06 DIAGNOSIS — H43391 Other vitreous opacities, right eye: Secondary | ICD-10-CM | POA: Diagnosis not present

## 2019-09-25 ENCOUNTER — Other Ambulatory Visit: Payer: Self-pay

## 2019-09-25 ENCOUNTER — Ambulatory Visit: Payer: Medicare PPO | Admitting: Obstetrics and Gynecology

## 2019-09-25 ENCOUNTER — Encounter: Payer: Self-pay | Admitting: Obstetrics and Gynecology

## 2019-09-25 VITALS — BP 158/96 | HR 80 | Ht 66.0 in | Wt 147.0 lb

## 2019-09-25 DIAGNOSIS — Z4689 Encounter for fitting and adjustment of other specified devices: Secondary | ICD-10-CM | POA: Diagnosis not present

## 2019-09-25 DIAGNOSIS — M810 Age-related osteoporosis without current pathological fracture: Secondary | ICD-10-CM | POA: Diagnosis not present

## 2019-09-25 MED ORDER — RISEDRONATE SODIUM 30 MG PO TABS: 30.0000 mg | ORAL_TABLET | ORAL | 1 refills | Status: AC

## 2019-09-25 NOTE — Progress Notes (Signed)
HPI:      [name] is a 84 y.o. No obstetric history on file. who LMP was No LMP recorded. Patient is postmenopausal.  Subjective:   She presents today for pessary care.  Patient states that she has not had any bleeding and has no problems with her pessary. Additionally we previously decided to discuss her osteoporosis.  She had some joint aches with Fosamax and would like to try different medication.    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 She does not have any pertinent problems on file. She  has a past surgical history that includes Hernia repair; Tonsillectomy; Thyroidectomy; Total hip arthroplasty; Cataract extraction; Thyroglossal duct cyst; Tonsillectomy; Total hip arthroplasty (Right, 08/26/2014); Eye surgery; Eye surgery; and Total knee arthroplasty (Left, 10/25/2015). Her family history includes Alzheimer's disease in her mother; Breast cancer in her paternal aunt; Cancer in her father; Diabetes in her son; Hypertension in her father; Stroke in her paternal grandfather; Tuberculosis in her paternal grandmother. She  reports that she has never smoked. She has never used smokeless tobacco. She reports that she does not drink alcohol and does not use drugs. She has a current medication list which includes the following prescription(s): ascorbic acid, aspirin ec, biotin, calcium-magnesium-zinc,  cholecalciferol, ibuprofen, lutein, oxyquinolone sulfate vaginal, risedronate, synthroid, tizanidine, valsartan, vitamin a, and vitamin b-12. She has No Known Allergies.       Review of Systems:  Review of Systems  Constitutional: Denied constitutional symptoms, night sweats, recent illness, fatigue, fever, insomnia and weight loss.  Eyes: Denied eye symptoms, eye pain, photophobia, vision change and visual disturbance.  Ears/Nose/Throat/Neck: Denied ear, nose, throat or neck symptoms, hearing loss, nasal discharge, sinus congestion and sore throat.  Cardiovascular: Denied cardiovascular symptoms, arrhythmia, chest pain/pressure, edema, exercise intolerance, orthopnea and palpitations.  Respiratory: Denied pulmonary symptoms, asthma, pleuritic pain, productive sputum, cough, dyspnea and wheezing.  Gastrointestinal: Denied, gastro-esophageal reflux, melena, nausea and vomiting.  Genitourinary: Denied genitourinary symptoms including symptomatic vaginal discharge, pelvic relaxation issues, and urinary complaints.  Musculoskeletal: Denied musculoskeletal symptoms, stiffness, swelling, muscle weakness and myalgia.  Dermatologic: Denied dermatology symptoms, rash and scar.  Neurologic: Denied neurology symptoms, dizziness, headache, neck pain and syncope.  Psychiatric: Denied psychiatric symptoms, anxiety and depression.  Endocrine: Denied endocrine symptoms including hot flashes and night sweats.   Meds:   Current Outpatient Medications on File Prior to Visit  Medication Sig Dispense Refill  . ascorbic acid (VITAMIN C) 1000 MG tablet Take 1 tablet by mouth daily.    Marland Kitchen aspirin EC 81 MG tablet Take by mouth.    . Biotin 10 MG CAPS Take by mouth.    Marland Kitchen CALCIUM-MAGNESIUM-ZINC PO Take by mouth.    . cholecalciferol (VITAMIN D) 1000 units tablet Take 1,000 Units by mouth daily.    Marland Kitchen ibuprofen (ADVIL,MOTRIN) 200 MG tablet Take by mouth.    . Lutein 20 MG CAPS Take by mouth.    Levin Erp  SULFATE VAGINAL (TRIMO-SAN) 0.025 % GEL Place vaginally.    Marland Kitchen  SYNTHROID 50 MCG tablet Take 1 tablet once daily 90 tablet 3  . tizanidine (ZANAFLEX) 2 MG capsule Take 1 capsule (2 mg total) by mouth 3 (three) times daily as needed for muscle spasms. May make drowsy, not to drive while taking. 30 capsule 1  . valsartan (DIOVAN) 80 MG tablet Take 1 tablet (80 mg total) by mouth daily. For blood pressure; this replaces losartan 30 tablet 5  . vitamin A 8000 UNIT capsule Take by mouth.    . vitamin B-12 (CYANOCOBALAMIN) 1000 MCG tablet Take 1,000 mcg by mouth daily.     No current facility-administered medications on file prior to visit.    Objective:     Vitals:   09/25/19 1133  BP: (!) 158/96  Pulse: 80              Pessary Care Pessary removed and cleaned.  Vagina checked by speculum exam- without erosions - pessary replaced.    Assessment:    No obstetric history on file. Patient Active Problem List   Diagnosis Date Noted  . Postoperative hypothyroidism 12/06/2018  . S/P parathyroidectomy 12/06/2018  . Vaginal odor 12/12/2017  . Mixed hyperlipidemia 12/04/2017  . Incomplete uterovaginal prolapse 05/03/2016  . Vitamin D deficiency 03/31/2016  . OA (osteoarthritis) of knee 10/25/2015  . Nocturia 06/02/2015  . Cystocele, midline 06/02/2015  . Breast cancer screening 03/10/2015  . OA (osteoarthritis) of hip 08/26/2014  . Hypercalcemia 07/06/2014  . Vaginal erosion secondary to pessary use ([place]) 07/01/2014  . Hypothyroid 04/10/2013  . Essential hypertension 04/10/2013  . Arthritis 04/10/2013  . Osteoporosis 04/10/2013     1. Pessary maintenance   2. Age-related osteoporosis without current pathological fracture        Plan:            1.  Discussed newest literature detailing that pessaries could be changed and cleaned every 6 months.  Patient is very interested.  2.  Discussed multiple options for osteoporosis.  Use of at least 1200 mg of calcium and associated  vitamin D discussed.  3.  We will try a different medication Actonel for osteoporosis.  If patient continues to have side effects, she should consider every 6 month injections through her family physician because prior approval is required.  Orders No orders of the defined types were placed in this encounter.    Meds ordered this encounter  Medications  . risedronate (ACTONEL) 30 MG tablet    Sig: Take 1 tablet (30 mg total) by mouth every 7 (seven) days for 16 doses. with water on empty stomach, nothing by mouth or lie down for next 30 minutes.    Dispense:  16 tablet    Refill:  1      F/U  Return in about 6 months (around 03/24/2020). I spent 23 minutes involved in the care of this patient preparing to see the patient by obtaining and reviewing her medical history (including labs, imaging tests and prior procedures), documenting clinical information in the electronic health record (EHR), counseling and coordinating care plans, writing and sending prescriptions, ordering tests or procedures and directly communicating with the patient by discussing pertinent items from her history and physical exam as well as detailing my assessment and plan as noted above so that she has an informed understanding.  All of her questions were answered. Finis Bud, M.D. 09/25/2019 12:01 PM

## 2019-09-29 DIAGNOSIS — L821 Other seborrheic keratosis: Secondary | ICD-10-CM | POA: Diagnosis not present

## 2019-09-29 DIAGNOSIS — L720 Epidermal cyst: Secondary | ICD-10-CM | POA: Diagnosis not present

## 2019-09-29 DIAGNOSIS — L570 Actinic keratosis: Secondary | ICD-10-CM | POA: Diagnosis not present

## 2019-09-29 DIAGNOSIS — Z872 Personal history of diseases of the skin and subcutaneous tissue: Secondary | ICD-10-CM | POA: Diagnosis not present

## 2019-09-29 DIAGNOSIS — Z86018 Personal history of other benign neoplasm: Secondary | ICD-10-CM | POA: Diagnosis not present

## 2019-09-29 DIAGNOSIS — L578 Other skin changes due to chronic exposure to nonionizing radiation: Secondary | ICD-10-CM | POA: Diagnosis not present

## 2019-10-01 DIAGNOSIS — H43813 Vitreous degeneration, bilateral: Secondary | ICD-10-CM | POA: Diagnosis not present

## 2019-10-01 DIAGNOSIS — H35373 Puckering of macula, bilateral: Secondary | ICD-10-CM | POA: Diagnosis not present

## 2019-10-01 DIAGNOSIS — H34832 Tributary (branch) retinal vein occlusion, left eye, with macular edema: Secondary | ICD-10-CM | POA: Diagnosis not present

## 2019-10-01 DIAGNOSIS — H43391 Other vitreous opacities, right eye: Secondary | ICD-10-CM | POA: Diagnosis not present

## 2019-11-20 ENCOUNTER — Other Ambulatory Visit: Payer: Self-pay | Admitting: Family Medicine

## 2019-11-20 MED ORDER — VALSARTAN 80 MG PO TABS: 80.0000 mg | ORAL_TABLET | Freq: Every day | ORAL | 0 refills | Status: DC

## 2019-11-20 NOTE — Telephone Encounter (Signed)
Medication Refill - Medication:valsartan (DIOVAN) 80 MG tablet (Patient is completely out of medication of would like a supply of medication sent to pharmacy to hold her until her upcoming appointment   Has the patient contacted their pharmacy? yes (Agent: If no, request that the patient contact the pharmacy for the refill.) (Agent: If yes, when and what did the pharmacy advise?)Contact PCP  Preferred Pharmacy (with phone number or street name):  [id number] - [place], [place] [address] Phone:  [phone]  Fax:  [phone]       Agent: Please be advised that RX refills may take up to 3 business days. We ask that you follow-up with your pharmacy.

## 2019-11-21 DIAGNOSIS — H43813 Vitreous degeneration, bilateral: Secondary | ICD-10-CM | POA: Diagnosis not present

## 2019-11-21 DIAGNOSIS — H43391 Other vitreous opacities, right eye: Secondary | ICD-10-CM | POA: Diagnosis not present

## 2019-11-21 DIAGNOSIS — H35373 Puckering of macula, bilateral: Secondary | ICD-10-CM | POA: Diagnosis not present

## 2019-11-21 DIAGNOSIS — H34832 Tributary (branch) retinal vein occlusion, left eye, with macular edema: Secondary | ICD-10-CM | POA: Diagnosis not present

## 2019-12-08 ENCOUNTER — Encounter: Payer: Medicare PPO | Admitting: Family Medicine

## 2019-12-11 ENCOUNTER — Telehealth: Payer: Self-pay

## 2019-12-11 MED ORDER — TRIMO-SAN 0.025 % VA GEL: VAGINAL | 2 refills | Status: DC

## 2019-12-11 NOTE — Telephone Encounter (Signed)
RX sent to pharmacy. I have notified patient.

## 2019-12-11 NOTE — Telephone Encounter (Signed)
Patient called in stating that she went to pick up her Trimo-san over at the pharmacy however she is stating that she has never had to have a prescription for it before but now they are requiring a prescription be sent in for this medication.  Could you please advise?  Patient is using the [facility] on [address] # [place], [place] [address].

## 2019-12-14 ENCOUNTER — Telehealth: Payer: Self-pay | Admitting: Family Medicine

## 2019-12-15 NOTE — Telephone Encounter (Signed)
Patient has not had follow up appointment. Need appointment schedule. I will check with Kristeen Miss to give her a few until she come in

## 2019-12-16 NOTE — Telephone Encounter (Signed)
Pt already have an appt scheduled for 1.4.2022 Kristeen Miss first availability).

## 2020-01-06 ENCOUNTER — Encounter: Payer: Medicare PPO | Admitting: Family Medicine

## 2020-01-09 ENCOUNTER — Other Ambulatory Visit: Payer: Self-pay | Admitting: Family Medicine

## 2020-01-09 DIAGNOSIS — H34832 Tributary (branch) retinal vein occlusion, left eye, with macular edema: Secondary | ICD-10-CM | POA: Diagnosis not present

## 2020-01-09 DIAGNOSIS — H35373 Puckering of macula, bilateral: Secondary | ICD-10-CM | POA: Diagnosis not present

## 2020-01-09 DIAGNOSIS — H43813 Vitreous degeneration, bilateral: Secondary | ICD-10-CM | POA: Diagnosis not present

## 2020-01-09 DIAGNOSIS — H3562 Retinal hemorrhage, left eye: Secondary | ICD-10-CM | POA: Diagnosis not present

## 2020-01-21 ENCOUNTER — Encounter: Payer: Medicare PPO | Admitting: Family Medicine

## 2020-02-06 ENCOUNTER — Other Ambulatory Visit: Payer: Self-pay | Admitting: Family Medicine

## 2020-02-10 ENCOUNTER — Ambulatory Visit: Payer: Medicare PPO

## 2020-02-17 ENCOUNTER — Encounter: Payer: Medicare PPO | Admitting: Family Medicine

## 2020-02-19 ENCOUNTER — Encounter: Payer: Self-pay | Admitting: Obstetrics and Gynecology

## 2020-02-19 ENCOUNTER — Other Ambulatory Visit: Payer: Self-pay

## 2020-02-19 ENCOUNTER — Ambulatory Visit (INDEPENDENT_AMBULATORY_CARE_PROVIDER_SITE_OTHER): Payer: Medicare PPO | Admitting: Obstetrics and Gynecology

## 2020-02-19 VITALS — BP 184/98 | HR 75 | Ht 66.0 in | Wt 149.0 lb

## 2020-02-19 DIAGNOSIS — T8389XA Other specified complication of genitourinary prosthetic devices, implants and grafts, initial encounter: Secondary | ICD-10-CM

## 2020-02-19 DIAGNOSIS — M810 Age-related osteoporosis without current pathological fracture: Secondary | ICD-10-CM | POA: Diagnosis not present

## 2020-02-19 DIAGNOSIS — Z4689 Encounter for fitting and adjustment of other specified devices: Secondary | ICD-10-CM | POA: Diagnosis not present

## 2020-02-19 DIAGNOSIS — N898 Other specified noninflammatory disorders of vagina: Secondary | ICD-10-CM

## 2020-02-19 NOTE — Progress Notes (Signed)
HPI:      [name] is a 85 y.o. No obstetric history on file. who LMP was No LMP recorded. Patient is postmenopausal.  Subjective:   She presents today 1 month early from her pessary check because she began bleeding a few weeks ago.  She says over the last few days the bleeding has gotten heavier.  This is not the first time that she has had some type of irritation/erosion from her pessary. In addition patient had been prescribed Actonel for osteoporosis.  She decided not to take it because she was concerned regarding the possibility of gastric erosions/irritation.  She would like to discuss this today.    Hx: The following portions of the patient's history were reviewed and updated as appropriate:             She  has a past medical history of Arthritis, Fibroid, uterine, GERD (gastroesophageal reflux disease), History of abnormal cervical Pap smear, History of frequent urinary tract infections, History of urinary tract infection, Hypercholesteremia, Hypertension, Menopause, Nocturia, OP (osteoporosis), Pessary maintenance, PONV (postoperative nausea and vomiting), Retinal vessel occlusion, Status post removal of part of parathyroid ([place]) (05/02/2017), Urethral caruncle, Urinary incontinence, Urinary urgency, and Vaginal atrophy. She does not have any pertinent problems on file. She  has a past surgical history that includes Hernia repair; Tonsillectomy; Thyroidectomy; Total hip arthroplasty; Cataract extraction; Thyroglossal duct cyst; Tonsillectomy; Total hip arthroplasty (Right, 08/26/2014); Eye surgery; Eye surgery; and Total knee arthroplasty (Left, 10/25/2015). Her family history includes Alzheimer's disease in her mother; Breast cancer in her paternal aunt; Cancer in her father; Diabetes in her son; Hypertension in her father; Stroke in her paternal grandfather; Tuberculosis in her paternal grandmother. She  reports that she has never smoked. She has never used smokeless tobacco. She  reports that she does not drink alcohol and does not use drugs. She has a current medication list which includes the following prescription(s): ascorbic acid, aspirin ec, biotin, calcium-magnesium-zinc, cholecalciferol, ibuprofen, lutein, trimo-san, synthroid, tizanidine, valsartan, vitamin a, and vitamin b-12. She has No Known Allergies.       Review of Systems:  Review of Systems  Constitutional: Denied constitutional symptoms, night sweats, recent illness, fatigue, fever, insomnia and weight loss.  Eyes: Denied eye symptoms, eye pain, photophobia, vision change and visual disturbance.  Ears/Nose/Throat/Neck: Denied ear, nose, throat or neck symptoms, hearing loss, nasal discharge, sinus congestion and sore throat.  Cardiovascular: Denied cardiovascular symptoms, arrhythmia, chest pain/pressure, edema, exercise intolerance, orthopnea and palpitations.  Respiratory: Denied pulmonary symptoms, asthma, pleuritic pain, productive sputum, cough, dyspnea and wheezing.  Gastrointestinal: Denied, gastro-esophageal reflux, melena, nausea and vomiting.  Genitourinary: See HPI for additional information.  Musculoskeletal: See HPI for additional information.  Dermatologic: Denied dermatology symptoms, rash and scar.  Neurologic: Denied neurology symptoms, dizziness, headache, neck pain and syncope.  Psychiatric: Denied psychiatric symptoms, anxiety and depression.  Endocrine: Denied endocrine symptoms including hot flashes and night sweats.   Meds:   Current Outpatient Medications on File Prior to Visit  Medication Sig Dispense Refill  . ascorbic acid (VITAMIN C) 1000 MG tablet Take 1 tablet by mouth daily.    Marland Kitchen aspirin EC 81 MG tablet Take by mouth.    . Biotin 10 MG CAPS Take by mouth.    Marland Kitchen CALCIUM-MAGNESIUM-ZINC PO Take by mouth.    . cholecalciferol (VITAMIN D) 1000 units tablet Take 1,000 Units by mouth daily.    Marland Kitchen ibuprofen (ADVIL,MOTRIN) 200 MG tablet Take by mouth.    Marland Kitchen  Lutein 20 MG CAPS  Take by mouth.    Levin Erp SULFATE VAGINAL (TRIMO-SAN) 0.025 % GEL Place in vagina daily. 113.4 g 2  . SYNTHROID 50 MCG tablet Take 1 tablet once daily 90 tablet 3  . tizanidine (ZANAFLEX) 2 MG capsule Take 1 capsule (2 mg total) by mouth 3 (three) times daily as needed for muscle spasms. May make drowsy, not to drive while taking. 30 capsule 1  . valsartan (DIOVAN) 80 MG tablet TAKE 1 TABLET (80 MG TOTAL) BY MOUTH DAILY. FOR BLOOD PRESSURE 30 tablet 0  . vitamin A 8000 UNIT capsule Take by mouth.    . vitamin B-12 (CYANOCOBALAMIN) 1000 MCG tablet Take 1,000 mcg by mouth daily.     No current facility-administered medications on file prior to visit.          Objective:     Vitals:   02/19/20 1204  BP: (!) 184/98  Pulse: 75   Filed Weights   02/19/20 1204  Weight: 149 lb (67.6 kg)              Physical examination   Pelvic:   Vulva: Normal appearance.  No lesions.  Vagina: No lesions or abnormalities noted.  Small superficial appearing erosion noted along the right side of the vagina.  No bleeding noted from the cervical os.  Support:  Pelvic relaxation  Urethra No masses tenderness or scarring.  Meatus Normal size without lesions or prolapse.  Cervix: Normal appearance.  No lesions.  Anus: Normal exam.  No lesions.  Perineum: Normal exam.  No lesions.        Bimanual   Uterus: Normal size.  Non-tender.  Mobile.  AV.  Adnexae: No masses.  Non-tender to palpation.  Cul-de-sac: Negative for abnormality.   Pessary Care Pessary removed and cleaned.  Vagina checked by speculum exam-see above.  Assessment:    No obstetric history on file. Patient Active Problem List   Diagnosis Date Noted  . Postoperative hypothyroidism 12/06/2018  . S/P parathyroidectomy ([place]) 12/06/2018  . Vaginal odor 12/12/2017  . Mixed hyperlipidemia 12/04/2017  . Incomplete uterovaginal prolapse 05/03/2016  . Vitamin D deficiency 03/31/2016  . OA (osteoarthritis) of knee 10/25/2015  .  Nocturia 06/02/2015  . Cystocele, midline 06/02/2015  . Breast cancer screening 03/10/2015  . OA (osteoarthritis) of hip 08/26/2014  . Hypercalcemia 07/06/2014  . Vaginal erosion secondary to pessary use ([place]) 07/01/2014  . Hypothyroid 04/10/2013  . Essential hypertension 04/10/2013  . Arthritis 04/10/2013  . Osteoporosis 04/10/2013     1. Pessary maintenance   2. Age-related osteoporosis without current pathological fracture   3. Vaginal erosion secondary to pessary use, initial encounter ([place])        Plan:            1.  Removed pessary.  Will not replace pessary at this time.  Will allow for vagina to heal.  Patient to use estrogen cream twice weekly to speed healing process.  2.  Discussed osteoporosis and use of Actonel and gastric erosions.  Patient has agreed to try it for the next few weeks.  We will discuss it at her next visit should she have issues.  She would also like to discuss it with her new internist.  She has an appointment there in March.  Orders No orders of the defined types were placed in this encounter.   No orders of the defined types were placed in this encounter.     F/U  Return in  about 6 weeks (around 04/01/2020). I spent 23 minutes involved in the care of this patient preparing to see the patient by obtaining and reviewing her medical history (including labs, imaging tests and prior procedures), documenting clinical information in the electronic health record (EHR), counseling and coordinating care plans, writing and sending prescriptions, ordering tests or procedures and directly communicating with the patient by discussing pertinent items from her history and physical exam as well as detailing my assessment and plan as noted above so that she has an informed understanding.  All of her questions were answered.  Finis Bud, M.D. 02/19/2020 12:39 PM

## 2020-02-24 ENCOUNTER — Telehealth: Payer: Self-pay | Admitting: Family Medicine

## 2020-02-24 NOTE — Telephone Encounter (Signed)
Copied from [place] [id number]. Topic: Medicare AWV >> Feb 24, 2020 12:04 PM Cher Nakai R wrote: Reason for CRM: Left message for patient to call back and schedule Medicare Annual Wellness Visit (AWV) in office.   If unable to come into the office for AWV,  please offer to do virtually or by telephone.  Last AWV: 02/04/2019  Please schedule at anytime with [place].  40 minute appointment  Any questions, please contact me at [phone]

## 2020-02-24 NOTE — Telephone Encounter (Signed)
Stated she told person she cancelled appointment with that she did not want to have AWV and was not rescheduling.

## 2020-03-09 ENCOUNTER — Telehealth: Payer: Self-pay

## 2020-03-09 NOTE — Telephone Encounter (Signed)
Left message for patient that office will call her back in a few days to schedule appointment. If cannot get a physical will get a doctor appointment to have her labs check  thyroid and refill any medications

## 2020-03-09 NOTE — Telephone Encounter (Signed)
Copied from [place] [id number]. Topic: General - Other >> Mar 09, 2020 12:10 PM Leward Quan A wrote: Reason for CRM: Patient called in a bit upset that the appointment for her Physical have been postponed for the fifth time she would like to know if Dr Ancil Boozer can place the order for her to come get her blood work done since she is on thyroid medication and its been quite a while since it was last checked. Please call Ph# [phone]

## 2020-03-12 ENCOUNTER — Encounter: Payer: Medicare PPO | Admitting: Family Medicine

## 2020-03-17 ENCOUNTER — Other Ambulatory Visit: Payer: Self-pay | Admitting: Emergency Medicine

## 2020-03-17 ENCOUNTER — Other Ambulatory Visit: Payer: Self-pay | Admitting: Family Medicine

## 2020-03-17 ENCOUNTER — Encounter: Payer: Medicare PPO | Admitting: Family Medicine

## 2020-03-17 DIAGNOSIS — E890 Postprocedural hypothyroidism: Secondary | ICD-10-CM

## 2020-03-17 MED ORDER — VALSARTAN 80 MG PO TABS: 80.0000 mg | ORAL_TABLET | Freq: Every day | ORAL | 0 refills | Status: DC

## 2020-03-17 MED ORDER — SYNTHROID 50 MCG PO TABS: ORAL_TABLET | ORAL | 0 refills | Status: DC

## 2020-03-17 NOTE — Telephone Encounter (Addendum)
Pt has taken her last synthroid 50 mcg medication and needs a refill also l on valsartan 80 mg sent to [facility] in [place] [address] phone number [phone]. Pt has a cpe sch for 03-22-2020

## 2020-03-17 NOTE — Telephone Encounter (Signed)
Scripts pend to [employer]

## 2020-03-22 ENCOUNTER — Ambulatory Visit (INDEPENDENT_AMBULATORY_CARE_PROVIDER_SITE_OTHER): Payer: Medicare PPO | Admitting: Family Medicine

## 2020-03-22 ENCOUNTER — Other Ambulatory Visit: Payer: Self-pay

## 2020-03-22 ENCOUNTER — Encounter: Payer: Self-pay | Admitting: Family Medicine

## 2020-03-22 VITALS — BP 160/62 | HR 59 | Temp 97.0°F | Resp 16 | Ht 66.0 in | Wt 149.0 lb

## 2020-03-22 DIAGNOSIS — E890 Postprocedural hypothyroidism: Secondary | ICD-10-CM | POA: Diagnosis not present

## 2020-03-22 DIAGNOSIS — I10 Essential (primary) hypertension: Secondary | ICD-10-CM | POA: Diagnosis not present

## 2020-03-22 DIAGNOSIS — M810 Age-related osteoporosis without current pathological fracture: Secondary | ICD-10-CM

## 2020-03-22 DIAGNOSIS — E559 Vitamin D deficiency, unspecified: Secondary | ICD-10-CM | POA: Diagnosis not present

## 2020-03-22 DIAGNOSIS — Z7189 Other specified counseling: Secondary | ICD-10-CM

## 2020-03-22 NOTE — Assessment & Plan Note (Signed)
At goal for age with wide pulse pressure. No changes made today. Obtaining labs. F/u in 6 mths.

## 2020-03-22 NOTE — Patient Instructions (Addendum)
It was great to see you!  Our plans for today:  - No changes to your medications today.  - Try taking tums as needed instead of prilosec for your acid reflux. - We are checking some labs today, we will call you with these results.  - Continue to take the new medication for your osteoporosis and follow up with your GYN. - Come back in 6 months for blood pressure follow up.  Take care and seek immediate care sooner if you develop any concerns.   Dr. Ky Barban  Things to do to keep yourself healthy  - Exercise at least 30-45 minutes a day, 3-4 days a week.  - Eat a low-fat diet with lots of fruits and vegetables, up to 7-9 servings per day.  - Seatbelts can save your life. Wear them always.  - Smoke detectors on every level of your home, check batteries every year.  - Eye Doctor - have an eye exam every 1-2 years  - Safe sex - if you may be exposed to STDs, use a condom.  - Alcohol -  If you drink, do it moderately, less than 2 drinks per day.  - [place]. Choose someone to speak for you if you are not able. https://www.prepareforyourcare.org is a great website to help you navigate this. - Depression is common in our stressful world.If you're feeling down or losing interest in things you normally enjoy, please come in for a visit.  - Violence - If anyone is threatening or hurting you, please call immediately.

## 2020-03-22 NOTE — Assessment & Plan Note (Addendum)
Recently started on actonel, recommend continuation. Will check ca, vit d levels today

## 2020-03-22 NOTE — Progress Notes (Signed)
BP (!) 160/62   Pulse (!) 59   Temp (!) 97 F (36.1 C) (Oral)   Resp 16   Ht 5\' 6"  (1.676 m)   Wt 149 lb (67.6 kg)   SpO2 99%   BMI 24.05 kg/m    Subjective:    Patient ID: Haley Schneider, female    DOB: 08/11/1935, 85 y.o.   MRN: [id number]  HPI: Haley Schneider is a 85 y.o. female presenting on 03/22/2020 for comprehensive medical examination. Current medical complaints include:none  Hypertension: - Medications: valsartan 80mg  daily - Compliance: good - Checking BP at home: no - Denies any SOB, CP, vision changes, LE edema, medication SEs, or symptoms of hypotension  Hypothyroidism - Medications: Synthroid 80mcg - Current symptoms:  none - Denies change in energy level, diarrhea, nervousness and palpitations - Symptoms have been well controlled.   Osteoporosis - on calcium and vit D. Previously declined prolia injections. Previously tried fosamax, didn't tolerate, caused arthritis to hurt worse. Did have a fall last year. No fractures. Recently prescribed Actonel by OB/GYN.  She currently lives with: alone Menopausal Symptoms: no  Depression Screen done today and results listed below:  Depression screen [facility] 2/9 03/22/2020 05/16/2019 02/04/2019 12/06/2018 06/06/2018  Decreased Interest 0 0 0 0 0  Down, Depressed, Hopeless 0 0 0 0 0  PHQ - 2 Score 0 0 0 0 0  Altered sleeping - 0 - 0 0  Tired, decreased energy - 0 - 0 0  Change in appetite - 0 - 0 0  Feeling bad or failure about yourself  - 0 - 0 0  Trouble concentrating - 0 - 0 0  Moving slowly or fidgety/restless - 0 - 0 0  Suicidal thoughts - 0 - 0 0  PHQ-9 Score - 0 - 0 0  Difficult doing work/chores - Not difficult at all - Not difficult at all Not difficult at all    The patient has a history of falls. I did complete a risk assessment for falls.    Past Medical History:  Past Medical History:  Diagnosis Date  . Arthritis   . Fibroid, uterine   . GERD (gastroesophageal reflux disease)   . History of  abnormal cervical Pap smear   . History of frequent urinary tract infections   . History of urinary tract infection   . Hypercholesteremia   . Hypertension   . Menopause   . Nocturia   . OP (osteoporosis)   . Pessary maintenance    # 5 ring with support  . PONV (postoperative nausea and vomiting)    many yrs ago  . Retinal vessel occlusion   . Status post removal of part of parathyroid ([place]) 05/02/2017  . Urethral caruncle   . Urinary incontinence   . Urinary urgency   . Vaginal atrophy     Surgical History:  Past Surgical History:  Procedure Laterality Date  . CATARACT EXTRACTION    . EYE SURGERY     cataract surgery bilat   . EYE SURGERY     laser eye surgery left eye"retinal acclution"laser  . HERNIA REPAIR    . THYROGLOSSAL DUCT CYST    . THYROIDECTOMY    . TONSILLECTOMY    . TONSILLECTOMY    . TOTAL HIP ARTHROPLASTY     left hip 7 years ago   . TOTAL HIP ARTHROPLASTY Right 08/26/2014   Procedure: RIGHT TOTAL HIP ARTHROPLASTY ANTERIOR APPROACH;  Surgeon: Gaynelle Arabian, MD;  Location: [facility];  Service: Orthopedics;  Laterality: Right;  . TOTAL KNEE ARTHROPLASTY Left 10/25/2015   Procedure: LEFT TOTAL KNEE ARTHROPLASTY;  Surgeon: Gaynelle Arabian, MD;  Location: [facility];  Service: Orthopedics;  Laterality: Left;    Medications:  Current Outpatient Medications on File Prior to Visit  Medication Sig  . ascorbic acid (VITAMIN C) 1000 MG tablet Take 1 tablet by mouth daily.  Marland Kitchen aspirin EC 81 MG tablet Take by mouth.  . Biotin 10 MG CAPS Take by mouth.  Marland Kitchen CALCIUM-MAGNESIUM-ZINC PO Take by mouth.  . cholecalciferol (VITAMIN D) 1000 units tablet Take 1,000 Units by mouth daily.  Marland Kitchen ibuprofen (ADVIL,MOTRIN) 200 MG tablet Take by mouth.  . Lutein 20 MG CAPS Take by mouth.  Levin Erp SULFATE VAGINAL (TRIMO-SAN) 0.025 % GEL Place in vagina daily.  . risedronate (ACTONEL) 5 MG tablet Take 5 mg by mouth daily before breakfast. with water on empty stomach, nothing by mouth or  lie down for next 30 minutes.  Marland Kitchen SYNTHROID 50 MCG tablet Take 1 tablet once daily  . tizanidine (ZANAFLEX) 2 MG capsule Take 1 capsule (2 mg total) by mouth 3 (three) times daily as needed for muscle spasms. May make drowsy, not to drive while taking.  . valsartan (DIOVAN) 80 MG tablet Take 1 tablet (80 mg total) by mouth daily. For blood pressure  . vitamin A 8000 UNIT capsule Take by mouth.  . vitamin B-12 (CYANOCOBALAMIN) 1000 MCG tablet Take 1,000 mcg by mouth daily.   No current facility-administered medications on file prior to visit.    Allergies:  No Known Allergies  Social History:  Social History   Socioeconomic History  . Marital status: Widowed    Spouse name: Not on file  . Number of children: 2  . Years of education: Not on file  . Highest education level: Not on file  Occupational History  . Occupation: retired  Tobacco Use  . Smoking status: Never Smoker  . Smokeless tobacco: Never Used  Vaping Use  . Vaping Use: Never used  Substance and Sexual Activity  . Alcohol use: No  . Drug use: No  . Sexual activity: Not on file  Other Topics Concern  . Not on file  Social History Narrative  . Not on file   Social Determinants of Health   Financial Resource Strain: Low Risk   . Difficulty of Paying Living Expenses: Not hard at all  Food Insecurity: No Food Insecurity  . Worried About Charity fundraiser in the Last Year: Never true  . Ran Out of Food in the Last Year: Never true  Transportation Needs: No Transportation Needs  . Lack of Transportation (Medical): No  . Lack of Transportation (Non-Medical): No  Physical Activity: Inactive  . Days of Exercise per Week: 0 days  . Minutes of Exercise per Session: 0 min  Stress: No Stress Concern Present  . Feeling of Stress : Only a little  Social Connections: Moderately Isolated  . Frequency of Communication with Friends and Family: More than three times a week  . Frequency of Social Gatherings with Friends  and Family: Twice a week  . Attends Religious Services: More than 4 times per year  . Active Member of Clubs or Organizations: No  . Attends Archivist Meetings: Not on file  . Marital Status: Widowed  Intimate Partner Violence: Not on file   Social History   Tobacco Use  Smoking Status Never Smoker  Smokeless Tobacco Never Used   Social  History   Substance and Sexual Activity  Alcohol Use No    Family History:  Family History  Problem Relation Age of Onset  . Alzheimer's disease Mother   . Cancer Father        lung  . Hypertension Father   . Stroke Paternal Grandfather   . Tuberculosis Paternal Grandmother   . Diabetes Son   . Breast cancer Paternal Aunt   . Heart disease Neg Hx     Past medical history, surgical history, medications, allergies, family history and social history reviewed with patient today and changes made to appropriate areas of the chart.   ROS All other ROS negative except what is listed above and in the HPI.      Objective:    BP (!) 160/62   Pulse (!) 59   Temp (!) 97 F (36.1 C) (Oral)   Resp 16   Ht 5\' 6"  (1.676 m)   Wt 149 lb (67.6 kg)   SpO2 99%   BMI 24.05 kg/m   Wt Readings from Last 3 Encounters:  03/22/20 149 lb (67.6 kg)  02/19/20 149 lb (67.6 kg)  09/25/19 147 lb (66.7 kg)    Physical Exam Constitutional:      General: She is not in acute distress.    Appearance: Normal appearance. She is not toxic-appearing.  HENT:     Head: Normocephalic.     Right Ear: External ear normal.     Left Ear: External ear normal.  Eyes:     Extraocular Movements: Extraocular movements intact.  Neck:     Comments: No thyromegaly Cardiovascular:     Rate and Rhythm: Normal rate and regular rhythm.     Heart sounds: Normal heart sounds. No murmur heard.   Pulmonary:     Effort: No respiratory distress.     Breath sounds: Normal breath sounds.  Abdominal:     General: Bowel sounds are normal.     Palpations: Abdomen is  soft.     Tenderness: There is no abdominal tenderness. There is no guarding.  Musculoskeletal:        General: Normal range of motion.     Cervical back: Normal range of motion.     Right lower leg: No edema.     Left lower leg: No edema.  Skin:    General: Skin is warm and dry.  Neurological:     Mental Status: She is alert and oriented to person, place, and time. Mental status is at baseline.  Psychiatric:        Mood and Affect: Mood normal.        Behavior: Behavior normal.     Results for orders placed or performed in visit on 03/25/19  Urine Culture   Specimen: Urine   UR  Result Value Ref Range   Urine Culture, Routine Final report (A)    Organism ID, Bacteria Klebsiella pneumoniae (A)    Antimicrobial Susceptibility Comment   POC Urinalysis Dipstick OB  Result Value Ref Range   Color, UA yellow    Clarity, UA cloudy    Glucose, UA Negative Negative   Bilirubin, UA neg    Ketones, UA neg    Spec Grav, UA 1.010 1.010 - 1.025   Blood, UA large    pH, UA 7.0 5.0 - 8.0   POC,PROTEIN,UA Negative Negative, Trace, Small (1+), Moderate (2+), Large (3+), 4+   Urobilinogen, UA 0.2 0.2 or 1.0 E.U./dL   Nitrite, UA neg  Leukocytes, UA Large (3+) (A) Negative   Appearance     Odor        Assessment & Plan:   Problem List Items Addressed This Visit      Cardiovascular and Mediastinum   Essential hypertension   Relevant Orders   Basic Metabolic Panel (BMET)     Endocrine   Postoperative hypothyroidism - Primary   Relevant Orders   TSH     Musculoskeletal and Integument   Osteoporosis   Relevant Medications   risedronate (ACTONEL) 5 MG tablet   Other Relevant Orders   VITAMIN D 25 Hydroxy (Vit-D Deficiency, Fractures)     Other   Vitamin D deficiency   Relevant Orders   VITAMIN D 25 Hydroxy (Vit-D Deficiency, Fractures)    Other Visit Diagnoses    Encounter for herb and vitamin supplement management       Relevant Orders   B12   Vitamin A        Follow up plan: Return in about 6 months (around 09/22/2020) for HTN.   LABORATORY TESTING:  - Pap smear: not applicable.   IMMUNIZATIONS:   - Tdap: Tetanus vaccination status reviewed: tetanus status unknown to the patient. - Influenza: Refused - Pneumovax: Up to date - Prevnar: Up to date - HPV: Not applicable - Shingrix vaccine: never had chicken pox. Did have Zostavax vaccine.  SCREENING: - Mammogram: normal 02/2019  - Colonoscopy: Not applicable.   - Bone Density: Up to date, 02/2019, osteoporosis   PATIENT COUNSELING:   Advised to take 1 mg of folate supplement per day if capable of pregnancy.   Sexuality: Discussed sexually transmitted diseases, partner selection, use of condoms, avoidance of unintended pregnancy  and contraceptive alternatives.   Advised to avoid cigarette smoking.  I discussed with the patient that most people either abstain from alcohol or drink within safe limits (<=14/week and <=4 drinks/occasion for males, <=7/weeks and <= 3 drinks/occasion for females) and that the risk for alcohol disorders and other health effects rises proportionally with the number of drinks per week and how often a drinker exceeds daily limits.  Discussed cessation/primary prevention of drug use and availability of treatment for abuse.   Diet: Encouraged to adjust caloric intake to maintain  or achieve ideal body weight, to reduce intake of dietary saturated fat and total fat, to limit sodium intake by avoiding high sodium foods and not adding table salt, and to maintain adequate dietary potassium and calcium preferably from fresh fruits, vegetables, and low-fat dairy products.    stressed the importance of regular exercise  Injury prevention: Discussed safety belts, safety helmets, smoke detector, smoking near bedding or upholstery.   Dental health: Discussed importance of regular tooth brushing, flossing, and dental visits.    NEXT PREVENTATIVE PHYSICAL DUE IN 1  YEAR. Return in about 6 months (around 09/22/2020) for HTN.

## 2020-03-22 NOTE — Assessment & Plan Note (Signed)
Recheck labs today. 

## 2020-03-23 ENCOUNTER — Other Ambulatory Visit: Payer: Self-pay | Admitting: Family Medicine

## 2020-03-23 ENCOUNTER — Telehealth: Payer: Self-pay | Admitting: Obstetrics and Gynecology

## 2020-03-23 ENCOUNTER — Telehealth: Payer: Self-pay

## 2020-03-23 ENCOUNTER — Other Ambulatory Visit: Payer: Self-pay

## 2020-03-23 MED ORDER — RISEDRONATE SODIUM 35 MG PO TABS: 35.0000 mg | ORAL_TABLET | ORAL | 3 refills | Status: DC

## 2020-03-23 NOTE — Telephone Encounter (Signed)
Copied from [place] [id number]. Topic: General - Inquiry >> Mar 23, 2020 10:18 AM Loma [place] wrote: Reason for CRM: Pt saw [facility] yesterday for CPE and has stated she was speaking to her re: given something for her bones by Uvaldo Rising. She was only given a sample and wants to try that or whatever would be recommended. She does not want a lot because if it makes her have side effects she will not continue to take.  She does not have a clue what it was and threw the bottle out and just a sample. Wants a nurse to FU with her re this med request to what is available and can be given along with her other meds/ Fu at [phone]

## 2020-03-23 NOTE — Telephone Encounter (Signed)
I think you sent this yesterday to her pharmacy. I told her to call and check.

## 2020-03-23 NOTE — Telephone Encounter (Signed)
Spoke with patient to let her know that Dr. Amalia Hailey did not prescribe this medication. Patient is going to call her PCP due to her ordering and following bone density for prescription.

## 2020-03-23 NOTE — Telephone Encounter (Signed)
Looks like this was initially started by OB/GYN in 09/2019. Happy to continue it given she hasn't had any issues thus far. Refill sent to [facility] for 1 yr supply.

## 2020-03-23 NOTE — Telephone Encounter (Signed)
Tyrihanna called in and stated she received a sample of Actonel from Dr. Amalia Hailey at her last visit.  Ravina stated she has taken it with no issues and would like a prescription called in to [facility] in [place] for Actonel.  Please advise.

## 2020-03-24 ENCOUNTER — Encounter: Payer: Medicare PPO | Admitting: Obstetrics and Gynecology

## 2020-03-25 LAB — VITAMIN D 25 HYDROXY (VIT D DEFICIENCY, FRACTURES): Vit D, 25-Hydroxy: 48 ng/mL (ref 30–100)

## 2020-03-25 LAB — BASIC METABOLIC PANEL
BUN: 13 mg/dL (ref 7–25)
CO2: 27 mmol/L (ref 20–32)
Calcium: 9.4 mg/dL (ref 8.6–10.4)
Chloride: 97 mmol/L — ABNORMAL LOW (ref 98–110)
Creat: 0.62 mg/dL (ref 0.60–0.88)
Glucose, Bld: 86 mg/dL (ref 65–99)
Potassium: 4.6 mmol/L (ref 3.5–5.3)
Sodium: 134 mmol/L — ABNORMAL LOW (ref 135–146)

## 2020-03-25 LAB — TSH: TSH: 1.12 mIU/L (ref 0.40–4.50)

## 2020-03-25 LAB — VITAMIN A: Vitamin A (Retinoic Acid): 55 ug/dL (ref 38–98)

## 2020-03-25 LAB — VITAMIN B12: Vitamin B-12: >2000 pg/mL — ABNORMAL HIGH (ref 200–1100)

## 2020-04-01 ENCOUNTER — Other Ambulatory Visit: Payer: Self-pay

## 2020-04-01 ENCOUNTER — Ambulatory Visit (INDEPENDENT_AMBULATORY_CARE_PROVIDER_SITE_OTHER): Payer: Medicare PPO | Admitting: Obstetrics and Gynecology

## 2020-04-01 ENCOUNTER — Encounter: Payer: Self-pay | Admitting: Obstetrics and Gynecology

## 2020-04-01 VITALS — BP 156/87 | Wt 151.6 lb

## 2020-04-01 DIAGNOSIS — N898 Other specified noninflammatory disorders of vagina: Secondary | ICD-10-CM

## 2020-04-01 DIAGNOSIS — T8389XD Other specified complication of genitourinary prosthetic devices, implants and grafts, subsequent encounter: Secondary | ICD-10-CM | POA: Diagnosis not present

## 2020-04-01 DIAGNOSIS — Z4689 Encounter for fitting and adjustment of other specified devices: Secondary | ICD-10-CM

## 2020-04-01 NOTE — Progress Notes (Signed)
HPI:      [name] is a 85 y.o. No obstetric history on file. who LMP was No LMP recorded. Patient is postmenopausal.  Subjective:   She presents today for follow-up of vaginal erosion secondary to pessary use.  At her last visit her pessary was removed and she continues to use estrogen twice a week to allow healing of vaginal erosion.  She denies any further vaginal bleeding since pessary was removed.  She would like the pessary replaced today if no further erosions are present. She has begun taking Actonel for osteoporosis-she was concerned regarding stomach upset-she has taken 1 dose and has not had a problem yet.  She will continue to use it weekly.    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 She does not have any pertinent problems on file. She  has a past surgical history that includes Hernia repair; Tonsillectomy; Thyroidectomy; Total hip arthroplasty; Cataract extraction; Thyroglossal duct cyst; Tonsillectomy; Total hip arthroplasty (Right, 08/26/2014); Eye surgery; Eye surgery; and Total knee arthroplasty (Left, 10/25/2015). Her family history includes Alzheimer's disease in her mother; Breast cancer in her paternal aunt; Cancer in her father; Diabetes in her son; Hypertension in her father; Stroke in her paternal grandfather; Tuberculosis in her paternal grandmother. She  reports that she has  never smoked. She has never used smokeless tobacco. She reports that she does not drink alcohol and does not use drugs. She has a current medication list which includes the following prescription(s): ascorbic acid, aspirin ec, biotin, calcium-magnesium-zinc, cholecalciferol, ibuprofen, lutein, trimo-san, risedronate, synthroid, tizanidine, valsartan, vitamin a, and vitamin b-12. She has No Known Allergies.       Review of Systems:  Review of Systems  Constitutional: Denied constitutional symptoms, night sweats, recent illness, fatigue, fever, insomnia and weight loss.  Eyes: Denied eye symptoms, eye pain, photophobia, vision change and visual disturbance.  Ears/Nose/Throat/Neck: Denied ear, nose, throat or neck symptoms, hearing loss, nasal discharge, sinus congestion and sore throat.  Cardiovascular: Denied cardiovascular symptoms, arrhythmia, chest pain/pressure, edema, exercise intolerance, orthopnea and palpitations.  Respiratory: Denied pulmonary symptoms, asthma, pleuritic pain, productive sputum, cough, dyspnea and wheezing.  Gastrointestinal: Denied, gastro-esophageal reflux, melena, nausea and vomiting.  Genitourinary: Denied genitourinary symptoms including symptomatic vaginal discharge, pelvic relaxation issues, and urinary complaints.  Musculoskeletal: Denied musculoskeletal symptoms, stiffness, swelling, muscle weakness and myalgia.  Dermatologic: Denied dermatology symptoms, rash and scar.  Neurologic: Denied neurology symptoms, dizziness, headache, neck pain and syncope.  Psychiatric: Denied psychiatric symptoms, anxiety and depression.  Endocrine: Denied endocrine symptoms including hot flashes and night sweats.   Meds:   Current Outpatient Medications on File Prior to Visit  Medication Sig Dispense Refill  . ascorbic acid (VITAMIN C) 1000 MG tablet Take 1 tablet by mouth daily.    Marland Kitchen aspirin EC 81 MG tablet Take by mouth.    . Biotin 10 MG CAPS Take by mouth.    Marland Kitchen  CALCIUM-MAGNESIUM-ZINC PO Take by mouth.    . cholecalciferol (VITAMIN D) 1000 units  tablet Take 1,000 Units by mouth daily.    Marland Kitchen ibuprofen (ADVIL,MOTRIN) 200 MG tablet Take by mouth.    . Lutein 20 MG CAPS Take by mouth.    Levin Erp SULFATE VAGINAL (TRIMO-SAN) 0.025 % GEL Place in vagina daily. 113.4 g 2  . risedronate (ACTONEL) 35 MG tablet Take 1 tablet (35 mg total) by mouth every 7 (seven) days. with water on empty stomach, nothing by mouth or lie down for next 30 minutes. 13 tablet 3  . SYNTHROID 50 MCG tablet Take 1 tablet once daily 14 tablet 0  . tizanidine (ZANAFLEX) 2 MG capsule Take 1 capsule (2 mg total) by mouth 3 (three) times daily as needed for muscle spasms. May make drowsy, not to drive while taking. 30 capsule 1  . valsartan (DIOVAN) 80 MG tablet Take 1 tablet (80 mg total) by mouth daily. For blood pressure 14 tablet 0  . vitamin A 8000 UNIT capsule Take by mouth.    . vitamin B-12 (CYANOCOBALAMIN) 1000 MCG tablet Take 1,000 mcg by mouth daily.     No current facility-administered medications on file prior to visit.          Objective:     Vitals:   04/01/20 0951  BP: (!) 156/87   Filed Weights   04/01/20 0951  Weight: 151 lb 9.6 oz (68.8 kg)    Vagina checked by speculum exam- without erosions - pessary replaced.    Assessment:    No obstetric history on file. Patient Active Problem List   Diagnosis Date Noted  . Postoperative hypothyroidism 12/06/2018  . S/P parathyroidectomy ([place]) 12/06/2018  . Vaginal odor 12/12/2017  . Mixed hyperlipidemia 12/04/2017  . Incomplete uterovaginal prolapse 05/03/2016  . Vitamin D deficiency 03/31/2016  . OA (osteoarthritis) of knee 10/25/2015  . Nocturia 06/02/2015  . Cystocele, midline 06/02/2015  . Breast cancer screening 03/10/2015  . OA (osteoarthritis) of hip 08/26/2014  . Hypercalcemia 07/06/2014  . 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 Hypothyroid 04/10/2013  . Essential  hypertension 04/10/2013  . Arthritis 04/10/2013  . Osteoporosis 04/10/2013     1. 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 Pessary maintenance     Erosions resolved with time and twice weekly estrogen cream.   Plan:            1.  Pessary replaced  2.  Patient to continue vaginal estrogen cream/triamcinolone as directed  3.  We will schedule pessary maintenance in 6 months but patient instructed to call and be seen earlier if she evidences any vaginal bleeding. Orders No orders of the defined types were placed in this encounter.   No orders of the defined types were placed in this encounter.     F/U  Return in about 6 months (around 10/01/2020). I spent 23 minutes involved in the care of this patient preparing to see the patient by obtaining and reviewing her medical history (including labs, imaging tests and prior procedures), documenting clinical information in the electronic health record (EHR), counseling and coordinating care plans, writing and sending prescriptions, ordering tests or procedures and directly communicating with the patient by discussing pertinent items from her history and physical exam as well as detailing my assessment and plan as noted above so that she has an informed understanding.  All of her questions were answered.  Finis Bud, M.D. 04/01/2020 10:25 AM

## 2020-04-08 ENCOUNTER — Other Ambulatory Visit: Payer: Self-pay

## 2020-04-08 ENCOUNTER — Other Ambulatory Visit: Payer: Self-pay | Admitting: Family Medicine

## 2020-04-08 DIAGNOSIS — E890 Postprocedural hypothyroidism: Secondary | ICD-10-CM

## 2020-04-08 MED ORDER — SYNTHROID 50 MCG PO TABS: ORAL_TABLET | ORAL | 3 refills | Status: DC

## 2020-04-08 NOTE — Telephone Encounter (Signed)
Medication Refill - Medication:   SYNTHROID 50 MCG tablet     Preferred Pharmacy (with phone number or street name):  [facility], [place] Phone:  [phone]  Fax:  [phone]       Agent: Please be advised that RX refills may take up to 3 business days. We ask that you follow-up with your pharmacy.

## 2020-04-09 ENCOUNTER — Other Ambulatory Visit: Payer: Self-pay

## 2020-04-09 NOTE — Telephone Encounter (Signed)
Was seen 3/21

## 2020-04-12 MED ORDER — VALSARTAN 80 MG PO TABS: 80.0000 mg | ORAL_TABLET | Freq: Every day | ORAL | 1 refills | Status: DC

## 2020-08-05 ENCOUNTER — Other Ambulatory Visit: Payer: Self-pay | Admitting: Obstetrics and Gynecology

## 2020-09-01 ENCOUNTER — Encounter: Payer: Self-pay | Admitting: Obstetrics and Gynecology

## 2020-09-01 ENCOUNTER — Other Ambulatory Visit: Payer: Self-pay

## 2020-09-01 ENCOUNTER — Ambulatory Visit: Payer: Medicare PPO | Admitting: Obstetrics and Gynecology

## 2020-09-01 VITALS — BP 154/86 | HR 72 | Resp 16 | Ht 65.5 in | Wt 151.5 lb

## 2020-09-01 DIAGNOSIS — Z4689 Encounter for fitting and adjustment of other specified devices: Secondary | ICD-10-CM | POA: Diagnosis not present

## 2020-09-01 NOTE — Progress Notes (Signed)
HPI:      [name] is a 85 y.o. No obstetric history on file. who LMP was No LMP recorded. Patient is postmenopausal.  Subjective:   She presents today for pessary care.  She reports that she occasionally has a small amount of spotting but this usually only occurs after she has delayed the use of her triamcinolone cream.  She reports that when she uses the cream the spotting always resolves.    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 She does not have any pertinent problems on file. She  has a past surgical history that includes Hernia repair; Tonsillectomy; Thyroidectomy; Total hip arthroplasty; Cataract extraction; Thyroglossal duct cyst; Tonsillectomy; Total hip arthroplasty (Right, 08/26/2014); Eye surgery; Eye surgery; and Total knee arthroplasty (Left, 10/25/2015). Her family history includes Alzheimer's disease in her mother; Breast cancer in her paternal aunt; Cancer in her father; Diabetes in her son; Hypertension in her father; Stroke in her paternal grandfather; Tuberculosis in her paternal grandmother. She  reports that she has never smoked. She has never used smokeless tobacco. She reports that she does not drink alcohol and does not use drugs. She has a current medication list which includes the following prescription(s): ascorbic acid, aspirin ec, biotin, calcium-magnesium-zinc,  cholecalciferol, ibuprofen, lutein, risedronate, synthroid, tizanidine, trimo-san, valsartan, vitamin a, and vitamin b-12. She has No Known Allergies.       Review of Systems:  Review of Systems  Constitutional: Denied constitutional symptoms, night sweats, recent illness, fatigue, fever, insomnia and weight loss.  Eyes: Denied eye symptoms, eye pain, photophobia, vision change and visual disturbance.  Ears/Nose/Throat/Neck: Denied ear, nose, throat or neck symptoms, hearing loss, nasal discharge, sinus congestion and sore throat.  Cardiovascular: Denied cardiovascular symptoms, arrhythmia, chest pain/pressure, edema, exercise intolerance, orthopnea and palpitations.  Respiratory: Denied pulmonary symptoms, asthma, pleuritic pain, productive sputum, cough, dyspnea and wheezing.  Gastrointestinal: Denied, gastro-esophageal reflux, melena, nausea and vomiting.  Genitourinary: Denied genitourinary symptoms including symptomatic vaginal discharge, pelvic relaxation issues, and urinary complaints.  Musculoskeletal: Denied musculoskeletal symptoms, stiffness, swelling, muscle weakness and myalgia.  Dermatologic: Denied dermatology symptoms, rash and scar.  Neurologic: Denied neurology symptoms, dizziness, headache, neck pain and syncope.  Psychiatric: Denied psychiatric symptoms, anxiety and depression.  Endocrine: Denied endocrine symptoms including hot flashes and night sweats.   Meds:   Current Outpatient Medications on File Prior to Visit  Medication Sig Dispense Refill   ascorbic acid (VITAMIN C) 1000 MG tablet Take 1 tablet by mouth daily.     aspirin EC 81 MG tablet Take by mouth.     Biotin 10 MG CAPS Take by mouth.     CALCIUM-MAGNESIUM-ZINC PO Take by mouth.     cholecalciferol (VITAMIN D) 1000 units tablet Take 1,000 Units by mouth daily.     ibuprofen (ADVIL,MOTRIN) 200 MG tablet Take by mouth.     Lutein 20 MG CAPS Take by mouth.     risedronate (ACTONEL) 35 MG tablet Take 1  tablet (35 mg total) by  mouth every 7 (seven) days. with water on empty stomach, nothing by mouth or lie down for next 30 minutes. 13 tablet 3   SYNTHROID 50 MCG tablet Take 1 tablet once daily 90 tablet 3   tizanidine (ZANAFLEX) 2 MG capsule Take 1 capsule (2 mg total) by mouth 3 (three) times daily as needed for muscle spasms. May make drowsy, not to drive while taking. 30 capsule 1   TRIMO-SAN 0.025-0.01 % GEL Place in vagina daily. 113.4 g 0   valsartan (DIOVAN) 80 MG tablet Take 1 tablet (80 mg total) by mouth daily. For blood pressure 90 tablet 1   vitamin A 8000 UNIT capsule Take by mouth.     vitamin B-12 (CYANOCOBALAMIN) 1000 MCG tablet Take 1,000 mcg by mouth daily.     No current facility-administered medications on file prior to visit.      Objective:     Vitals:   09/01/20 1048  BP: (!) 154/86  Pulse: 72  Resp: 16   Filed Weights   09/01/20 1048  Weight: 151 lb 8 oz (68.7 kg)              Pessary Care Pessary removed and cleaned.  Vagina checked by speculum exam- without erosions - pessary replaced.           Assessment:    No obstetric history on file. 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 Pessary maintenance     Patient doing well with pessary.   Plan:            1.  Encouraged use of triamcinolone cream as scheduled.  2.  Follow-up in 6  months for pessary care. Orders No orders of the defined types were placed in this encounter.   No orders of the defined types were placed in this encounter.     F/U  Return in about 6 months (around 03/01/2021). I spent 21 minutes involved in the care of this patient preparing to see the patient by obtaining and reviewing her medical history (including labs, imaging tests and prior procedures), documenting clinical information in the electronic health record (EHR), counseling and coordinating care plans, writing and sending prescriptions, ordering tests or procedures and in direct communicating with the patient and medical staff discussing pertinent items from her history and physical exam.  Finis Bud, M.D. 09/01/2020 11:15 AM

## 2020-09-28 DIAGNOSIS — L570 Actinic keratosis: Secondary | ICD-10-CM | POA: Diagnosis not present

## 2020-09-28 DIAGNOSIS — Z872 Personal history of diseases of the skin and subcutaneous tissue: Secondary | ICD-10-CM | POA: Diagnosis not present

## 2020-09-28 DIAGNOSIS — Z86018 Personal history of other benign neoplasm: Secondary | ICD-10-CM | POA: Diagnosis not present

## 2020-09-28 DIAGNOSIS — L821 Other seborrheic keratosis: Secondary | ICD-10-CM | POA: Diagnosis not present

## 2020-10-08 ENCOUNTER — Other Ambulatory Visit: Payer: Self-pay | Admitting: Family Medicine

## 2020-10-08 NOTE — Telephone Encounter (Signed)
Pt needs an appointment for further refills. Thanks!

## 2020-10-08 NOTE — Telephone Encounter (Signed)
Lvm that wil lneed appt fwith doctor for the next refills

## 2020-10-26 DIAGNOSIS — H43391 Other vitreous opacities, right eye: Secondary | ICD-10-CM | POA: Diagnosis not present

## 2020-10-26 DIAGNOSIS — H43813 Vitreous degeneration, bilateral: Secondary | ICD-10-CM | POA: Diagnosis not present

## 2020-10-26 DIAGNOSIS — H35373 Puckering of macula, bilateral: Secondary | ICD-10-CM | POA: Diagnosis not present

## 2020-10-26 DIAGNOSIS — H34832 Tributary (branch) retinal vein occlusion, left eye, with macular edema: Secondary | ICD-10-CM | POA: Diagnosis not present

## 2020-11-05 ENCOUNTER — Other Ambulatory Visit: Payer: Self-pay | Admitting: Family Medicine

## 2020-11-17 ENCOUNTER — Other Ambulatory Visit: Payer: Self-pay | Admitting: Obstetrics and Gynecology

## 2020-12-02 ENCOUNTER — Other Ambulatory Visit: Payer: Self-pay | Admitting: Family Medicine

## 2020-12-09 ENCOUNTER — Other Ambulatory Visit: Payer: Self-pay

## 2020-12-09 ENCOUNTER — Ambulatory Visit: Payer: Medicare PPO | Admitting: Nurse Practitioner

## 2020-12-09 ENCOUNTER — Encounter: Payer: Self-pay | Admitting: Nurse Practitioner

## 2020-12-09 VITALS — BP 130/72 | HR 71 | Temp 98.1°F | Resp 16 | Ht 66.0 in | Wt 149.9 lb

## 2020-12-09 DIAGNOSIS — E890 Postprocedural hypothyroidism: Secondary | ICD-10-CM | POA: Diagnosis not present

## 2020-12-09 DIAGNOSIS — Z79899 Other long term (current) drug therapy: Secondary | ICD-10-CM

## 2020-12-09 DIAGNOSIS — I10 Essential (primary) hypertension: Secondary | ICD-10-CM

## 2020-12-09 DIAGNOSIS — Z13220 Encounter for screening for lipoid disorders: Secondary | ICD-10-CM | POA: Diagnosis not present

## 2020-12-09 MED ORDER — SYNTHROID 50 MCG PO TABS: ORAL_TABLET | ORAL | 3 refills | Status: DC

## 2020-12-09 MED ORDER — VALSARTAN 80 MG PO TABS: 80.0000 mg | ORAL_TABLET | Freq: Every day | ORAL | 3 refills | Status: DC

## 2020-12-09 NOTE — Progress Notes (Signed)
BP 130/72   Pulse 71   Temp 98.1 F (36.7 C) (Oral)   Resp 16   Ht 5\' 6"  (1.676 m)   Wt 149 lb 14.4 oz (68 kg)   SpO2 97%   BMI 24.19 kg/m    Subjective:    Patient ID: Haley Schneider, female    DOB: [birth year], 85 y.o.   MRN: [id number]  HPI: Haley Schneider is a 85 y.o. female, here alone  Chief Complaint  Patient presents with   Hypothyroidism    Follow up, medication refills   Hypertension   Hypothyroid: She says that she has had no symptoms with her thyroid.  She is currently taking synthroid 50 mcg daily.  She denies any weight loss or gain, heat or cold sensitivity, chest pain or palpitations.    HTN:  She says her blood pressure has been good except for one time when she went to the GYN office it was high. She says she has been doing well on on her current dose of valsartan 80 mg daily.  She denies any chest pain, shortness of breath, headache, or blurred vision.  Her blood pressure in the office is 130/72.  Will refill medication.   Relevant past medical, surgical, family and social history reviewed and updated as indicated. Interim medical history since our last visit reviewed. Allergies and medications reviewed and updated.  Review of Systems  Constitutional: Negative for fever or weight change.  Respiratory: Negative for cough and shortness of breath.   Cardiovascular: Negative for chest pain or palpitations.  Gastrointestinal: Negative for abdominal pain, no bowel changes.  Musculoskeletal: Negative for gait problem or joint swelling.  Skin: Negative for rash.  Neurological: Negative for dizziness or headache.  No other specific complaints in a complete review of systems (except as listed in HPI above).      Objective:    BP 130/72   Pulse 71   Temp 98.1 F (36.7 C) (Oral)   Resp 16   Ht 5\' 6"  (1.676 m)   Wt 149 lb 14.4 oz (68 kg)   SpO2 97%   BMI 24.19 kg/m   Wt Readings from Last 3 Encounters:  12/09/20 149 lb 14.4 oz (68 kg)  09/01/20 151  lb 8 oz (68.7 kg)  04/01/20 151 lb 9.6 oz (68.8 kg)    Physical Exam  Constitutional: Patient appears well-developed and well-nourished. No distress.  HEENT: head atraumatic, normocephalic, pupils equal and reactive to light, neck supple Cardiovascular: Normal rate, regular rhythm and normal heart sounds.  No murmur heard. No BLE edema. Pulmonary/Chest: Effort normal and breath sounds normal. No respiratory distress. Abdominal: Soft.  There is no tenderness. Psychiatric: Patient has a normal mood and affect. behavior is normal. Judgment and thought content normal.   Results for orders placed or performed in visit on 75/64/33  Basic Metabolic Panel (BMET)  Result Value Ref Range   Glucose, Bld 86 65 - 99 mg/dL   BUN 13 7 - 25 mg/dL   Creat 0.62 0.60 - 0.88 mg/dL   BUN/Creatinine Ratio NOT APPLICABLE 6 - 22 (calc)   Sodium 134 (L) 135 - 146 mmol/L   Potassium 4.6 3.5 - 5.3 mmol/L   Chloride 97 (L) 98 - 110 mmol/L   CO2 27 20 - 32 mmol/L   Calcium 9.4 8.6 - 10.4 mg/dL  B12  Result Value Ref Range   Vitamin B-12 >2,000 (H) 200 - 1,100 pg/mL  TSH  Result Value Ref Range  TSH 1.12 0.40 - 4.50 mIU/L  Vitamin A  Result Value Ref Range   Vitamin A (Retinoic Acid) 55 38 - 98 mcg/dL  VITAMIN D 25 Hydroxy (Vit-D Deficiency, Fractures)  Result Value Ref Range   Vit D, 25-Hydroxy 48 30 - 100 ng/mL      Assessment & Plan:   1. Postoperative hypothyroidism  - SYNTHROID 50 MCG tablet; Take 1 tablet once daily  Dispense: 90 tablet; Refill: 3 - TSH  2. Essential hypertension  - valsartan (DIOVAN) 80 MG tablet; Take 1 tablet (80 mg total) by mouth daily. For blood pressure  Dispense: 90 tablet; Refill: 3 - COMPLETE METABOLIC PANEL WITH GFR - CBC with Differential/Platelet  3. Screening for cholesterol level  - Lipid panel  4. Medication management  - TSH - COMPLETE METABOLIC PANEL WITH GFR - CBC with Differential/Platelet   Follow up plan: Return in about 6 months (around  06/09/2021) for cpe.

## 2021-02-15 DIAGNOSIS — H43391 Other vitreous opacities, right eye: Secondary | ICD-10-CM | POA: Diagnosis not present

## 2021-02-15 DIAGNOSIS — H43813 Vitreous degeneration, bilateral: Secondary | ICD-10-CM | POA: Diagnosis not present

## 2021-02-15 DIAGNOSIS — H34832 Tributary (branch) retinal vein occlusion, left eye, with macular edema: Secondary | ICD-10-CM | POA: Diagnosis not present

## 2021-02-15 DIAGNOSIS — H35373 Puckering of macula, bilateral: Secondary | ICD-10-CM | POA: Diagnosis not present

## 2021-03-01 ENCOUNTER — Ambulatory Visit: Payer: Medicare PPO | Admitting: Obstetrics and Gynecology

## 2021-03-01 ENCOUNTER — Encounter: Payer: Self-pay | Admitting: Obstetrics and Gynecology

## 2021-03-01 ENCOUNTER — Other Ambulatory Visit: Payer: Self-pay

## 2021-03-01 DIAGNOSIS — B3731 Acute candidiasis of vulva and vagina: Secondary | ICD-10-CM | POA: Diagnosis not present

## 2021-03-01 DIAGNOSIS — Z4689 Encounter for fitting and adjustment of other specified devices: Secondary | ICD-10-CM

## 2021-03-01 MED ORDER — TRIMO-SAN 0.025-0.01 % VA GEL: VAGINAL | 0 refills | Status: DC

## 2021-03-01 MED ORDER — FLUCONAZOLE 150 MG PO TABS: 150.0000 mg | ORAL_TABLET | ORAL | 0 refills | Status: DC

## 2021-03-01 NOTE — Progress Notes (Signed)
HPI:      [name] is a 86 y.o. G2P2002 who LMP was No LMP recorded. Patient is postmenopausal.  Subjective:   She presents today for pessary care.  She reports that she continues to use the triamcinolone cream as directed.  She states that she has had small amount of spotting but this is decreased from her last visit.  She says it has only happened when she is very active.    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 She does not have any pertinent problems on file. She  has a past surgical history that includes Hernia repair; Tonsillectomy; Thyroidectomy; Total hip arthroplasty; Cataract extraction; Thyroglossal duct cyst; Tonsillectomy; Total hip arthroplasty (Right, 08/26/2014); Eye surgery; Eye surgery; and Total knee arthroplasty (Left, 10/25/2015). Her family history includes Alzheimer's disease in her mother; Breast cancer in her paternal aunt; Cancer in her father; Diabetes in her son; Hypertension in her father; Stroke in her paternal grandfather; Tuberculosis in her paternal grandmother. She  reports that she has never smoked. She has never used smokeless tobacco. She reports that she does not drink alcohol and does not use drugs. She has a current medication list which includes the following prescription(s): ascorbic acid, aspirin ec, biotin, calcium-magnesium-zinc, cholecalciferol, fluconazole,  ibuprofen, lutein, synthroid, valsartan, vitamin a, vitamin b-12, trimo-san, and risedronate. She has No Known Allergies.       Review of Systems:  Review of Systems  Constitutional: Denied constitutional symptoms, night sweats, recent illness, fatigue, fever, insomnia and weight loss.  Eyes: Denied eye symptoms, eye pain, photophobia, vision change and visual disturbance.  Ears/Nose/Throat/Neck: Denied ear, nose, throat or neck symptoms, hearing loss, nasal discharge, sinus congestion and sore throat.  Cardiovascular: Denied cardiovascular symptoms, arrhythmia, chest pain/pressure, edema, exercise intolerance, orthopnea and palpitations.  Respiratory: Denied pulmonary symptoms, asthma, pleuritic pain, productive sputum, cough, dyspnea and wheezing.  Gastrointestinal: Denied, gastro-esophageal reflux, melena, nausea and vomiting.  Genitourinary: Denied genitourinary symptoms including symptomatic vaginal discharge, pelvic relaxation issues, and urinary complaints.  Musculoskeletal: Denied musculoskeletal symptoms, stiffness, swelling, muscle weakness and myalgia.  Dermatologic: Denied dermatology symptoms, rash and scar.  Neurologic: Denied neurology symptoms, dizziness, headache, neck pain and syncope.  Psychiatric: Denied psychiatric symptoms, anxiety and depression.  Endocrine: Denied endocrine symptoms including hot flashes and night sweats.   Meds:   Current Outpatient Medications on File Prior to Visit  Medication Sig Dispense Refill   ascorbic acid (VITAMIN C) 1000 MG tablet Take 1 tablet by mouth daily.     aspirin EC 81 MG tablet Take by mouth.     Biotin 10 MG CAPS Take by mouth.     CALCIUM-MAGNESIUM-ZINC PO Take by mouth.     cholecalciferol (VITAMIN D) 1000 units tablet Take 1,000 Units by mouth daily.     ibuprofen (ADVIL,MOTRIN) 200 MG tablet Take by mouth.     Lutein 20 MG CAPS Take by mouth.     SYNTHROID 50 MCG tablet Take 1 tablet once daily 90 tablet 3  valsartan  (DIOVAN) 80 MG tablet Take 1 tablet (80 mg total) by mouth daily. For blood pressure 90 tablet 3   vitamin A 8000 UNIT capsule Take by mouth.     vitamin B-12 (CYANOCOBALAMIN) 1000 MCG tablet Take 1,000 mcg by mouth daily.     risedronate (ACTONEL) 35 MG tablet Take 1 tablet (35 mg total) by mouth every 7 (seven) days. with water on empty stomach, nothing by mouth or lie down for next 30 minutes. (Patient not taking: Reported on 03/01/2021) 13 tablet 3   No current facility-administered medications on file prior to visit.      Objective:     There were no vitals filed for this visit. There were no vitals filed for this visit.            Pessary Care Pessary removed and cleaned.  Vagina checked by speculum exam- without erosions -thick white vaginal discharge noted likely monilia infection.  Pessary replaced.           Assessment:    G2P2002 Patient Active Problem List   Diagnosis Date Noted   Postoperative hypothyroidism 12/06/2018   S/P parathyroidectomy ([place]) 12/06/2018   Vaginal odor 12/12/2017   Mixed hyperlipidemia 12/04/2017   Incomplete uterovaginal prolapse 05/03/2016   Vitamin D deficiency 03/31/2016   OA (osteoarthritis) of knee 10/25/2015   Nocturia 06/02/2015   Cystocele, midline 06/02/2015   Breast cancer screening 03/10/2015   OA (osteoarthritis) of hip 08/26/2014   Hypercalcemia 07/06/2014   Vaginal erosion secondary to pessary use ([place]) 07/01/2014   Hypothyroid 04/10/2013   Essential hypertension 04/10/2013   Arthritis 04/10/2013   Osteoporosis 04/10/2013     1. Pessary maintenance   2. Monilial vulvovaginitis        Plan:            1.  Diflucan for monilia  2.  Patient doing well with pessary-continue triamcinolone use.  3.  Follow-up 6 months. Orders No orders of the defined types were placed in this encounter.    Meds ordered this encounter  Medications   Oxyquinoline-Sod Lauryl Sulf (TRIMO-SAN) 0.025-0.01 % GEL    Sig: Place in  vagina daily.    Dispense:  113.4 g    Refill:  0   fluconazole (DIFLUCAN) 150 MG tablet    Sig: Take 1 tablet (150 mg total) by mouth every 3 (three) days. For three doses    Dispense:  3 tablet    Refill:  0      F/U  Return in about 6 months (around 08/29/2021). I spent 21 minutes involved in the care of this patient preparing to see the patient by obtaining and reviewing her medical history (including labs, imaging tests and prior procedures), documenting clinical information in the electronic health record (EHR), counseling and coordinating care plans, writing and sending prescriptions, ordering tests or procedures and in direct communicating with the patient and medical staff discussing pertinent items from her history and physical exam.  Finis Bud, M.D. 03/01/2021 11:23 AM

## 2021-03-01 NOTE — Progress Notes (Signed)
Patient presents for pessary care today. Patient reports no pain or discomfort. She states recent bleeding on 2/25 that resolved quickly. Patient states no other questions or concerns at this time.

## 2021-03-25 ENCOUNTER — Other Ambulatory Visit: Payer: Self-pay

## 2021-03-25 ENCOUNTER — Encounter: Payer: Self-pay | Admitting: Family Medicine

## 2021-03-25 ENCOUNTER — Ambulatory Visit (INDEPENDENT_AMBULATORY_CARE_PROVIDER_SITE_OTHER): Payer: Medicare PPO | Admitting: Family Medicine

## 2021-03-25 ENCOUNTER — Encounter: Payer: Medicare PPO | Admitting: Family Medicine

## 2021-03-25 VITALS — BP 132/80 | HR 72 | Temp 98.0°F | Resp 16 | Ht 64.0 in | Wt 149.8 lb

## 2021-03-25 DIAGNOSIS — Z23 Encounter for immunization: Secondary | ICD-10-CM | POA: Diagnosis not present

## 2021-03-25 DIAGNOSIS — E559 Vitamin D deficiency, unspecified: Secondary | ICD-10-CM

## 2021-03-25 DIAGNOSIS — I10 Essential (primary) hypertension: Secondary | ICD-10-CM

## 2021-03-25 DIAGNOSIS — E538 Deficiency of other specified B group vitamins: Secondary | ICD-10-CM | POA: Diagnosis not present

## 2021-03-25 DIAGNOSIS — E782 Mixed hyperlipidemia: Secondary | ICD-10-CM | POA: Diagnosis not present

## 2021-03-25 DIAGNOSIS — Z0000 Encounter for general adult medical examination without abnormal findings: Secondary | ICD-10-CM | POA: Diagnosis not present

## 2021-03-25 DIAGNOSIS — Z1231 Encounter for screening mammogram for malignant neoplasm of breast: Secondary | ICD-10-CM | POA: Diagnosis not present

## 2021-03-25 DIAGNOSIS — Z9889 Other specified postprocedural states: Secondary | ICD-10-CM

## 2021-03-25 DIAGNOSIS — M810 Age-related osteoporosis without current pathological fracture: Secondary | ICD-10-CM

## 2021-03-25 DIAGNOSIS — E890 Postprocedural hypothyroidism: Secondary | ICD-10-CM | POA: Diagnosis not present

## 2021-03-25 DIAGNOSIS — E892 Postprocedural hypoparathyroidism: Secondary | ICD-10-CM

## 2021-03-25 NOTE — Patient Instructions (Addendum)
Health Maintenance  ?Topic Date Due  ? Mammogram  02/05/2020  ? Flu Shot  04/01/2021*  ? COVID-19 Vaccine (3 - Pfizer risk series) 04/10/2021*  ? Zoster (Shingles) Vaccine (1 of 2) 06/25/2021*  ? Tetanus Vaccine  12/09/2021*  ? DEXA scan (bone density measurement)  02/04/2022  ? Pneumonia Vaccine  Completed  ? HPV Vaccine  Aged Out  ?*Topic was postponed. The date shown is not the original due date.  ? ? ?Preventive Care 58 Years and Older, Female ?Preventive care refers to lifestyle choices and visits with your health care provider that can promote health and wellness. Preventive care visits are also called wellness exams. ?What can I expect for my preventive care visit? ?Counseling ?Your health care provider may ask you questions about your: ?Medical history, including: ?Past medical problems. ?Family medical history. ?Pregnancy and menstrual history. ?History of falls. ?Current health, including: ?Memory and ability to understand (cognition). ?Emotional well-being. ?Home life and relationship well-being. ?Sexual activity and sexual health. ?Lifestyle, including: ?Alcohol, nicotine or tobacco, and drug use. ?Access to firearms. ?Diet, exercise, and sleep habits. ?Work and work Statistician. ?Sunscreen use. ?Safety issues such as seatbelt and bike helmet use. ?Physical exam ?Your health care provider will check your: ?Height and weight. These may be used to calculate your BMI (body mass index). BMI is a measurement that tells if you are at a healthy weight. ?Waist circumference. This measures the distance around your waistline. This measurement also tells if you are at a healthy weight and may help predict your risk of certain diseases, such as type 2 diabetes and high blood pressure. ?Heart rate and blood pressure. ?Body temperature. ?Skin for abnormal spots. ?What immunizations do I need? ?Vaccines are usually given at various ages, according to a schedule. Your health care provider will recommend vaccines for you  based on your age, medical history, and lifestyle or other factors, such as travel or where you work. ?What tests do I need? ?Screening ?Your health care provider may recommend screening tests for certain conditions. This may include: ?Lipid and cholesterol levels. ?Hepatitis C test. ?Hepatitis B test. ?HIV (human immunodeficiency virus) test. ?STI (sexually transmitted infection) testing, if you are at risk. ?Lung cancer screening. ?Colorectal cancer screening. ?Diabetes screening. This is done by checking your blood sugar (glucose) after you have not eaten for a while (fasting). ?Mammogram. Talk with your health care provider about how often you should have regular mammograms. ?BRCA-related cancer screening. This may be done if you have a family history of breast, ovarian, tubal, or peritoneal cancers. ?Bone density scan. This is done to screen for osteoporosis. ?Talk with your health care provider about your test results, treatment options, and if necessary, the need for more tests. ?Follow these instructions at home: ?Eating and drinking ? ?Eat a diet that includes fresh fruits and vegetables, whole grains, lean protein, and low-fat dairy products. Limit your intake of foods with high amounts of sugar, saturated fats, and salt. ?Take vitamin and mineral supplements as recommended by your health care provider. ?Do not drink alcohol if your health care provider tells you not to drink. ?If you drink alcohol: ?Limit how much you have to 0-1 drink a day. ?Know how much alcohol is in your drink. In the [place] one drink equals one 12 oz bottle of beer (355 mL), one 5 oz glass of wine (148 mL), or one 1? oz glass of hard liquor (44 mL). ?Lifestyle ?Brush your teeth every morning and night with fluoride toothpaste. Floss  one time each day. ?Exercise for at least 30 minutes 5 or more days each week. ?Do not use any products that contain nicotine or tobacco. These products include cigarettes, chewing tobacco, and vaping  devices, such as e-cigarettes. If you need help quitting, ask your health care provider. ?Do not use drugs. ?If you are sexually active, practice safe sex. Use a condom or other form of protection in order to prevent STIs. ?Take aspirin only as told by your health care provider. Make sure that you understand how much to take and what form to take. Work with your health care provider to find out whether it is safe and beneficial for you to take aspirin daily. ?Ask your health care provider if you need to take a cholesterol-lowering medicine (statin). ?Find healthy ways to manage stress, such as: ?Meditation, yoga, or listening to music. ?Journaling. ?Talking to a trusted person. ?Spending time with friends and family. ?Minimize exposure to UV radiation to reduce your risk of skin cancer. ?Safety ?Always wear your seat belt while driving or riding in a vehicle. ?Do not drive: ?If you have been drinking alcohol. Do not ride with someone who has been drinking. ?When you are tired or distracted. ?While texting. ?If you have been using any mind-altering substances or drugs. ?Wear a helmet and other protective equipment during sports activities. ?If you have firearms in your house, make sure you follow all gun safety procedures. ?What's next? ?Visit your health care provider once a year for an annual wellness visit. ?Ask your health care provider how often you should have your eyes and teeth checked. ?Stay up to date on all vaccines. ?This information is not intended to replace advice given to you by your health care provider. Make sure you discuss any questions you have with your health care provider. ?Document Revised: 06/16/2020 Document Reviewed: 06/16/2020 ?Elsevier Patient Education ? [place]. ? ?

## 2021-03-25 NOTE — Progress Notes (Signed)
? ? ?Patient: Haley Schneider, Female    DOB: September 25, 1935, 86 y.o.   MRN: [id number] ? ?Visit Date: 03/25/2021 ? ?Today's Provider: Delsa Grana, PA-C  ? ?Chief Complaint  ?Patient presents with  ? Annual Exam  ? ?Subjective:  ? ?Annual physical exam: ? ?Haley Schneider is a 86 y.o. female who presents today for complete physical exam: ? ?Exercise/Activity:  low impact - mostly walking around ?Diet/nutrition:  healthy and well balanced but recently allowing more sweets/ice cream ?Sleep: no concerns ? ?Clinical Intake: ? ?Pre-visit preparation completed: No ? ?Pain : No/denies pain ? ?  ? ?BMI - recorded: 25.71 ?Nutritional Status: BMI 25 -29 Overweight ?Nutritional Risks: None ?Diabetes: No ? ?Activities of Daily Living: Independent ?Ambulation: Independent ?Medication Administration: Independent ?Home Management: Independent ? ?Barriers to Care Management & Learning: None ? ?Do you feel unsafe in your current relationship?: No ?Do you feel physically threatened by others?: No ?Anyone hurting you at home, work, or school?: No ?Unable to ask?: No ?Information provided on Community resources: No ? ?How often do you need to have someone help you when you read instructions, pamphlets, or other written materials from your doctor or pharmacy?: 1 - Never ? ?Interpreter Needed?: No ? ?Information entered by :: Ginette Bradway ? ?Cardiac Risk Factors include: dyslipidemia;hypertension;advanced age (>29mn, >>69women) ? ?   ? ?  03/25/2021  ? 10:53 AM 02/04/2019  ? 10:22 AM 12/04/2017  ?  9:59 AM 03/08/2016  ? 10:50 AM 10/25/2015  ?  1:00 PM 10/18/2015  ? 12:36 PM 09/01/2015  ?  9:08 AM  ?Advanced Directives  ?Does Patient Have a Medical Advance Directive? Yes Yes Yes Yes Yes Yes Yes  ?Type of AParamedicof [place] CenterLiving will [place] will Living will;Healthcare Power of Attorney  Living will;Healthcare Power of Attorney Living will;Healthcare Power of Attorney Living will  ?Does  patient want to make changes to medical advance directive?     No - Patient declined No - Patient declined   ?Copy of [place]?  No - copy requested No - copy requested  No - copy requested No - copy requested No - copy requested  ? ? ?Tobacco ?Social History  ? ?Tobacco Use  ?Smoking Status Never  ?Smokeless Tobacco Never  ?   ?Counseling given: Not Answered ? ? ? ? ?Activities of Daily Living ? ?  03/25/2021  ? 10:14 AM 12/09/2020  ? 10:35 AM  ?In your present state of health, do you have any difficulty performing the following activities:  ?Hearing? 0 0  ?Vision? 1 0  ?Difficulty concentrating or making decisions? 0 0  ?Walking or climbing stairs? 0 0  ?Dressing or bathing? 0 0  ?Doing errands, shopping? 0 0  ?Preparing Food and eating ? N   ?Using the Toilet? N   ?In the past six months, have you accidently leaked urine? Y   ?Do you have problems with loss of bowel control? N   ?Managing your Medications? N   ?Managing your Finances? N   ?Housekeeping or managing your Housekeeping? N   ? ? ?Exercise Activities and Dietary recommendations ?Current Exercise Habits: Home exercise routine, Type of exercise: walking, Frequency (Times/Week): 5, Intensity: Mild, Exercise limited by: orthopedic condition(s) ? ? Goals   ? ?  Increase physical activity   ?  Recommend increasing physical activity to 150 minutes per week ?  ? ?  ? ? ?Fall Risk ? ?  03/25/2021  ? 10:14 AM 12/09/2020  ? 10:35 AM 09/01/2020  ? 10:49 AM 03/22/2020  ? 10:09 AM 05/16/2019  ?  2:10 PM  ?Fall Risk   ?Falls in the past year? 0 0 0 1 1  ?Number falls in past yr: 0 0 0 0 0  ?Injury with Fall? 0 0 0 1 1  ?Risk for fall due to : No Fall Risks      ?Follow up Falls prevention discussed Falls evaluation completed     ? ?Is the patient's home free of loose throw rugs in walkways, pet beds, electrical cords, etc?   yes ?Discussed avoiding falls - no loose rugs, cords, rails for stairs and in bathroom ? ?Depression Screen ? ?  03/25/2021   ? 10:14 AM 12/09/2020  ? 10:35 AM 03/22/2020  ? 10:09 AM 05/16/2019  ?  2:10 PM  ?PHQ 2/9 Scores  ?PHQ - 2 Score 0 0 0 0  ?PHQ- 9 Score 0   0  ?  ? ?Cognitive Function - pt refused to do MME and 6CIT screening today  ?  ?  ? ?  12/04/2017  ? 10:18 AM  ?6CIT Screen  ?What Year? 0 points  ?What month? 0 points  ?What time? 0 points  ?Count back from 20 0 points  ?Months in reverse 0 points  ?Repeat phrase 0 points  ?Total Score 0 points  ? ? ?Immunization History  ?Administered Date(s) Administered  ? Influenza-Unspecified 12/11/2013  ? PFIZER(Purple Top)SARS-COV-2 Vaccination 01/09/2019, 01/30/2019  ? Pneumococcal Conjugate-13 03/10/2015  ? Pneumococcal Polysaccharide-23 11/09/2016  ? ? ?Qualifies for Shingles Vaccine? Yes - not interested ? ?Screening Tests ?Health Maintenance  ?Topic Date Due  ? MAMMOGRAM  02/05/2020  ? INFLUENZA VACCINE  04/01/2021 (Originally 08/02/2020)  ? COVID-19 Vaccine (3 - Pfizer risk series) 04/10/2021 (Originally 02/27/2019)  ? Zoster Vaccines- Shingrix (1 of 2) 06/25/2021 (Originally 03/03/1954)  ? TETANUS/TDAP  12/09/2021 (Originally 03/03/1954)  ? DEXA SCAN  02/04/2022  ? Pneumonia Vaccine 35+ Years old  Completed  ? HPV VACCINES  Aged Out  ? ? ?Cancer Screenings: ?Lung: Low Dose CT Chest recommended if Age 41-80 years, 30 pack-year currently smoking OR have quit w/in 15years. Patient does not qualify. ?Breast:  Up to date on Mammogram? No   ?Up to date of Bone Density/Dexa? No ?Colorectal: aged out ? ?Additional Screenings: : ?Hepatitis C Screening: refused screening ? ?   ? ? ?SDOH Screenings  ? ?Alcohol Screen: Low Risk   ? Last Alcohol Screening Score (AUDIT): 0  ?Depression (PHQ2-9): Low Risk   ? PHQ-2 Score: 0  ?Financial Resource Strain: Low Risk   ? Difficulty of Paying Living Expenses: Not hard at all  ?Food Insecurity: No Food Insecurity  ? Worried About Charity fundraiser in the Last Year: Never true  ? Ran Out of Food in the Last Year: Never true  ?Housing: Low Risk   ? Last Housing  Risk Score: 0  ?Physical Activity: Insufficiently Active  ? Days of Exercise per Week: 5 days  ? Minutes of Exercise per Session: 20 min  ?Social Connections: Moderately Isolated  ? Frequency of Communication with Friends and Family: More than three times a week  ? Frequency of Social Gatherings with Friends and Family: Twice a week  ? Attends Religious Services: 1 to 4 times per year  ? Active Member of Clubs or Organizations: No  ? Attends Archivist Meetings: Never  ? Marital Status: Widowed  ?  Stress: No Stress Concern Present  ? Feeling of Stress : Only a little  ?Tobacco Use: Low Risk   ? Smoking Tobacco Use: Never  ? Smokeless Tobacco Use: Never  ? Passive Exposure: Not on file  ?Transportation Needs: No Transportation Needs  ? Lack of Transportation (Medical): No  ? Lack of Transportation (Non-Medical): No  ? ?USPSTF grade A and B recommendations - reviewed and addressed today ? ?STD testing and prevention (HIV/chl/gon/syphilis):  see above, no additional testing desired by pt today ? ?Intimate partner violence:  safe at home, lives alone  ? ?Sexual History/Pain during Intercourse: Widowed ? ?Menstrual History/LMP/Abnormal Bleeding:  none ?No LMP recorded. Patient is postmenopausal. ? ?Incontinence Symptoms: managing with specialists, pessary ? ?Breast cancer:  could do - discussed cancer screening  ?Last Mammogram: *see HM list above ?BRCA gene screening:    ? ?Cervical cancer screening: aged out ? ?Osteoporosis:   ?Discussion on osteoporosis per age, including high calcium and vitamin D supplementation, weight bearing exercises ?Pt is  supplementing with daily calcium/Vit D. ?Could repeat to recheck risk - discussed dexa ?Failed and did not tolerate fosamax and other meds in same class due to severe bone pain/arthritis sx ? ?Skin cancer:  Hx of skin CA -  NO ?Discussed atypical lesions  ? ?Colorectal cancer:   ?Colonoscopy is utd/aged out ?Discussed concerning signs and sx of CRC, ? ?Social  History  ? ?Tobacco Use  ? Smoking status: Never  ? Smokeless tobacco: Never  ?Vaping Use  ? Vaping Use: Never used  ?Substance Use Topics  ? Alcohol use: No  ? Drug use: No  ?  ? ?[facility] Office Visit fr

## 2021-03-26 LAB — CBC WITH DIFFERENTIAL/PLATELET
Absolute Monocytes: 616 cells/uL (ref 200–950)
Basophils Absolute: 53 cells/uL (ref 0–200)
Basophils Relative: 0.6 %
Eosinophils Absolute: 62 cells/uL (ref 15–500)
Eosinophils Relative: 0.7 %
HCT: 41.9 % (ref 35.0–45.0)
Hemoglobin: 13.8 g/dL (ref 11.7–15.5)
Lymphs Abs: 1901 cells/uL (ref 850–3900)
MCH: 27.5 pg (ref 27.0–33.0)
MCHC: 32.9 g/dL (ref 32.0–36.0)
MCV: 83.6 fL (ref 80.0–100.0)
MPV: 9.9 fL (ref 7.5–12.5)
Monocytes Relative: 7 %
Neutro Abs: 6169 cells/uL (ref 1500–7800)
Neutrophils Relative %: 70.1 %
Platelets: 350 10*3/uL (ref 140–400)
RBC: 5.01 10*6/uL (ref 3.80–5.10)
RDW: 13.9 % (ref 11.0–15.0)
Total Lymphocyte: 21.6 %
WBC: 8.8 10*3/uL (ref 3.8–10.8)

## 2021-03-26 LAB — COMPLETE METABOLIC PANEL WITH GFR
AG Ratio: 1.6 (calc) (ref 1.0–2.5)
ALT: 11 U/L (ref 6–29)
AST: 15 U/L (ref 10–35)
Albumin: 4.1 g/dL (ref 3.6–5.1)
Alkaline phosphatase (APISO): 88 U/L (ref 37–153)
BUN: 12 mg/dL (ref 7–25)
CO2: 27 mmol/L (ref 20–32)
Calcium: 9.6 mg/dL (ref 8.6–10.4)
Chloride: 97 mmol/L — ABNORMAL LOW (ref 98–110)
Creat: 0.65 mg/dL (ref 0.60–0.95)
Globulin: 2.6 g/dL (calc) (ref 1.9–3.7)
Glucose, Bld: 84 mg/dL (ref 65–99)
Potassium: 4.5 mmol/L (ref 3.5–5.3)
Sodium: 133 mmol/L — ABNORMAL LOW (ref 135–146)
Total Bilirubin: 0.4 mg/dL (ref 0.2–1.2)
Total Protein: 6.7 g/dL (ref 6.1–8.1)
eGFR: 87 mL/min/{1.73_m2} (ref >=60)

## 2021-03-26 LAB — LIPID PANEL
Cholesterol: 203 mg/dL — ABNORMAL HIGH (ref <200)
HDL: 57 mg/dL (ref >=50)
LDL Cholesterol (Calc): 124 mg/dL (calc) — ABNORMAL HIGH
Non-HDL Cholesterol (Calc): 146 mg/dL (calc) — ABNORMAL HIGH (ref <130)
Total CHOL/HDL Ratio: 3.6 (calc) (ref <5.0)
Triglycerides: 117 mg/dL (ref <150)

## 2021-03-26 LAB — VITAMIN B12: Vitamin B-12: 375 pg/mL (ref 200–1100)

## 2021-03-26 LAB — TSH: TSH: 0.84 mIU/L (ref 0.40–4.50)

## 2021-03-26 LAB — VITAMIN D 25 HYDROXY (VIT D DEFICIENCY, FRACTURES): Vit D, 25-Hydroxy: 59 ng/mL (ref 30–100)

## 2021-03-28 ENCOUNTER — Other Ambulatory Visit: Payer: Self-pay | Admitting: Obstetrics and Gynecology

## 2021-03-28 DIAGNOSIS — Z4689 Encounter for fitting and adjustment of other specified devices: Secondary | ICD-10-CM

## 2021-03-29 ENCOUNTER — Other Ambulatory Visit: Payer: Self-pay

## 2021-03-29 DIAGNOSIS — Z4689 Encounter for fitting and adjustment of other specified devices: Secondary | ICD-10-CM

## 2021-03-29 MED ORDER — TRIMO-SAN 0.025-0.01 % VA GEL: VAGINAL | 0 refills | Status: DC

## 2021-04-05 ENCOUNTER — Other Ambulatory Visit: Payer: Self-pay | Admitting: Family Medicine

## 2021-04-05 DIAGNOSIS — E890 Postprocedural hypothyroidism: Secondary | ICD-10-CM

## 2021-04-06 NOTE — Telephone Encounter (Signed)
Refused Synthroid 50 mcg tablets because requested too early. ?

## 2021-08-09 DIAGNOSIS — H35373 Puckering of macula, bilateral: Secondary | ICD-10-CM | POA: Diagnosis not present

## 2021-08-09 DIAGNOSIS — H34832 Tributary (branch) retinal vein occlusion, left eye, with macular edema: Secondary | ICD-10-CM | POA: Diagnosis not present

## 2021-08-09 DIAGNOSIS — H43813 Vitreous degeneration, bilateral: Secondary | ICD-10-CM | POA: Diagnosis not present

## 2021-08-09 DIAGNOSIS — H43391 Other vitreous opacities, right eye: Secondary | ICD-10-CM | POA: Diagnosis not present

## 2021-08-30 ENCOUNTER — Ambulatory Visit: Payer: Medicare PPO | Admitting: Obstetrics and Gynecology

## 2021-08-30 ENCOUNTER — Other Ambulatory Visit: Payer: Self-pay

## 2021-08-30 ENCOUNTER — Encounter: Payer: Self-pay | Admitting: Obstetrics and Gynecology

## 2021-08-30 VITALS — BP 160/87 | HR 70 | Ht 64.0 in | Wt 145.7 lb

## 2021-08-30 DIAGNOSIS — N3941 Urge incontinence: Secondary | ICD-10-CM | POA: Diagnosis not present

## 2021-08-30 DIAGNOSIS — Z4689 Encounter for fitting and adjustment of other specified devices: Secondary | ICD-10-CM

## 2021-08-30 MED ORDER — OXYBUTYNIN CHLORIDE ER 10 MG PO TB24: 10.0000 mg | ORAL_TABLET | Freq: Every day | ORAL | 2 refills | Status: DC

## 2021-08-30 NOTE — Progress Notes (Signed)
Patient presents for 6 month follow-up for pessary. She states no bleeding or abnormal discharge. Patient states complaints of urge incontinence and occasional leakage. No other concerns at this time.

## 2021-08-30 NOTE — Progress Notes (Signed)
HPI:      [name] is a 86 y.o. G2P2002 who LMP was No LMP recorded. Patient is postmenopausal.  Subjective:   She presents today for pessary follow-up.  She reports that she continues to have urge incontinence but states that it is "no worse".  We have discussed this in some detail in her description does not seem like it is true urge incontinence. She reports no vaginal bleeding or any other issues with her current pessary.    Hx: The following portions of the patient's history were reviewed and updated as appropriate:             She  has a past medical history of Arthritis, Fibroid, uterine, GERD (gastroesophageal reflux disease), History of abnormal cervical Pap smear, History of frequent urinary tract infections, History of urinary tract infection, Hypercholesteremia, Hypertension, Menopause, Nocturia, OP (osteoporosis), Pessary maintenance, PONV (postoperative nausea and vomiting), Retinal vessel occlusion, Status post removal of part of parathyroid ([place]) (05/02/2017), Urethral caruncle, Urinary incontinence, Urinary urgency, and Vaginal atrophy. She does not have any pertinent problems on file. She  has a past surgical history that includes Hernia repair; Tonsillectomy; Thyroidectomy; Total hip arthroplasty; Cataract extraction; Thyroglossal duct cyst; Tonsillectomy; Total hip arthroplasty (Right, 08/26/2014); Eye surgery; Eye surgery; and Total knee arthroplasty (Left, 10/25/2015). Her family history includes Alzheimer's disease in her mother; Breast cancer in her paternal aunt; Cancer in her father; Diabetes in her son; Hypertension in her father; Stroke in her paternal grandfather; Tuberculosis in her paternal grandmother. She  reports that she has never smoked. She has never used smokeless tobacco. She reports that she does not drink alcohol and does not use drugs. She has a current medication list which includes the following prescription(s): ascorbic acid, aspirin ec, biotin,  calcium-magnesium-zinc, cholecalciferol, ibuprofen, lutein, trimo-san, synthroid, trimo-san, valsartan, and vitamin a. She has No Known Allergies.       Review of Systems:  Review of Systems  Constitutional: Denied constitutional symptoms, night sweats, recent illness, fatigue, fever, insomnia and weight loss.  Eyes: Denied eye symptoms, eye pain, photophobia, vision change and visual disturbance.  Ears/Nose/Throat/Neck: Denied ear, nose, throat or neck symptoms, hearing loss, nasal discharge, sinus congestion and sore throat.  Cardiovascular: Denied cardiovascular symptoms, arrhythmia, chest pain/pressure, edema, exercise intolerance, orthopnea and palpitations.  Respiratory: Denied pulmonary symptoms, asthma, pleuritic pain, productive sputum, cough, dyspnea and wheezing.  Gastrointestinal: Denied, gastro-esophageal reflux, melena, nausea and vomiting.  Genitourinary: See HPI for additional information.  Musculoskeletal: Denied musculoskeletal symptoms, stiffness, swelling, muscle weakness and myalgia.  Dermatologic: Denied dermatology symptoms, rash and scar.  Neurologic: Denied neurology symptoms, dizziness, headache, neck pain and syncope.  Psychiatric: Denied psychiatric symptoms, anxiety and depression.  Endocrine: Denied endocrine symptoms including hot flashes and night sweats.   Meds:   Current Outpatient Medications on File Prior to Visit  Medication Sig Dispense Refill   ascorbic acid (VITAMIN C) 1000 MG tablet Take 1 tablet by mouth daily.     aspirin EC 81 MG tablet Take by mouth.     Biotin 10 MG CAPS Take by mouth.     CALCIUM-MAGNESIUM-ZINC PO Take by mouth.     cholecalciferol (VITAMIN D) 1000 units tablet Take 1,000 Units by mouth daily.     ibuprofen (ADVIL,MOTRIN) 200 MG tablet Take by mouth.     Lutein 20 MG CAPS Take by mouth.     Oxyquinoline-Sod Lauryl Sulf (TRIMO-SAN) 0.025-0.01 % GEL Place in vagina daily. 113.4 g 0   SYNTHROID 50  MCG tablet Take 1 tablet  once daily 90 tablet 3   TRIMO-SAN 0.025-0.01 % GEL Place in vagina daily. 113.4 g 0   valsartan (DIOVAN) 80 MG tablet Take 1 tablet (80 mg total) by mouth daily. For blood pressure 90 tablet 3   vitamin A 8000 UNIT capsule Take by mouth.     No current facility-administered medications on file prior to visit.      Objective:     Vitals:   08/30/21 1047  BP: (!) 160/87  Pulse: 70   Filed Weights   08/30/21 1047  Weight: 145 lb 11.2 oz (66.1 kg)              Pessary Care Pessary removed and cleaned.  Vagina checked by speculum exam- without erosions - pessary replaced.           Assessment:    G2P2002 Patient Active Problem List   Diagnosis Date Noted   Postoperative hypothyroidism 12/06/2018   S/P parathyroidectomy ([place]) 12/06/2018   Vaginal odor 12/12/2017   Mixed hyperlipidemia 12/04/2017   Incomplete uterovaginal prolapse 05/03/2016   Vitamin D deficiency 03/31/2016   OA (osteoarthritis) of knee 10/25/2015   Nocturia 06/02/2015   Cystocele, midline 06/02/2015   Breast cancer screening 03/10/2015   OA (osteoarthritis) of hip 08/26/2014   Hypercalcemia 07/06/2014   Vaginal erosion secondary to pessary use ([place]) 07/01/2014   Hypothyroid 04/10/2013   Essential hypertension 04/10/2013   Arthritis 04/10/2013   Osteoporosis 04/10/2013     1. Pessary maintenance   2. Urge incontinence of urine        Plan:            1.  After discussion patient would like to try Ditropan for urine loss.  She will give it a 17-monthtrial and see if it works for her.  2.  Follow-up for pessary care in 5 months. Orders No orders of the defined types were placed in this encounter.   No orders of the defined types were placed in this encounter.     F/U  Return in about 5 months (around 01/30/2022). I spent 21 minutes involved in the care of this patient preparing to see the patient by obtaining and reviewing her medical history (including labs, imaging tests and prior  procedures), documenting clinical information in the electronic health record (EHR), counseling and coordinating care plans, writing and sending prescriptions, ordering tests or procedures and in direct communicating with the patient and medical staff discussing pertinent items from her history and physical exam.  DFinis Bud M.D. 08/30/2021 11:16 AM

## 2021-09-02 ENCOUNTER — Telehealth: Payer: Self-pay | Admitting: *Deleted

## 2021-09-02 NOTE — Patient Outreach (Signed)
  Care Coordination   09/02/2021 Name: HASEL JANISH MRN: [id number] DOB: 1935-11-30   Care Coordination Outreach Attempts:  An unsuccessful telephone outreach was attempted today to offer the patient information about available care coordination services as a benefit of their health plan.   Follow Up Plan:  Additional outreach attempts will be made to offer the patient care coordination information and services.   Encounter Outcome:  No Answer  Care Coordination Interventions Activated:  No   Care Coordination Interventions:  No, not indicated    Masie Bermingham, [place] Worker  [facility] Care Management [phone]

## 2021-10-11 ENCOUNTER — Encounter: Payer: Self-pay | Admitting: Nurse Practitioner

## 2021-10-11 ENCOUNTER — Ambulatory Visit: Payer: Medicare PPO | Admitting: Nurse Practitioner

## 2021-10-11 ENCOUNTER — Other Ambulatory Visit: Payer: Self-pay

## 2021-10-11 VITALS — BP 112/78 | HR 96 | Temp 98.0°F | Resp 14 | Ht 64.0 in | Wt 142.2 lb

## 2021-10-11 DIAGNOSIS — I10 Essential (primary) hypertension: Secondary | ICD-10-CM

## 2021-10-11 DIAGNOSIS — E890 Postprocedural hypothyroidism: Secondary | ICD-10-CM

## 2021-10-11 NOTE — Assessment & Plan Note (Addendum)
Forms filled out for DMV.  Patient is taking synthroid 50 mcg daily.  Doing well, no symptoms.
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
## 2021-10-11 NOTE — Assessment & Plan Note (Signed)
Forms filled out for DMV.  Patient is taking valsartan 80 mg daily.  Blood pressure at goal.

## 2021-10-19 ENCOUNTER — Other Ambulatory Visit: Payer: Self-pay | Admitting: Nurse Practitioner

## 2021-10-19 DIAGNOSIS — I10 Essential (primary) hypertension: Secondary | ICD-10-CM

## 2021-10-19 NOTE — Telephone Encounter (Signed)
Requested Prescriptions  Pending Prescriptions Disp Refills  . valsartan (DIOVAN) 80 MG tablet [Pharmacy Med Name: VALSARTAN 80 MG TABLET] 90 tablet 0    Sig: Take 1 tablet (80 mg total) by mouth daily. For blood pressure     Cardiovascular:  Angiotensin Receptor Blockers Failed - 10/19/2021 11:31 AM      Failed - Cr in normal range and within 180 days    Creat  Date Value Ref Range Status  03/25/2021 0.65 0.60 - 0.95 mg/dL Final         Failed - K in normal range and within 180 days    Potassium  Date Value Ref Range Status  03/25/2021 4.5 3.5 - 5.3 mmol/L Final  02/15/2011 3.6 3.5 - 5.1 mmol/L Final         Passed - Patient is not pregnant      Passed - Last BP in normal range    BP Readings from Last 1 Encounters:  10/11/21 112/78         Passed - Valid encounter within last 6 months    Recent Outpatient Visits          1 week ago Essential hypertension   [facility] Bo Merino, FNP   6 months ago Annual physical exam   [facility] Delsa Grana, PA-C   10 months ago Postoperative hypothyroidism   [place], FNP   1 year ago Postoperative hypothyroidism   [place], DO   2 years ago Acute bilateral low back pain without sciatica   [facility] Towanda Malkin, MD      Future Appointments            In 5 months Reece Packer, Myna Hidalgo, [facility], [facility]

## 2021-10-27 ENCOUNTER — Other Ambulatory Visit: Payer: Self-pay | Admitting: Obstetrics and Gynecology

## 2021-11-01 ENCOUNTER — Encounter: Payer: Self-pay | Admitting: *Deleted

## 2021-11-01 ENCOUNTER — Telehealth: Payer: Self-pay | Admitting: *Deleted

## 2021-11-01 NOTE — Patient Outreach (Signed)
  Care Coordination   Initial Visit Note   11/01/2021 Name: Haley Schneider MRN: [id number] DOB: May 31, 1935  Haley Schneider is a 86 y.o. year old female who sees Bo Merino, FNP for primary care. I spoke with  [name] by phone today.  What matters to the patients health and wellness today?  Report she is doing well, taking antihypertensive and medication for thyroid.  Otherwise is healthy, still doing her own yardwork.  Does not request follow up at this time.    Goals Addressed             This Visit's Progress    COMPLETED: Care Coordination Activites - No follow up needed       Care Coordination Interventions: Patient interviewed about adult health maintenance status including  Pneumonia Vaccine Regular eye checkups Regular Dental Care    Blood Pressure    Advised patient to discuss  Influenza Vaccine COVID vaccination    with primary care provider  Provided education about Overall health maintenance SDOH assessment completed Reviewed use of blood pressure machine, she is monitoring daily and recording trends, report they are stable Received initial 2 Covid shots, does not wish to have more, nor does she wish to have flu shot         SDOH assessments and interventions completed:  Yes  SDOH Interventions Today    Flowsheet Row Most Recent Value  SDOH Interventions   Food Insecurity Interventions Intervention Not Indicated  Housing Interventions Intervention Not Indicated  Transportation Interventions Intervention Not Indicated  Utilities Interventions Intervention Not Indicated        Care Coordination Interventions Activated:  Yes  Care Coordination Interventions:  Yes, provided   Follow up plan: No further intervention required.   Encounter Outcome:  Pt. Visit Completed   Valente David, RN, MSN, [place] Care Management Care Management Coordinator [phone]

## 2021-11-01 NOTE — Patient Outreach (Signed)
  Care Coordination   11/01/2021 Name: DEVOTA VIRUET MRN: [id number] DOB: 1935-04-03   Care Coordination Outreach Attempts:  A second unsuccessful outreach was attempted today to offer the patient with information about available care coordination services as a benefit of their health plan.     Follow Up Plan:  Additional outreach attempts will be made to offer the patient care coordination information and services.   Encounter Outcome:  No Answer  Care Coordination Interventions Activated:  No   Care Coordination Interventions:  No, not indicated    Valente David, RN, MSN,  [facility], [facility], [facility] Care Management Care Management Coordinator [phone]

## 2021-11-15 DIAGNOSIS — H43813 Vitreous degeneration, bilateral: Secondary | ICD-10-CM | POA: Diagnosis not present

## 2021-11-15 DIAGNOSIS — H34832 Tributary (branch) retinal vein occlusion, left eye, with macular edema: Secondary | ICD-10-CM | POA: Diagnosis not present

## 2021-11-15 DIAGNOSIS — H35373 Puckering of macula, bilateral: Secondary | ICD-10-CM | POA: Diagnosis not present

## 2021-11-21 ENCOUNTER — Other Ambulatory Visit: Payer: Self-pay

## 2021-11-21 DIAGNOSIS — N3941 Urge incontinence: Secondary | ICD-10-CM

## 2021-12-16 ENCOUNTER — Other Ambulatory Visit: Payer: Self-pay | Admitting: Nurse Practitioner

## 2021-12-16 DIAGNOSIS — E890 Postprocedural hypothyroidism: Secondary | ICD-10-CM

## 2021-12-16 NOTE — Telephone Encounter (Signed)
Requested Prescriptions  Pending Prescriptions Disp Refills   SYNTHROID 50 MCG tablet [Pharmacy Med Name: SYNTHROID 50 MCG TABLET] 90 tablet 0    Sig: Take 1 tablet once daily     Endocrinology:  Hypothyroid Agents Passed - 12/16/2021  1:30 PM      Passed - TSH in normal range and within 360 days    TSH  Date Value Ref Range Status  03/25/2021 0.84 0.40 - 4.50 mIU/L Final         Passed - Valid encounter within last 12 months    Recent Outpatient Visits           2 months ago Essential hypertension   [facility] Bo Merino, FNP   8 months ago Annual physical exam   [facility] Delsa Grana, PA-C   1 year ago Postoperative hypothyroidism   [facility] Bo Merino, FNP   1 year ago Postoperative hypothyroidism   [place], DO   2 years ago Acute bilateral low back pain without sciatica   [facility] Towanda Malkin, MD       Future Appointments             In 3 months Reece Packer, Myna Hidalgo, [facility], [facility]

## 2022-01-31 ENCOUNTER — Ambulatory Visit: Payer: Medicare PPO | Admitting: Obstetrics and Gynecology

## 2022-01-31 ENCOUNTER — Encounter: Payer: Self-pay | Admitting: Obstetrics and Gynecology

## 2022-01-31 VITALS — BP 152/89 | HR 91 | Ht 64.0 in | Wt 139.5 lb

## 2022-01-31 DIAGNOSIS — R300 Dysuria: Secondary | ICD-10-CM | POA: Diagnosis not present

## 2022-01-31 DIAGNOSIS — R399 Unspecified symptoms and signs involving the genitourinary system: Secondary | ICD-10-CM

## 2022-01-31 DIAGNOSIS — Z4689 Encounter for fitting and adjustment of other specified devices: Secondary | ICD-10-CM

## 2022-01-31 DIAGNOSIS — R82998 Other abnormal findings in urine: Secondary | ICD-10-CM

## 2022-01-31 DIAGNOSIS — R102 Pelvic and perineal pain: Secondary | ICD-10-CM

## 2022-01-31 LAB — POCT URINALYSIS DIPSTICK
Bilirubin, UA: NEGATIVE
Glucose, UA: NEGATIVE
Ketones, UA: NEGATIVE
Nitrite, UA: NEGATIVE
Protein, UA: NEGATIVE
Spec Grav, UA: 1.01 (ref 1.010–1.025)
Urobilinogen, UA: 0.2 E.U./dL
pH, UA: 6 (ref 5.0–8.0)

## 2022-01-31 MED ORDER — NITROFURANTOIN MONOHYD MACRO 100 MG PO CAPS: 100.0000 mg | ORAL_CAPSULE | Freq: Two times a day (BID) | ORAL | 0 refills | Status: DC

## 2022-01-31 MED ORDER — TRIMO-SAN 0.025-0.01 % VA GEL: VAGINAL | 0 refills | Status: DC

## 2022-01-31 NOTE — Progress Notes (Signed)
HPI:      Haley Schneider is a 87 y.o. G2P2002 who LMP was No LMP recorded. Patient is postmenopausal.  Subjective:   She presents today stating that her urinary incontinence has become worse.  She is here for pessary maintenance.  She is not using Ditropan because she says it did not work.    Hx: The following portions of the patient's history were reviewed and updated as appropriate:             She  has a past medical history of Arthritis, Fibroid, uterine, GERD (gastroesophageal reflux disease), History of abnormal cervical Pap smear, History of frequent urinary tract infections, History of urinary tract infection, Hypercholesteremia, Hypertension, Menopause, Nocturia, OP (osteoporosis), Pessary maintenance, PONV (postoperative nausea and vomiting), Retinal vessel occlusion, Status post removal of part of parathyroid ([place]) (05/02/2017), Urethral caruncle, Urinary incontinence, Urinary urgency, and Vaginal atrophy. She does not have any pertinent problems on file. She  has a past surgical history that includes Hernia repair; Tonsillectomy; Thyroidectomy; Total hip arthroplasty; Cataract extraction; Thyroglossal duct cyst; Tonsillectomy; Total hip arthroplasty (Right, 08/26/2014); Eye surgery; Eye surgery; and Total knee arthroplasty (Left, 10/25/2015). Her family history includes Alzheimer's disease in her mother; Breast cancer in her paternal aunt; Cancer in her father; Diabetes in her son; Hypertension in her father; Stroke in her paternal grandfather; Tuberculosis in her paternal grandmother. She  reports that she has never smoked. She has never used smokeless tobacco. She reports that she does not drink alcohol and does not use drugs. She has a current medication list which includes the following prescription(s): ascorbic acid, aspirin ec, biotin, calcium-magnesium-zinc, cholecalciferol, ibuprofen, lutein, nitrofurantoin (macrocrystal-monohydrate), trimo-san, synthroid, trimo-san, valsartan,  vitamin a, and trimo-san. She has No Known Allergies.       Review of Systems:  Review of Systems  Constitutional: Denied constitutional symptoms, night sweats, recent illness, fatigue, fever, insomnia and weight loss.  Eyes: Denied eye symptoms, eye pain, photophobia, vision change and visual disturbance.  Ears/Nose/Throat/Neck: Denied ear, nose, throat or neck symptoms, hearing loss, nasal discharge, sinus congestion and sore throat.  Cardiovascular: Denied cardiovascular symptoms, arrhythmia, chest pain/pressure, edema, exercise intolerance, orthopnea and palpitations.  Respiratory: Denied pulmonary symptoms, asthma, pleuritic pain, productive sputum, cough, dyspnea and wheezing.  Gastrointestinal: Denied, gastro-esophageal reflux, melena, nausea and vomiting.  Genitourinary: See HPI for additional information.  Musculoskeletal: Denied musculoskeletal symptoms, stiffness, swelling, muscle weakness and myalgia.  Dermatologic: Denied dermatology symptoms, rash and scar.  Neurologic: Denied neurology symptoms, dizziness, headache, neck pain and syncope.  Psychiatric: Denied psychiatric symptoms, anxiety and depression.  Endocrine: Denied endocrine symptoms including hot flashes and night sweats.   Meds:   Current Outpatient Medications on File Prior to Visit  Medication Sig Dispense Refill   ascorbic acid (VITAMIN C) 1000 MG tablet Take 1 tablet by mouth daily.     aspirin EC 81 MG tablet Take by mouth.     Biotin 10 MG CAPS Take by mouth.     CALCIUM-MAGNESIUM-ZINC PO Take by mouth.     cholecalciferol (VITAMIN D) 1000 units tablet Take 1,000 Units by mouth daily.     ibuprofen (ADVIL,MOTRIN) 200 MG tablet Take by mouth.     Lutein 20 MG CAPS Take by mouth.     Oxyquinoline-Sod Lauryl Sulf (TRIMO-SAN) 0.025-0.01 % GEL Place in vagina daily. 113.4 g 0   SYNTHROID 50 MCG tablet Take 1 tablet once daily 90 tablet 0   TRIMO-SAN 0.025-0.01 % GEL Place in vagina daily. 113.4 g  0    valsartan (DIOVAN) 80 MG tablet Take 1 tablet (80 mg total) by mouth daily. For blood pressure 90 tablet 1   vitamin A 8000 UNIT capsule Take by mouth.     No current facility-administered medications on file prior to visit.      Objective:     Vitals:   01/31/22 1038 01/31/22 1100  BP: (!) 171/103 (!) 152/89  Pulse: 83 91   Filed Weights   01/31/22 1038  Weight: 139 lb 8 oz (63.3 kg)              Pessary Care Pessary removed and cleaned.  Vagina checked by speculum exam- without erosions - pessary replaced.           Assessment:    G2P2002 Patient Active Problem List   Diagnosis Date Noted   Postoperative hypothyroidism 12/06/2018   S/P parathyroidectomy ([place]) 12/06/2018   Vaginal odor 12/12/2017   Mixed hyperlipidemia 12/04/2017   Incomplete uterovaginal prolapse 05/03/2016   Vitamin D deficiency 03/31/2016   OA (osteoarthritis) of knee 10/25/2015   Nocturia 06/02/2015   Cystocele, midline 06/02/2015   Breast cancer screening 03/10/2015   OA (osteoarthritis) of hip 08/26/2014   Hypercalcemia 07/06/2014   Vaginal erosion secondary to pessary use ([place]) 07/01/2014   Hypothyroid 04/10/2013   Essential hypertension 04/10/2013   Arthritis 04/10/2013   Osteoporosis 04/10/2013     1. Pessary maintenance   2. Pelvic pressure in female   3. Dysuria   4. UTI symptoms   5. Leukocytes in urine     Patient with UTI which is likely increasing her urine loss.   Plan:            1.  Follow-up for pessary care in 6 months  2.  Treat UTI with Macrobid. Orders Orders Placed This Encounter  Procedures   Urine Culture   POCT urinalysis dipstick     Meds ordered this encounter  Medications   Oxyquinoline-Sod Lauryl Sulf (TRIMO-SAN) 0.025-0.01 % GEL    Sig: Place in vagina daily.    Dispense:  113.4 g    Refill:  0   nitrofurantoin, macrocrystal-monohydrate, (MACROBID) 100 MG capsule    Sig: Take 1 capsule (100 mg total) by mouth 2 (two) times daily.     Dispense:  28 capsule    Refill:  0      F/U  Return in about 6 months (around 08/01/2022). I spent 21 minutes involved in the care of this patient preparing to see the patient by obtaining and reviewing her medical history (including labs, imaging tests and prior procedures), documenting clinical information in the electronic health record (EHR), counseling and coordinating care plans, writing and sending prescriptions, ordering tests or procedures and in direct communicating with the patient and medical staff discussing pertinent items from her history and physical exam.  Finis Bud, M.D. 01/31/2022 11:29 AM

## 2022-01-31 NOTE — Progress Notes (Signed)
Patient presents for pessary maintenance. She states continuing to have trouble with urinary leaking all day, stopped taking Ditropan because it was not helping. She reports pressure with urination, UA preformed.  Patient states no other questions or concerns at this time.

## 2022-02-03 LAB — URINE CULTURE

## 2022-02-09 ENCOUNTER — Ambulatory Visit: Payer: Self-pay

## 2022-02-09 NOTE — Telephone Encounter (Signed)
Message from [employer] sent at 02/09/2022  3:17 PM EST  Summary: rx concern   The patient shares that they have been in close contact with someone that has tested positive for COVID 19  The patient is currently experiencing no symptoms and has not tested positive but would like to discuss the potential of taking medication preemptively  Please contact further when possible         Chief Complaint: exposure to covid Tuesday  Symptoms: denies Frequency: n/a Pertinent Negatives: Patient denies fever, cough, breathing difficulty, loss of taste or smell Disposition: '[]'$ ED /'[]'$ Urgent Care (no appt availability in office) / '[]'$ Appointment(In office/virtual)/ '[]'$  [facility]/ '[x]'$ Home Care/ '[]'$ Refused Recommended Disposition /'[]'$ [place] [facility]/ '[]'$  Follow-up with PCP Additional Notes: n/a  Reason for Disposition  [1] Does not meet COVID-19 EXPOSURE criteria BUT [2] caller still concerned about COVID-19 EXPOSURE (e.g., living with someone who was exposed and who has no symptoms of COVID-19)  Answer Assessment - Initial Assessment Questions 1. COVID-19 EXPOSURE: "Please describe how you were exposed to someone with a COVID-19 infection."     Tuesday foster daughter rode with her to [place] in car -  2. PLACE of CONTACT: "Where were you when you were exposed to COVID-19?" (e.g., home, school, medical waiting room; which city?)     car 3. TYPE of CONTACT: "How much contact was there?" (e.g., sitting next to, live in same house, work in same office, same building)     30 minutes in car  4. DURATION of CONTACT: "How long were you in contact with the COVID-19 patient?" (e.g., a few seconds, passed by person, a few minutes, 15 minutes or longer, live with the patient)     15 minutes or longer 5. MASK: "Were you wearing a mask?" "Was the other person wearing a mask?" Note: wearing a mask reduces the risk of an otherwise close contact.     Not in car 6. DATE of CONTACT: "When did you  have contact with a COVID-19 patient?" (e.g., how many days ago)     Tuesday  7. COMMUNITY SPREAD: "Do you live in or have you traveled to an area where there are lots of COVID-19 cases (community spread)?" (See public health department website, if unsure)       yes 8. SYMPTOMS: "Do you have any symptoms?" (e.g., fever, cough, breathing difficulty, loss of taste or smell)     no 9. VACCINE: "Have you gotten the COVID-19 vaccine?" If Yes, ask: "Which one, how many shots, when did you get it?"     *No Answer* 10. PREGNANCY OR POSTPARTUM: "Is there any chance you are pregnant?" "When was your last menstrual period?" "Did you deliver in the last 2 weeks?"       *No Answer* 11. HIGH RISK: "Do you have any heart or lung problems?" (e.g., asthma, COPD, heart failure) "Do you have a weak immune system or other risk factors?" (e.g., HIV positive, chemotherapy, renal failure, diabetes mellitus, sickle cell anemia, obesity)       *No Answer*  Protocols used: Coronavirus (COVID-19) Exposure-A-AH

## 2022-02-15 DIAGNOSIS — H34832 Tributary (branch) retinal vein occlusion, left eye, with macular edema: Secondary | ICD-10-CM | POA: Diagnosis not present

## 2022-02-15 DIAGNOSIS — H35373 Puckering of macula, bilateral: Secondary | ICD-10-CM | POA: Diagnosis not present

## 2022-02-15 DIAGNOSIS — H43393 Other vitreous opacities, bilateral: Secondary | ICD-10-CM | POA: Diagnosis not present

## 2022-02-15 DIAGNOSIS — H43813 Vitreous degeneration, bilateral: Secondary | ICD-10-CM | POA: Diagnosis not present

## 2022-02-27 ENCOUNTER — Other Ambulatory Visit: Payer: Self-pay | Admitting: Obstetrics and Gynecology

## 2022-02-27 DIAGNOSIS — Z4689 Encounter for fitting and adjustment of other specified devices: Secondary | ICD-10-CM

## 2022-03-13 ENCOUNTER — Other Ambulatory Visit: Payer: Self-pay | Admitting: Nurse Practitioner

## 2022-03-13 DIAGNOSIS — E890 Postprocedural hypothyroidism: Secondary | ICD-10-CM

## 2022-03-14 NOTE — Telephone Encounter (Signed)
Requested Prescriptions  Pending Prescriptions Disp Refills   SYNTHROID 50 MCG tablet [Pharmacy Med Name: SYNTHROID 50 MCG TABLET] 90 tablet 0    Sig: Take 1 tablet once daily     Endocrinology:  Hypothyroid Agents Passed - 03/13/2022  7:22 AM      Passed - TSH in normal range and within 360 days    TSH  Date Value Ref Range Status  03/25/2021 0.84 0.40 - 4.50 mIU/L Final         Passed - Valid encounter within last 12 months    Recent Outpatient Visits           5 months ago Essential hypertension   [place], FNP   11 months ago Annual physical exam   [facility] Delsa Grana, PA-C   1 year ago Postoperative hypothyroidism   [facility] Bo Merino, FNP   1 year ago Postoperative hypothyroidism   [facility] Myles Gip, DO   2 years ago Acute bilateral low back pain without sciatica   [facility] Towanda Malkin, MD       Future Appointments             In 2 weeks Reece Packer, [facility] H[place], [facility], [facility]

## 2022-03-31 NOTE — Progress Notes (Signed)
Name: Haley Schneider   MRN: [id number]    DOB: Apr 08, 1935   Date:04/03/2022       Progress Note  Subjective  Chief Complaint  Chief Complaint  Patient presents with   Annual Exam    HPI  Patient presents for annual CPE and acute illness .aware of possible additional charge  Dysuria: she was treated for a uti back in January.  She reports she started having some dysuria over the last week.  She would like to have her urine checked. She denies any fever or back pain. Will get urine dip.  Urine dip shows positive leukocytes, will send urine for culture and start patient on macrobid . She was on this antibiotic back in January and she did well with it.   Diet: well balanced diet, and takes vitamins Exercise: works out in the yard, walks  Sleep: wakes up 2 times a night to go to the bathroom, she says she gets about 5-6 hours Last dental exam:last year Last eye exam: sees retinal specialist  [place] Office Visit from 04/03/2022 in [facility]  AUDIT-C Score 0      Depression: Phq 9 is  negative    04/03/2022   10:04 AM 10/11/2021    2:32 PM 03/25/2021   10:14 AM 12/09/2020   10:35 AM 03/22/2020   10:09 AM  Depression screen PHQ 2/9  Decreased Interest 0 0 0 0 0  Down, Depressed, Hopeless 0 0 0 0 0  PHQ - 2 Score 0 0 0 0 0  Altered sleeping   0    Tired, decreased energy   0    Change in appetite   0    Feeling bad or failure about yourself    0    Trouble concentrating   0    Moving slowly or fidgety/restless   0    Suicidal thoughts   0    PHQ-9 Score   0    Difficult doing work/chores   Not difficult at all     Hypertension: BP Readings from Last 3 Encounters:  04/03/22 132/72  01/31/22 (!) 152/89  10/11/21 112/78   Obesity: Wt Readings from Last 3 Encounters:  04/03/22 137 lb 11.2 oz (62.5 kg)  01/31/22 139 lb 8 oz (63.3 kg)  10/11/21 142 lb 3.2 oz (64.5 kg)   BMI Readings from Last 3 Encounters:  04/03/22 23.64 kg/m  01/31/22  23.95 kg/m  10/11/21 24.41 kg/m     Vaccines:  HPV: up to at age 93 , ask insurance if age between 61-45  Shingrix: 52-64 yo and ask insurance if covered when patient above 16 yo Pneumonia:  educated and discussed with patient. Flu:  educated and discussed with patient.  Hep C Screening: none, declined STD testing and prevention (HIV/chl/gon/syphilis): none, declined Intimate partner violence:none Sexual History :none Menstrual History/LMP/Abnormal Bleeding: postmenopausal  Incontinence Symptoms: uses briefs at night  Breast cancer:  - Last Mammogram: 02/05/2019, aged out - BRCA gene screening: none  Osteoporosis: Discussed high calcium and vitamin D supplementation, weight bearing exercises, she does not want to have a dexa scan done anymore  Cervical cancer screening: aged out  Skin cancer: Discussed monitoring for atypical lesions  Colorectal cancer: aged out   Lung cancer:   Low Dose CT Chest recommended if Age 86-80 years, 20 pack-year currently smoking OR have quit w/in 15years. Patient does not qualify.   ECG: 10/18/2015  Advanced Care Planning: A voluntary discussion about advance  care planning including the explanation and discussion of advance directives.  Discussed health care proxy and Living will, and the patient was able to identify a health care proxy as two children.  Patient does have a living will at present time. If patient does have living will, I have requested they bring this to the clinic to be scanned in to their chart.  Lipids: Lab Results  Component Value Date   CHOL 203 (H) 03/25/2021   CHOL 205 (H) 12/04/2017   CHOL 206 (H) 05/30/2017   Lab Results  Component Value Date   HDL 57 03/25/2021   HDL 66 12/04/2017   HDL 63 05/30/2017   Lab Results  Component Value Date   LDLCALC 124 (H) 03/25/2021   LDLCALC 117 (H) 12/04/2017   LDLCALC 118 (H) 05/30/2017   Lab Results  Component Value Date   TRIG 117 03/25/2021   TRIG 110 12/04/2017   TRIG  142 05/30/2017   Lab Results  Component Value Date   CHOLHDL 3.6 03/25/2021   CHOLHDL 3.1 12/04/2017   CHOLHDL 3.3 05/30/2017   No results found for: "LDLDIRECT"  Glucose: Glucose  Date Value Ref Range Status  02/15/2011 80 65 - 99 mg/dL Final   Glucose, Bld  Date Value Ref Range Status  03/25/2021 84 65 - 99 mg/dL Final    Comment:    .            Fasting reference interval .   03/22/2020 86 65 - 99 mg/dL Final    Comment:    .            Fasting reference interval .   12/06/2018 83 65 - 99 mg/dL Final    Comment:    .            Fasting reference interval .     Patient Active Problem List   Diagnosis Date Noted   Postoperative hypothyroidism 12/06/2018   S/P parathyroidectomy 12/06/2018   Vaginal odor 12/12/2017   Mixed hyperlipidemia 12/04/2017   Incomplete uterovaginal prolapse 05/03/2016   Vitamin D deficiency 03/31/2016   OA (osteoarthritis) of knee 10/25/2015   Nocturia 06/02/2015   Cystocele, midline 06/02/2015   Breast cancer screening 03/10/2015   OA (osteoarthritis) of hip 08/26/2014   Hypercalcemia 07/06/2014   Vaginal erosion secondary to pessary use 07/01/2014   Hypothyroid 04/10/2013   Essential hypertension 04/10/2013   Arthritis 04/10/2013   Osteoporosis 04/10/2013    Past Surgical History:  Procedure Laterality Date   CATARACT EXTRACTION     EYE SURGERY     cataract surgery bilat    EYE SURGERY     laser eye surgery left eye"retinal acclution"laser   HERNIA REPAIR     THYROGLOSSAL DUCT CYST     THYROIDECTOMY     TONSILLECTOMY     TONSILLECTOMY     TOTAL HIP ARTHROPLASTY     left hip 7 years ago    TOTAL HIP ARTHROPLASTY Right 08/26/2014   Procedure: RIGHT TOTAL HIP ARTHROPLASTY ANTERIOR APPROACH;  Surgeon: Gaynelle Arabian, MD;  Location: [facility];  Service: Orthopedics;  Laterality: Right;   TOTAL KNEE ARTHROPLASTY Left 10/25/2015   Procedure: LEFT TOTAL KNEE ARTHROPLASTY;  Surgeon: Gaynelle Arabian, MD;  Location: [facility];  Service:  Orthopedics;  Laterality: Left;    Family History  Problem Relation Age of Onset   Alzheimer's disease Mother    Cancer Father        lung   Hypertension Father  Stroke Paternal Grandfather    Tuberculosis Paternal Grandmother    Diabetes Son    Breast cancer Paternal Aunt    Heart disease Neg Hx     Social History   Socioeconomic History   Marital status: Widowed    Spouse name: Not on file   Number of children: 2   Years of education: Not on file   Highest education level: Not on file  Occupational History   Occupation: retired  Tobacco Use   Smoking status: Never   Smokeless tobacco: Never  Vaping Use   Vaping Use: Never used  Substance and Sexual Activity   Alcohol use: No   Drug use: No   Sexual activity: Not Currently    Birth control/protection: Surgical  Other Topics Concern   Not on file  Social History Narrative   Not on file   Social Determinants of Health   Financial Resource Strain: Low Risk  (04/03/2022)   Overall Financial Resource Strain (CARDIA)    Difficulty of Paying Living Expenses: Not hard at all  Food Insecurity: No Food Insecurity (04/03/2022)   Hunger Vital Sign    Worried About Running Out of Food in the Last Year: Never true    [place] in the Last Year: Never true  Transportation Needs: No Transportation Needs (04/03/2022)   PRAPARE - Hydrologist (Medical): No    Lack of Transportation (Non-Medical): No  Physical Activity: Sufficiently Active (04/03/2022)   Exercise Vital Sign    Days of Exercise per Week: 5 days    Minutes of Exercise per Session: 40 min  Stress: No Stress Concern Present (04/03/2022)   [place]    Feeling of Stress : Not at all  Social Connections: Moderately Isolated (04/03/2022)   Social Connection and Isolation Panel [NHANES]    Frequency of Communication with Friends and Family: Twice a week    Frequency of  Social Gatherings with Friends and Family: Twice a week    Attends Religious Services: 1 to 4 times per year    Active Member of [employer] or Organizations: No    Attends Archivist Meetings: Never    Marital Status: Widowed  Intimate Partner Violence: Not At Risk (04/03/2022)   Humiliation, Afraid, Rape, and Kick questionnaire    Fear of Current or Ex-Partner: No    Emotionally Abused: No    Physically Abused: No    Sexually Abused: No     Current Outpatient Medications:    ascorbic acid (VITAMIN C) 1000 MG tablet, Take 1 tablet by mouth daily., Disp: , Rfl:    aspirin EC 81 MG tablet, Take by mouth., Disp: , Rfl:    Biotin 10 MG CAPS, Take by mouth., Disp: , Rfl:    CALCIUM-MAGNESIUM-ZINC PO, Take by mouth., Disp: , Rfl:    cholecalciferol (VITAMIN D) 1000 units tablet, Take 1,000 Units by mouth daily., Disp: , Rfl:    ibuprofen (ADVIL,MOTRIN) 200 MG tablet, Take by mouth., Disp: , Rfl:    Lutein 20 MG CAPS, Take by mouth., Disp: , Rfl:    Oxyquinoline-Sod Lauryl Sulf (TRIMO-SAN) 0.025-0.01 % GEL, Place in vagina daily., Disp: 113.4 g, Rfl: 0   SYNTHROID 50 MCG tablet, Take 1 tablet once daily, Disp: 90 tablet, Rfl: 0   TRIMO-SAN 0.025-0.01 % GEL, Place in vagina daily., Disp: 113.4 g, Rfl: 0   valsartan (DIOVAN) 80 MG tablet, Take 1 tablet (80  mg total) by mouth daily. For blood pressure, Disp: 90 tablet, Rfl: 1   vitamin A 8000 UNIT capsule, Take by mouth., Disp: , Rfl:    nitrofurantoin, macrocrystal-monohydrate, (MACROBID) 100 MG capsule, Take 1 capsule (100 mg total) by mouth 2 (two) times daily., Disp: 28 capsule, Rfl: 0   TRIMO-SAN 0.025-0.01 % GEL, Place in vagina daily. (Patient not taking: Reported on 04/03/2022), Disp: 113.4 g, Rfl: 0  No Known Allergies   ROS  Constitutional: Negative for fever or weight change.  Respiratory: Negative for cough and shortness of breath.   Cardiovascular: Negative for chest pain or palpitations.  Gastrointestinal: Negative for  abdominal pain, no bowel changes.  Musculoskeletal: Negative for gait problem or joint swelling.  Skin: Negative for rash.  Neurological: Negative for dizziness or headache.  No other specific complaints in a complete review of systems (except as listed in HPI above).   Objective  Vitals:   04/03/22 1002  BP: 132/72  Pulse: 94  Resp: 16  Temp: 97.9 F (36.6 C)  TempSrc: Oral  SpO2: 97%  Weight: 137 lb 11.2 oz (62.5 kg)  Height: 5\' 4"  (1.626 m)    Body mass index is 23.64 kg/m.  Physical Exam Constitutional: Patient appears well-developed and well-nourished. No distress.  HENT: Head: Normocephalic and atraumatic. Ears: B TMs ok, no erythema or effusion; Nose: Nose normal. Mouth/Throat: Oropharynx is clear and moist. No oropharyngeal exudate.  Eyes: Conjunctivae and EOM are normal. Pupils are equal, round, and reactive to light. No scleral icterus.  Neck: Normal range of motion. Neck supple. No JVD present. No thyromegaly present.  Cardiovascular: Normal rate, regular rhythm and normal heart sounds.  No murmur heard. No BLE edema. Pulmonary/Chest: Effort normal and breath sounds normal. No respiratory distress. Abdominal: Soft. Bowel sounds are normal, no distension. There is no tenderness. no masses, No CVA tenderness Musculoskeletal: Normal range of motion, no joint effusions. No gross deformities Neurological: he is alert and oriented to person, place, and time. No cranial nerve deficit. Coordination, balance, strength, speech and gait are normal.  Skin: Skin is warm and dry. No rash noted. No erythema.  Psychiatric: Patient has a normal mood and affect. behavior is normal. Judgment and thought content normal.    Fall Risk:    04/03/2022   10:03 AM 10/11/2021    2:32 PM 03/25/2021   10:14 AM 12/09/2020   10:35 AM 09/01/2020   10:49 AM  Fall Risk   Falls in the past year? 0 0 0 0 0  Number falls in past yr: 0 0 0 0 0  Injury with Fall? 0 0 0 0 0  Risk for fall due to :    No Fall Risks    Follow up  Falls evaluation completed Falls prevention discussed Falls evaluation completed      Functional Status Survey: Is the patient deaf or have difficulty hearing?: No Does the patient have difficulty seeing, even when wearing glasses/contacts?: No Does the patient have difficulty concentrating, remembering, or making decisions?: No Does the patient have difficulty walking or climbing stairs?: No Does the patient have difficulty dressing or bathing?: No Does the patient have difficulty doing errands alone such as visiting a doctor's office or shopping?: No   Assessment & Plan  1. Annual physical exam  - CBC with Differential/Platelet - COMPLETE METABOLIC PANEL WITH GFR - Lipid panel - Hemoglobin A1c - TSH - VITAMIN D 25 Hydroxy (Vit-D Deficiency, Fractures) - Vitamin B12  2. Essential hypertension  -  CBC with Differential/Platelet - COMPLETE METABOLIC PANEL WITH GFR  3. Postoperative hypothyroidism  - TSH  4. Vitamin D deficiency  - VITAMIN D 25 Hydroxy (Vit-D Deficiency, Fractures)  5. Mixed hyperlipidemia  - COMPLETE METABOLIC PANEL WITH GFR - Lipid panel  6. Deficiency of vitamin B12  - Vitamin B12  7. Screening for diabetes mellitus  - Hemoglobin A1c   8. Dysuria  - POCT urinalysis dipstick - nitrofurantoin, macrocrystal-monohydrate, (MACROBID) 100 MG capsule; Take 1 capsule (100 mg total) by mouth 2 (two) times daily.  Dispense: 28 capsule; Refill: 0 - Urine Culture   -USPSTF grade A and B recommendations reviewed with patient; age-appropriate recommendations, preventive care, screening tests, etc discussed and encouraged; healthy living encouraged; see AVS for patient education given to patient -Discussed importance of 150 minutes of physical activity weekly, eat two servings of fish weekly, eat one serving of tree nuts ( cashews, pistachios, pecans, almonds.Marland Kitchen) every other day, eat 6 servings of fruit/vegetables daily and drink  plenty of water and avoid sweet beverages.

## 2022-04-03 ENCOUNTER — Other Ambulatory Visit: Payer: Self-pay

## 2022-04-03 ENCOUNTER — Encounter: Payer: Self-pay | Admitting: Nurse Practitioner

## 2022-04-03 ENCOUNTER — Ambulatory Visit (INDEPENDENT_AMBULATORY_CARE_PROVIDER_SITE_OTHER): Payer: Medicare PPO | Admitting: Nurse Practitioner

## 2022-04-03 VITALS — BP 132/72 | HR 94 | Temp 97.9°F | Resp 16 | Ht 64.0 in | Wt 137.7 lb

## 2022-04-03 DIAGNOSIS — Z0000 Encounter for general adult medical examination without abnormal findings: Secondary | ICD-10-CM

## 2022-04-03 DIAGNOSIS — E782 Mixed hyperlipidemia: Secondary | ICD-10-CM

## 2022-04-03 DIAGNOSIS — E559 Vitamin D deficiency, unspecified: Secondary | ICD-10-CM | POA: Diagnosis not present

## 2022-04-03 DIAGNOSIS — Z131 Encounter for screening for diabetes mellitus: Secondary | ICD-10-CM | POA: Diagnosis not present

## 2022-04-03 DIAGNOSIS — I10 Essential (primary) hypertension: Secondary | ICD-10-CM

## 2022-04-03 DIAGNOSIS — E890 Postprocedural hypothyroidism: Secondary | ICD-10-CM | POA: Diagnosis not present

## 2022-04-03 DIAGNOSIS — R300 Dysuria: Secondary | ICD-10-CM

## 2022-04-03 DIAGNOSIS — E538 Deficiency of other specified B group vitamins: Secondary | ICD-10-CM | POA: Diagnosis not present

## 2022-04-03 LAB — POCT URINALYSIS DIPSTICK
Bilirubin, UA: NEGATIVE
Blood, UA: NEGATIVE
Glucose, UA: NEGATIVE
Ketones, UA: NEGATIVE
Nitrite, UA: NEGATIVE
Protein, UA: NEGATIVE
Spec Grav, UA: 1.02 (ref 1.010–1.025)
Urobilinogen, UA: NEGATIVE E.U./dL — AB
pH, UA: 5 (ref 5.0–8.0)

## 2022-04-03 MED ORDER — NITROFURANTOIN MONOHYD MACRO 100 MG PO CAPS: 100.0000 mg | ORAL_CAPSULE | Freq: Two times a day (BID) | ORAL | 0 refills | Status: DC

## 2022-04-04 LAB — TSH: TSH: 1.44 mIU/L (ref 0.40–4.50)

## 2022-04-04 LAB — HEMOGLOBIN A1C
Hgb A1c MFr Bld: 5.9 % of total Hgb — ABNORMAL HIGH (ref <5.7)
Mean Plasma Glucose: 123 mg/dL
eAG (mmol/L): 6.8 mmol/L

## 2022-04-04 LAB — CBC WITH DIFFERENTIAL/PLATELET
Absolute Monocytes: 644 cells/uL (ref 200–950)
Basophils Absolute: 30 cells/uL (ref 0–200)
Basophils Relative: 0.4 %
Eosinophils Absolute: 81 cells/uL (ref 15–500)
Eosinophils Relative: 1.1 %
HCT: 39.8 % (ref 35.0–45.0)
Hemoglobin: 13.2 g/dL (ref 11.7–15.5)
Lymphs Abs: 1887 cells/uL (ref 850–3900)
MCH: 26.8 pg — ABNORMAL LOW (ref 27.0–33.0)
MCHC: 33.2 g/dL (ref 32.0–36.0)
MCV: 80.7 fL (ref 80.0–100.0)
MPV: 10.3 fL (ref 7.5–12.5)
Monocytes Relative: 8.7 %
Neutro Abs: 4758 cells/uL (ref 1500–7800)
Neutrophils Relative %: 64.3 %
Platelets: 295 10*3/uL (ref 140–400)
RBC: 4.93 10*6/uL (ref 3.80–5.10)
RDW: 13.5 % (ref 11.0–15.0)
Total Lymphocyte: 25.5 %
WBC: 7.4 10*3/uL (ref 3.8–10.8)

## 2022-04-04 LAB — COMPLETE METABOLIC PANEL WITH GFR
AG Ratio: 1.3 (calc) (ref 1.0–2.5)
ALT: 11 U/L (ref 6–29)
AST: 15 U/L (ref 10–35)
Albumin: 3.9 g/dL (ref 3.6–5.1)
Alkaline phosphatase (APISO): 88 U/L (ref 37–153)
BUN: 12 mg/dL (ref 7–25)
CO2: 24 mmol/L (ref 20–32)
Calcium: 9 mg/dL (ref 8.6–10.4)
Chloride: 98 mmol/L (ref 98–110)
Creat: 0.69 mg/dL (ref 0.60–0.95)
Globulin: 2.9 g/dL (calc) (ref 1.9–3.7)
Glucose, Bld: 80 mg/dL (ref 65–99)
Potassium: 4.2 mmol/L (ref 3.5–5.3)
Sodium: 135 mmol/L (ref 135–146)
Total Bilirubin: 0.4 mg/dL (ref 0.2–1.2)
Total Protein: 6.8 g/dL (ref 6.1–8.1)
eGFR: 85 mL/min/{1.73_m2} (ref >=60)

## 2022-04-04 LAB — URINE CULTURE
MICRO NUMBER:: 14767154
SPECIMEN QUALITY:: ADEQUATE

## 2022-04-04 LAB — LIPID PANEL
Cholesterol: 184 mg/dL (ref <200)
HDL: 57 mg/dL (ref >=50)
LDL Cholesterol (Calc): 104 mg/dL (calc) — ABNORMAL HIGH
Non-HDL Cholesterol (Calc): 127 mg/dL (calc) (ref <130)
Total CHOL/HDL Ratio: 3.2 (calc) (ref <5.0)
Triglycerides: 124 mg/dL (ref <150)

## 2022-04-04 LAB — VITAMIN D 25 HYDROXY (VIT D DEFICIENCY, FRACTURES): Vit D, 25-Hydroxy: 97 ng/mL (ref 30–100)

## 2022-04-04 LAB — VITAMIN B12: Vitamin B-12: 493 pg/mL (ref 200–1100)

## 2022-04-05 ENCOUNTER — Telehealth: Payer: Self-pay | Admitting: Nurse Practitioner

## 2022-04-05 NOTE — Telephone Encounter (Signed)
Please advise 

## 2022-04-05 NOTE — Telephone Encounter (Signed)
Medication Refill - Medication: nitrofurantoin, macrocrystal-monohydrate, (MACROBID   Has the patient contacted their pharmacy? No.   Preferred Pharmacy (with phone number or street name):  [place], [place] Phone: [phone]  Fax: [phone]     Has the patient been seen for an appointment in the last year OR does the patient have an upcoming appointment? Yes.    Patient stated she took a pill on Monday aroung 5pm and have a bad allergic reaction with skin truning red and itchy. Her daughter who is a nurse helper her through it but now patient is looking for an alternate prescription for the UTI she diagnosed with on Monday.

## 2022-04-05 NOTE — Telephone Encounter (Signed)
Patient notified

## 2022-04-13 ENCOUNTER — Other Ambulatory Visit: Payer: Self-pay | Admitting: Obstetrics and Gynecology

## 2022-04-13 ENCOUNTER — Other Ambulatory Visit: Payer: Self-pay | Admitting: Nurse Practitioner

## 2022-04-13 DIAGNOSIS — Z4689 Encounter for fitting and adjustment of other specified devices: Secondary | ICD-10-CM

## 2022-04-13 DIAGNOSIS — I10 Essential (primary) hypertension: Secondary | ICD-10-CM

## 2022-04-13 NOTE — Telephone Encounter (Signed)
Requested Prescriptions  Pending Prescriptions Disp Refills   valsartan (DIOVAN) 80 MG tablet [Pharmacy Med Name: VALSARTAN 80 MG TABLET] 90 tablet 1    Sig: Take 1 tablet (80 mg total) by mouth daily. For blood pressure     Cardiovascular:  Angiotensin Receptor Blockers Passed - 04/13/2022  7:24 AM      Passed - Cr in normal range and within 180 days    Creat  Date Value Ref Range Status  04/03/2022 0.69 0.60 - 0.95 mg/dL Final         Passed - K in normal range and within 180 days    Potassium  Date Value Ref Range Status  04/03/2022 4.2 3.5 - 5.3 mmol/L Final  02/15/2011 3.6 3.5 - 5.1 mmol/L Final         Passed - Patient is not pregnant      Passed - Last BP in normal range    BP Readings from Last 1 Encounters:  04/03/22 132/72         Passed - Valid encounter within last 6 months    Recent Outpatient Visits           1 week ago Annual physical exam   [facility] Berniece Salines, FNP   6 months ago Essential hypertension   [facility] Berniece Salines, FNP   1 year ago Annual physical exam   [facility] Danelle Berry, PA-C   1 year ago Postoperative hypothyroidism   [facility] Berniece Salines, FNP   2 years ago Postoperative hypothyroidism   [facility] Caro Laroche, DO       Future Appointments             In 5 months Zane Herald, Rudolpho Sevin, FNP [facility], [facility]

## 2022-06-03 ENCOUNTER — Other Ambulatory Visit: Payer: Self-pay | Admitting: Nurse Practitioner

## 2022-06-03 DIAGNOSIS — E890 Postprocedural hypothyroidism: Secondary | ICD-10-CM

## 2022-06-05 NOTE — Telephone Encounter (Signed)
Requested Prescriptions  Pending Prescriptions Disp Refills   SYNTHROID 50 MCG tablet [Pharmacy Med Name: SYNTHROID 50 MCG TABLET] 90 tablet 3    Sig: Take 1 tablet once daily     Endocrinology:  Hypothyroid Agents Passed - 06/03/2022 11:22 AM      Passed - TSH in normal range and within 360 days    TSH  Date Value Ref Range Status  04/03/2022 1.44 0.40 - 4.50 mIU/L Final         Passed - Valid encounter within last 12 months    Recent Outpatient Visits           2 months ago Annual physical exam   [facility] Berniece Salines, FNP   7 months ago Essential hypertension   [facility] Berniece Salines, FNP   1 year ago Annual physical exam   [facility] Danelle Berry, PA-C   1 year ago Postoperative hypothyroidism   [facility] Berniece Salines, FNP   2 years ago Postoperative hypothyroidism   [facility] Caro Laroche, DO       Future Appointments             In 4 months Zane Herald, Rudolpho Sevin, FNP [facility], [facility]

## 2022-07-11 ENCOUNTER — Encounter: Payer: Self-pay | Admitting: Obstetrics and Gynecology

## 2022-07-11 ENCOUNTER — Ambulatory Visit: Payer: Medicare PPO | Admitting: Obstetrics and Gynecology

## 2022-07-11 VITALS — BP 153/89 | HR 81 | Ht 64.0 in | Wt 128.2 lb

## 2022-07-11 DIAGNOSIS — Z4689 Encounter for fitting and adjustment of other specified devices: Secondary | ICD-10-CM

## 2022-07-11 NOTE — Progress Notes (Signed)
HPI:      [name] is a 87 y.o. [id number] who LMP was No LMP recorded. Patient is postmenopausal.  Subjective:   She presents today for pessary care.  She has had it in place for 6 months.  She reports random occasional spotting but she says she has always had this problem especially when she is very active.    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 She does not have any pertinent problems on file. She  has a past surgical history that includes Hernia repair; Tonsillectomy; Thyroidectomy; Total hip arthroplasty; Cataract extraction; Thyroglossal duct cyst; Tonsillectomy; Total hip arthroplasty (Right, 08/26/2014); Eye surgery; Eye surgery; and Total knee arthroplasty (Left, 10/25/2015). Her family history includes Alzheimer's disease in her mother; Breast cancer in her paternal aunt; Cancer in her father; Diabetes in her son; Hypertension in her father; Stroke in her paternal grandfather; Tuberculosis in her paternal grandmother. She  reports that she has never smoked. She has never used smokeless tobacco. She reports that she does not drink alcohol and does not use drugs. She has a current medication list which includes the following prescription(s): ascorbic acid, aspirin ec, biotin, calcium-magnesium-zinc, cholecalciferol, ibuprofen, lutein, nitrofurantoin (macrocrystal-monohydrate), trimo-san, synthroid,  trimo-san, trimo-san, trimo-san, valsartan, and vitamin a. She has No Known Allergies.       Review of Systems:  Review of Systems  Constitutional: Denied constitutional symptoms, night sweats, recent illness, fatigue, fever, insomnia and weight loss.  Eyes: Denied eye symptoms, eye pain, photophobia, vision change and visual disturbance.  Ears/Nose/Throat/Neck: Denied ear, nose, throat or neck symptoms, hearing loss, nasal discharge, sinus congestion and sore throat.  Cardiovascular: Denied cardiovascular symptoms, arrhythmia, chest pain/pressure, edema, exercise intolerance, orthopnea and palpitations.  Respiratory: Denied pulmonary symptoms, asthma, pleuritic pain, productive sputum, cough, dyspnea and wheezing.  Gastrointestinal: Denied, gastro-esophageal reflux, melena, nausea and vomiting.  Genitourinary: Denied genitourinary symptoms including symptomatic vaginal discharge, pelvic relaxation issues, and urinary complaints.  Musculoskeletal: Denied musculoskeletal symptoms, stiffness, swelling, muscle weakness and myalgia.  Dermatologic: Denied dermatology symptoms, rash and scar.  Neurologic: Denied neurology symptoms, dizziness, headache, neck pain and syncope.  Psychiatric: Denied psychiatric symptoms, anxiety and depression.  Endocrine: Denied endocrine symptoms including hot flashes and night sweats.   Meds:   Current Outpatient Medications on File Prior to Visit  Medication Sig Dispense Refill   ascorbic acid (VITAMIN C) 1000 MG tablet Take 1 tablet by mouth daily.     aspirin EC 81 MG tablet Take by mouth.     Biotin 10 MG CAPS Take by mouth.     CALCIUM-MAGNESIUM-ZINC PO Take by mouth.     cholecalciferol (VITAMIN D) 1000 units tablet Take 1,000 Units by mouth daily.     ibuprofen (ADVIL,MOTRIN) 200 MG tablet Take by mouth.     Lutein 20 MG CAPS Take by mouth.     nitrofurantoin, macrocrystal-monohydrate, (MACROBID) 100 MG capsule Take 1 capsule (100 mg total) by mouth 2  (two) times daily. 28 capsule 0   Oxyquinoline-Sod Lauryl  Sulf (TRIMO-SAN) 0.025-0.01 % GEL Place in vagina daily. 113.4 g 0   SYNTHROID 50 MCG tablet Take 1 tablet once daily 90 tablet 3   TRIMO-SAN 0.025-0.01 % GEL Place in vagina daily. 113.4 g 0   TRIMO-SAN 0.025-0.01 % GEL Place in vagina daily. (Patient not taking: Reported on 04/03/2022) 113.4 g 0   TRIMO-SAN 0.025-0.01 % GEL Place in vagina daily. 113.4 g 0   valsartan (DIOVAN) 80 MG tablet Take 1 tablet (80 mg total) by mouth daily. For blood pressure 90 tablet 1   vitamin A 8000 UNIT capsule Take by mouth.     No current facility-administered medications on file prior to visit.      Objective:     Vitals:   07/11/22 0135 07/11/22 1345  BP: (!) 153/99 (!) 153/89  Pulse: 81    Filed Weights   07/11/22 0135  Weight: 128 lb 3.2 oz (58.2 kg)              Pessary Care Pessary removed and cleaned.  Vagina checked by speculum exam- without erosions - pessary replaced.           Assessment:    G2P2002 Patient Active Problem List   Diagnosis Date Noted   Postoperative hypothyroidism 12/06/2018   S/P parathyroidectomy 12/06/2018   Vaginal odor 12/12/2017   Mixed hyperlipidemia 12/04/2017   Incomplete uterovaginal prolapse 05/03/2016   Vitamin D deficiency 03/31/2016   OA (osteoarthritis) of knee 10/25/2015   Nocturia 06/02/2015   Cystocele, midline 06/02/2015   Breast cancer screening 03/10/2015   OA (osteoarthritis) of hip 08/26/2014   Hypercalcemia 07/06/2014   Vaginal erosion secondary to pessary use (HCC) 07/01/2014   Hypothyroid 04/10/2013   Essential hypertension 04/10/2013   Arthritis 04/10/2013   Osteoporosis 04/10/2013     1. Pessary maintenance        Plan:            1.  Pessary replaced.  Follow-up in 6 months. Orders No orders of the defined types were placed in this encounter.   No orders of the defined types were placed in this encounter.     F/U  Return in about 6 months (around  01/11/2023). I spent 21 minutes involved in the care of this patient preparing to see the patient by obtaining and reviewing her medical history (including labs, imaging tests and prior procedures), documenting clinical information in the electronic health record (EHR), counseling and coordinating care plans, writing and sending prescriptions, ordering tests or procedures and in direct communicating with the patient and medical staff discussing pertinent items from her history and physical exam.  Elonda Husky, M.D. 07/11/2022 2:10 PM

## 2022-07-11 NOTE — Progress Notes (Signed)
Patient presents for pessary maintenance. She states no issues at this time.

## 2022-08-07 ENCOUNTER — Other Ambulatory Visit: Payer: Self-pay | Admitting: Obstetrics and Gynecology

## 2022-08-07 DIAGNOSIS — Z4689 Encounter for fitting and adjustment of other specified devices: Secondary | ICD-10-CM

## 2022-08-08 ENCOUNTER — Other Ambulatory Visit: Payer: Self-pay

## 2022-08-08 DIAGNOSIS — Z4689 Encounter for fitting and adjustment of other specified devices: Secondary | ICD-10-CM

## 2022-08-08 MED ORDER — TRIMO-SAN 0.025-0.01 % VA GEL
VAGINAL | 0 refills | Status: DC
Start: 2022-08-08 — End: 2022-09-05

## 2022-08-16 ENCOUNTER — Ambulatory Visit: Payer: Medicare PPO | Admitting: Obstetrics and Gynecology

## 2022-08-16 ENCOUNTER — Encounter: Payer: Self-pay | Admitting: Obstetrics and Gynecology

## 2022-08-16 VITALS — BP 140/94 | HR 93 | Ht 64.0 in | Wt 122.0 lb

## 2022-08-16 DIAGNOSIS — K4090 Unilateral inguinal hernia, without obstruction or gangrene, not specified as recurrent: Secondary | ICD-10-CM
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
## 2022-08-16 NOTE — Progress Notes (Signed)
Patient presents today due to a possible hernia.

## 2022-08-22 DIAGNOSIS — H43813 Vitreous degeneration, bilateral: Secondary | ICD-10-CM | POA: Diagnosis not present

## 2022-08-22 DIAGNOSIS — H35373 Puckering of macula, bilateral: Secondary | ICD-10-CM | POA: Diagnosis not present

## 2022-08-22 DIAGNOSIS — H34832 Tributary (branch) retinal vein occlusion, left eye, with macular edema: Secondary | ICD-10-CM | POA: Diagnosis not present

## 2022-08-22 DIAGNOSIS — H43393 Other vitreous opacities, bilateral: Secondary | ICD-10-CM | POA: Diagnosis not present

## 2022-09-04 ENCOUNTER — Other Ambulatory Visit: Payer: Self-pay | Admitting: Obstetrics and Gynecology

## 2022-09-04 DIAGNOSIS — Z4689 Encounter for fitting and adjustment of other specified devices: Secondary | ICD-10-CM

## 2022-09-20 DIAGNOSIS — L578 Other skin changes due to chronic exposure to nonionizing radiation: Secondary | ICD-10-CM | POA: Diagnosis not present

## 2022-09-20 DIAGNOSIS — L859 Epidermal thickening, unspecified: Secondary | ICD-10-CM | POA: Diagnosis not present

## 2022-09-20 DIAGNOSIS — D485 Neoplasm of uncertain behavior of skin: Secondary | ICD-10-CM | POA: Diagnosis not present

## 2022-09-20 DIAGNOSIS — Z86018 Personal history of other benign neoplasm: Secondary | ICD-10-CM | POA: Diagnosis not present

## 2022-09-20 DIAGNOSIS — Z872 Personal history of diseases of the skin and subcutaneous tissue: Secondary | ICD-10-CM | POA: Diagnosis not present

## 2022-10-02 NOTE — Progress Notes (Unsigned)
There were no vitals taken for this visit.   Subjective:    Patient ID: Haley Schneider, female    DOB: [birth year], 87 y.o.   MRN: [id number]  HPI: Haley Schneider is a 87 y.o. female, here alone  No chief complaint on file.  Hypothyroid: She says that she has had no symptoms with her thyroid.  She is currently taking synthroid 50 mcg daily.  She denies any weight loss or gain, heat or cold sensitivity, chest pain or palpitations.    Hypertension:  -Medications: valsartan 80 mg daily -Patient is compliant with above medications and reports no side effects. -Checking BP at home (average): *** -Highest BP at home: *** -Lowest BP at home: *** -Denies any SOB, CP, vision changes, LE edema or symptoms of hypotension -Diet: recommend DASH diet  -Exercise: recommend 150 min of physical activity weekly      HLD:  -Medications: none -Patient is not on medication -Last lipid panel:  Lipid Panel     Component Value Date/Time   CHOL 184 04/03/2022 1047   CHOL 212 (H) 03/08/2016 1133   TRIG 124 04/03/2022 1047   HDL 57 04/03/2022 1047   HDL 64 03/08/2016 1133   CHOLHDL 3.2 04/03/2022 1047   VLDL 32 (H) 09/01/2015 0953   LDLCALC 104 (H) 04/03/2022 1047   LABVLDL 27 03/08/2016 1133   Prediabetes: last A1C was 5.9.  denies any polyuria, polydipsia or polyphagia.   Relevant past medical, surgical, family and social history reviewed and updated as indicated. Interim medical history since our last visit reviewed. Allergies and medications reviewed and updated.  Review of Systems  Constitutional: Negative for fever or weight change.  Respiratory: Negative for cough and shortness of breath.   Cardiovascular: Negative for chest pain or palpitations.  Gastrointestinal: Negative for abdominal pain, no bowel changes.  Musculoskeletal: Negative for gait problem or joint swelling.  Skin: Negative for rash.  Neurological: Negative for dizziness or headache.  No other specific  complaints in a complete review of systems (except as listed in HPI above).      Objective:    There were no vitals taken for this visit.  Wt Readings from Last 3 Encounters:  08/16/22 122 lb (55.3 kg)  07/11/22 128 lb 3.2 oz (58.2 kg)  04/03/22 137 lb 11.2 oz (62.5 kg)    Physical Exam  Constitutional: Patient appears well-developed and well-nourished. No distress.  HEENT: head atraumatic, normocephalic, pupils equal and reactive to light, neck supple Cardiovascular: Normal rate, regular rhythm and normal heart sounds.  No murmur heard. No BLE edema. Pulmonary/Chest: Effort normal and breath sounds normal. No respiratory distress. Abdominal: Soft.  There is no tenderness. Psychiatric: Patient has a normal mood and affect. behavior is normal. Judgment and thought content normal.   Results for orders placed or performed in visit on 04/03/22  Urine Culture   Specimen: Urine  Result Value Ref Range   MICRO NUMBER: [id number]    SPECIMEN QUALITY: Adequate    Sample Source URINE    STATUS: FINAL    Result:      Less than 10,000 CFU/mL of single Gram positive organism isolated. No further testing will be performed. If clinically indicated, recollection using a method to minimize contamination, with prompt transfer to Urine Culture Transport Tube, is recommended.  CBC with Differential/Platelet  Result Value Ref Range   WBC 7.4 3.8 - 10.8 Thousand/uL   RBC 4.93 3.80 - 5.10 Million/uL   Hemoglobin 13.2 11.7 - 15.5  g/dL   HCT 29.5 62.1 - 30.8 %   MCV 80.7 80.0 - 100.0 fL   MCH 26.8 (L) 27.0 - 33.0 pg   MCHC 33.2 32.0 - 36.0 g/dL   RDW 65.7 84.6 - 96.2 %   Platelets 295 140 - 400 Thousand/uL   MPV 10.3 7.5 - 12.5 fL   Neutro Abs 4,758 1,500 - 7,800 cells/uL   Lymphs Abs 1,887 850 - 3,900 cells/uL   Absolute Monocytes 644 200 - 950 cells/uL   Eosinophils Absolute 81 15 - 500 cells/uL   Basophils Absolute 30 0 - 200 cells/uL   Neutrophils Relative % 64.3 %   Total Lymphocyte 25.5 %    Monocytes Relative 8.7 %   Eosinophils Relative 1.1 %   Basophils Relative 0.4 %  COMPLETE METABOLIC PANEL WITH GFR  Result Value Ref Range   Glucose, Bld 80 65 - 99 mg/dL   BUN 12 7 - 25 mg/dL   Creat 9.52 8.41 - 3.24 mg/dL   eGFR 85 > OR = 60 MW/NUU/7.25D6   BUN/Creatinine Ratio SEE NOTE: 6 - 22 (calc)   Sodium 135 135 - 146 mmol/L   Potassium 4.2 3.5 - 5.3 mmol/L   Chloride 98 98 - 110 mmol/L   CO2 24 20 - 32 mmol/L   Calcium 9.0 8.6 - 10.4 mg/dL   Total Protein 6.8 6.1 - 8.1 g/dL   Albumin 3.9 3.6 - 5.1 g/dL   Globulin 2.9 1.9 - 3.7 g/dL (calc)   AG Ratio 1.3 1.0 - 2.5 (calc)   Total Bilirubin 0.4 0.2 - 1.2 mg/dL   Alkaline phosphatase (APISO) 88 37 - 153 U/L   AST 15 10 - 35 U/L   ALT 11 6 - 29 U/L  Lipid panel  Result Value Ref Range   Cholesterol 184 <200 mg/dL   HDL 57 > OR = 50 mg/dL   Triglycerides 644 <034 mg/dL   LDL Cholesterol (Calc) 104 (H) mg/dL (calc)   Total CHOL/HDL Ratio 3.2 <5.0 (calc)   Non-HDL Cholesterol (Calc) 127 <130 mg/dL (calc)  Hemoglobin V4Q  Result Value Ref Range   Hgb A1c MFr Bld 5.9 (H) <5.7 % of total Hgb   Mean Plasma Glucose 123 mg/dL   eAG (mmol/L) 6.8 mmol/L  TSH  Result Value Ref Range   TSH 1.44 0.40 - 4.50 mIU/L  VITAMIN D 25 Hydroxy (Vit-D Deficiency, Fractures)  Result Value Ref Range   Vit D, 25-Hydroxy 97 30 - 100 ng/mL  Vitamin B12  Result Value Ref Range   Vitamin B-12 493 200 - 1,100 pg/mL  POCT urinalysis dipstick  Result Value Ref Range   Color, UA yellow    Clarity, UA clear    Glucose, UA Negative Negative   Bilirubin, UA negative    Ketones, UA negative    Spec Grav, UA 1.020 1.010 - 1.025   Blood, UA negative    pH, UA 5.0 5.0 - 8.0   Protein, UA Negative Negative   Urobilinogen, UA negative (A) 0.2 or 1.0 E.U./dL   Nitrite, UA negative    Leukocytes, UA Large (3+) (A) Negative   Appearance clear    Odor none       Assessment & Plan:   1. Postoperative hypothyroidism  - SYNTHROID 50 MCG  tablet; Take 1 tablet once daily  Dispense: 90 tablet; Refill: 3 - TSH  2. Essential hypertension  - valsartan (DIOVAN) 80 MG tablet; Take 1 tablet (80 mg total) by mouth daily. For  blood pressure  Dispense: 90 tablet; Refill: 3 - COMPLETE METABOLIC PANEL WITH GFR - CBC with Differential/Platelet  3. Screening for cholesterol level  - Lipid panel  4. Medication management  - TSH - COMPLETE METABOLIC PANEL WITH GFR - CBC with Differential/Platelet   Follow up plan: No follow-ups on file.

## 2022-10-03 ENCOUNTER — Ambulatory Visit
Admission: RE | Admit: 2022-10-03 | Discharge: 2022-10-03 | Disposition: A | Discharge status: Home / Self Care | Payer: Medicare PPO | Source: Ambulatory Visit | Attending: Nurse Practitioner | Admitting: Nurse Practitioner

## 2022-10-03 ENCOUNTER — Ambulatory Visit
Admission: RE | Admit: 2022-10-03 | Discharge: 2022-10-03 | Disposition: A | Discharge status: Home / Self Care | Payer: Medicare PPO | Attending: Nurse Practitioner | Admitting: Nurse Practitioner

## 2022-10-03 ENCOUNTER — Other Ambulatory Visit: Payer: Self-pay

## 2022-10-03 ENCOUNTER — Ambulatory Visit: Payer: Medicare PPO | Admitting: Nurse Practitioner

## 2022-10-03 ENCOUNTER — Encounter: Payer: Self-pay | Admitting: Nurse Practitioner

## 2022-10-03 VITALS — BP 130/72 | HR 97 | Temp 97.5°F | Resp 16 | Ht 64.0 in | Wt 122.7 lb

## 2022-10-03 DIAGNOSIS — I700 Atherosclerosis of aorta: Secondary | ICD-10-CM | POA: Diagnosis not present

## 2022-10-03 DIAGNOSIS — E890 Postprocedural hypothyroidism: Secondary | ICD-10-CM

## 2022-10-03 DIAGNOSIS — I10 Essential (primary) hypertension: Secondary | ICD-10-CM

## 2022-10-03 DIAGNOSIS — R634 Abnormal weight loss: Secondary | ICD-10-CM

## 2022-10-03 DIAGNOSIS — E782 Mixed hyperlipidemia: Secondary | ICD-10-CM | POA: Diagnosis not present

## 2022-10-03 DIAGNOSIS — R7303 Prediabetes: Secondary | ICD-10-CM

## 2022-10-03 DIAGNOSIS — R918 Other nonspecific abnormal finding of lung field: Secondary | ICD-10-CM | POA: Diagnosis not present

## 2022-10-03 DIAGNOSIS — R0602 Shortness of breath: Secondary | ICD-10-CM | POA: Diagnosis not present

## 2022-10-03 MED ORDER — VALSARTAN 80 MG PO TABS
80.0000 mg | ORAL_TABLET | Freq: Every day | ORAL | 1 refills | Status: DC
Start: 2022-10-03 — End: 2023-03-29

## 2022-10-03 NOTE — Assessment & Plan Note (Signed)
Reports weight loss, checking levels, will adjust dose if necessary

## 2022-10-03 NOTE — Assessment & Plan Note (Signed)
Continue lifestyle modification.

## 2022-10-03 NOTE — Assessment & Plan Note (Signed)
Continue valsartan 80 mg daily. 

## 2022-10-04 LAB — COMPLETE METABOLIC PANEL WITH GFR
AG Ratio: 1.4 (calc) (ref 1.0–2.5)
ALT: 9 U/L (ref 6–29)
AST: 14 U/L (ref 10–35)
Albumin: 4.1 g/dL (ref 3.6–5.1)
Alkaline phosphatase (APISO): 79 U/L (ref 37–153)
BUN: 14 mg/dL (ref 7–25)
CO2: 27 mmol/L (ref 20–32)
Calcium: 9.6 mg/dL (ref 8.6–10.4)
Chloride: 96 mmol/L — ABNORMAL LOW (ref 98–110)
Creat: 0.65 mg/dL (ref 0.60–0.95)
Globulin: 3 g/dL (ref 1.9–3.7)
Glucose, Bld: 89 mg/dL (ref 65–99)
Potassium: 4.6 mmol/L (ref 3.5–5.3)
Sodium: 132 mmol/L — ABNORMAL LOW (ref 135–146)
Total Bilirubin: 0.4 mg/dL (ref 0.2–1.2)
Total Protein: 7.1 g/dL (ref 6.1–8.1)
eGFR: 86 mL/min/{1.73_m2} (ref >=60)

## 2022-10-04 LAB — CBC WITH DIFFERENTIAL/PLATELET
Absolute Monocytes: 607 {cells}/uL (ref 200–950)
Basophils Absolute: 41 {cells}/uL (ref 0–200)
Basophils Relative: 0.5 %
Eosinophils Absolute: 57 {cells}/uL (ref 15–500)
Eosinophils Relative: 0.7 %
HCT: 41.4 % (ref 35.0–45.0)
Hemoglobin: 13.7 g/dL (ref 11.7–15.5)
Lymphs Abs: 1829 {cells}/uL (ref 850–3900)
MCH: 28.5 pg (ref 27.0–33.0)
MCHC: 33.1 g/dL (ref 32.0–36.0)
MCV: 86.3 fL (ref 80.0–100.0)
MPV: 10.2 fL (ref 7.5–12.5)
Monocytes Relative: 7.4 %
Neutro Abs: 5666 {cells}/uL (ref 1500–7800)
Neutrophils Relative %: 69.1 %
Platelets: 335 10*3/uL (ref 140–400)
RBC: 4.8 10*6/uL (ref 3.80–5.10)
RDW: 13.6 % (ref 11.0–15.0)
Total Lymphocyte: 22.3 %
WBC: 8.2 10*3/uL (ref 3.8–10.8)

## 2022-10-04 LAB — HIV ANTIBODY (ROUTINE TESTING W REFLEX): HIV 1&2 Ab, 4th Generation: NONREACTIVE

## 2022-10-04 LAB — LIPID PANEL
Cholesterol: 192 mg/dL (ref <200)
HDL: 57 mg/dL (ref >=50)
LDL Cholesterol (Calc): 110 mg/dL — ABNORMAL HIGH
Non-HDL Cholesterol (Calc): 135 mg/dL — ABNORMAL HIGH (ref <130)
Total CHOL/HDL Ratio: 3.4 (calc) (ref <5.0)
Triglycerides: 133 mg/dL (ref <150)

## 2022-10-04 LAB — HEMOGLOBIN A1C
Hgb A1c MFr Bld: 5.7 %{Hb} — ABNORMAL HIGH (ref <5.7)
Mean Plasma Glucose: 117 mg/dL
eAG (mmol/L): 6.5 mmol/L

## 2022-10-04 LAB — TSH: TSH: 2.01 m[IU]/L (ref 0.40–4.50)

## 2022-10-04 LAB — HEPATITIS C ANTIBODY: Hepatitis C Ab: NONREACTIVE

## 2022-10-23 ENCOUNTER — Other Ambulatory Visit: Payer: Self-pay | Admitting: Nurse Practitioner

## 2022-10-23 ENCOUNTER — Telehealth: Payer: Self-pay | Admitting: Nurse Practitioner

## 2022-10-23 DIAGNOSIS — J449 Chronic obstructive pulmonary disease, unspecified: Secondary | ICD-10-CM

## 2022-10-23 MED ORDER — ALBUTEROL SULFATE HFA 108 (90 BASE) MCG/ACT IN AERS
2.0000 | INHALATION_SPRAY | Freq: Four times a day (QID) | RESPIRATORY_TRACT | 8 refills | Status: AC | PRN
Start: 2022-10-23 — End: ?

## 2022-10-23 NOTE — Telephone Encounter (Signed)
Daughter notified 

## 2022-10-23 NOTE — Progress Notes (Signed)
Patient notified
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
## 2022-12-12 ENCOUNTER — Other Ambulatory Visit: Payer: Self-pay | Admitting: Obstetrics and Gynecology

## 2022-12-12 DIAGNOSIS — Z4689 Encounter for fitting and adjustment of other specified devices: Secondary | ICD-10-CM

## 2023-01-08 ENCOUNTER — Other Ambulatory Visit: Payer: Self-pay | Admitting: Obstetrics and Gynecology

## 2023-01-08 DIAGNOSIS — Z4689 Encounter for fitting and adjustment of other specified devices: Secondary | ICD-10-CM

## 2023-01-12 ENCOUNTER — Telehealth: Payer: Self-pay | Admitting: Nurse Practitioner

## 2023-01-12 NOTE — Telephone Encounter (Signed)
 Copied from CRM [id number]. Topic: Appointments - Appointment Scheduling >> Jan 12, 2023  9:30 AM Phill Myron wrote: Pt Haley Schneider calling to ask if she is due for any type of appointment. Please advise

## 2023-01-24 NOTE — Telephone Encounter (Signed)
I called patient to schedule AWV.  Patient said she didn't want to do the AWV. She said she did it once and didn't like it.

## 2023-02-01 ENCOUNTER — Ambulatory Visit: Payer: Medicare PPO | Admitting: Obstetrics and Gynecology

## 2023-02-01 ENCOUNTER — Encounter: Payer: Self-pay | Admitting: Obstetrics and Gynecology

## 2023-02-01 VITALS — BP 134/80 | HR 84 | Ht 64.0 in | Wt 128.3 lb

## 2023-02-01 DIAGNOSIS — Z4689 Encounter for fitting and adjustment of other specified devices: Secondary | ICD-10-CM | POA: Diagnosis not present

## 2023-02-01 NOTE — Progress Notes (Signed)
HPI:      [name] is a 88 y.o. [id number] who LMP was No LMP recorded. Patient is postmenopausal.  Subjective:   She presents today for pessary care.  She reports she is not having any problems from her pessary.  She reports no vaginal bleeding.    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 She does not have any pertinent problems on file. She  has a past surgical history that includes Hernia repair; Tonsillectomy; Thyroidectomy; Total hip arthroplasty; Cataract extraction; Thyroglossal duct cyst; Tonsillectomy; Total hip arthroplasty (Right, 08/26/2014); Eye surgery; Eye surgery; and Total knee arthroplasty (Left, 10/25/2015). Her family history includes Alzheimer's disease in her mother; Breast cancer in her paternal aunt; Cancer in her father; Diabetes in her son; Hypertension in her father; Stroke in her paternal grandfather; Tuberculosis in her paternal grandmother. She  reports that she has never smoked. She has never used smokeless tobacco. She reports that she does not drink alcohol and does not use drugs. She has a current medication list which includes the following prescription(s): albuterol, ascorbic acid, aspirin ec, biotin, calcium-magnesium-zinc, cholecalciferol, ibuprofen, lutein, synthroid, trimo-san, trimo-san, trimo-san, trimo-san, trimo-san, trimo-san, valsartan, and vitamin a. She is allergic to  macrobid [nitrofurantoin].       Review of Systems:  Review of Systems  Constitutional: Denied constitutional symptoms, night sweats, recent illness, fatigue, fever, insomnia and weight loss.  Eyes: Denied eye symptoms, eye pain, photophobia, vision change and visual disturbance.  Ears/Nose/Throat/Neck: Denied ear, nose, throat or neck symptoms, hearing loss, nasal discharge, sinus congestion and sore throat.  Cardiovascular: Denied cardiovascular symptoms, arrhythmia, chest pain/pressure, edema, exercise intolerance, orthopnea and palpitations.  Respiratory: Denied pulmonary symptoms, asthma, pleuritic pain, productive sputum, cough, dyspnea and wheezing.  Gastrointestinal: Denied, gastro-esophageal reflux, melena, nausea and vomiting.  Genitourinary: Denied genitourinary symptoms including symptomatic vaginal discharge, pelvic relaxation issues, and urinary complaints.  Musculoskeletal: Denied musculoskeletal symptoms, stiffness, swelling, muscle weakness and myalgia.  Dermatologic: Denied dermatology symptoms, rash and scar.  Neurologic: Denied neurology symptoms, dizziness, headache, neck pain and syncope.  Psychiatric: Denied psychiatric symptoms, anxiety and depression.  Endocrine: Denied endocrine symptoms including hot flashes and night sweats.   Meds:   Current Outpatient Medications on File Prior to Visit  Medication Sig Dispense Refill   albuterol (VENTOLIN HFA) 108 (90 Base) MCG/ACT inhaler Inhale 2 puffs into the lungs every 6 (six) hours as needed for wheezing or shortness of breath. 18 g 8   ascorbic acid (VITAMIN C) 1000 MG tablet Take 1 tablet by mouth daily.     aspirin EC 81 MG tablet Take by mouth.     Biotin 10 MG CAPS Take by mouth.     CALCIUM-MAGNESIUM-ZINC PO Take by mouth.     cholecalciferol (VITAMIN D) 1000 units tablet Take 1,000 Units by mouth daily.     ibuprofen (ADVIL,MOTRIN) 200 MG tablet Take by mouth.     Lutein 20 MG CAPS Take by mouth.     SYNTHROID  50 MCG tablet Take  1 tablet once daily 90 tablet 3   TRIMO-SAN 0.025-0.01 % GEL Place in vagina daily. 113.4 g 0   TRIMO-SAN 0.025-0.01 % GEL Place in vagina daily. 113.4 g 0   TRIMO-SAN 0.025-0.01 % GEL Place in vagina daily. 113.4 g 0   TRIMO-SAN 0.025-0.01 % GEL Place in vagina daily. 113.4 g 0   TRIMO-SAN 0.025-0.01 % GEL Place in vagina daily. 113.4 g 0   TRIMO-SAN 0.025-0.01 % GEL Place in vagina daily. 113.4 g 0   valsartan (DIOVAN) 80 MG tablet Take 1 tablet (80 mg total) by mouth daily. For blood pressure 90 tablet 1   vitamin A 8000 UNIT capsule Take by mouth.     No current facility-administered medications on file prior to visit.      Objective:     Vitals:   02/01/23 1430  BP: 134/80  Pulse: 84   Filed Weights   02/01/23 1430  Weight: 128 lb 4.8 oz (58.2 kg)              Pessary Care Pessary removed and cleaned.  Vagina checked by speculum exam- without erosions - pessary replaced.  Difficult extraction of pessary-introitus small for pessary           Assessment:    G2P2002 Patient Active Problem List   Diagnosis Date Noted   Postoperative hypothyroidism 12/06/2018   S/P parathyroidectomy 12/06/2018   Vaginal odor 12/12/2017   Mixed hyperlipidemia 12/04/2017   Incomplete uterovaginal prolapse 05/03/2016   Vitamin D deficiency 03/31/2016   OA (osteoarthritis) of knee 10/25/2015   Nocturia 06/02/2015   Cystocele, midline 06/02/2015   Breast cancer screening 03/10/2015   OA (osteoarthritis) of hip 08/26/2014   Hypercalcemia 07/06/2014   Vaginal erosion secondary to pessary use (HCC) 07/01/2014   Hypothyroid 04/10/2013   Essential hypertension 04/10/2013   Arthritis 04/10/2013   Osteoporosis 04/10/2013     1. Pessary maintenance     Patient doing well with pessary although difficult extraction.  Have discussed with patient and would recommend a smaller size for easier removal.   Plan:            1.  Will order smaller pessary and insert at  next visit  2.  Follow-up will be in 5 months. Orders No orders of the defined types were placed in this encounter.   No orders of the defined types were placed in this encounter.     F/U  No follow-ups on file.  Elonda Husky, M.D. 02/01/2023 3:16 PM

## 2023-02-01 NOTE — Progress Notes (Signed)
Patient presents for pessary maintenance. She states still experiencing urine loss, worse when she has the urge to urinate. Continuing to use Trimo-san twice weekly.

## 2023-03-15 ENCOUNTER — Ambulatory Visit: Payer: Medicare PPO | Admitting: Obstetrics and Gynecology

## 2023-03-15 ENCOUNTER — Encounter: Payer: Self-pay | Admitting: Obstetrics and Gynecology

## 2023-03-15 VITALS — BP 144/85 | HR 94 | Ht 64.0 in | Wt 129.2 lb

## 2023-03-15 DIAGNOSIS — Z4689 Encounter for fitting and adjustment of other specified devices: Secondary | ICD-10-CM

## 2023-03-15 NOTE — Progress Notes (Signed)
 Patient presents for pessary maintenance. She states she wants to try the small size, Ring with support size 4. Patient states no other questions or concerns at this time.

## 2023-03-15 NOTE — Progress Notes (Signed)
 HPI:      [name] is a 88 y.o. G2P2002 who LMP was No LMP recorded. Patient is postmenopausal.  Subjective:   She presents today because her new pessary came in and she is interested in having it placed immediately.  Her old pessary had become very difficult to remove and reinsert because of its size.    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 She does not have any pertinent problems on file. She  has a past surgical history that includes Hernia repair; Tonsillectomy; Thyroidectomy; Total hip arthroplasty; Cataract extraction; Thyroglossal duct cyst; Tonsillectomy; Total hip arthroplasty (Right, 08/26/2014); Eye surgery; Eye surgery; and Total knee arthroplasty (Left, 10/25/2015). Her family history includes Alzheimer's disease in her mother; Breast cancer in her paternal aunt; Cancer in her father; Diabetes in her son; Hypertension in her father; Stroke in her paternal grandfather; Tuberculosis in her paternal grandmother. She  reports that she has never smoked. She has never used smokeless tobacco. She reports that she does not drink alcohol and does not use drugs. She has a current medication list which includes the following prescription(s): albuterol, ascorbic acid, aspirin ec, biotin, calcium-magnesium-zinc, cholecalciferol, ibuprofen, lutein, synthroid, trimo-san, trimo-san, trimo-san, trimo-san,  trimo-san, trimo-san, valsartan, and vitamin a. She is allergic to macrobid [nitrofurantoin].       Review of Systems:  Review of Systems  Constitutional: Denied constitutional symptoms, night sweats, recent illness, fatigue, fever, insomnia and weight loss.  Eyes: Denied eye symptoms, eye pain, photophobia, vision change and visual disturbance.  Ears/Nose/Throat/Neck: Denied ear, nose, throat or neck symptoms, hearing loss, nasal discharge, sinus congestion and sore throat.  Cardiovascular: Denied cardiovascular symptoms, arrhythmia, chest pain/pressure, edema, exercise intolerance, orthopnea and palpitations.  Respiratory: Denied pulmonary symptoms, asthma, pleuritic pain, productive sputum, cough, dyspnea and wheezing.  Gastrointestinal: Denied, gastro-esophageal reflux, melena, nausea and vomiting.  Genitourinary: Denied genitourinary symptoms including symptomatic vaginal discharge, pelvic relaxation issues, and urinary complaints.  Musculoskeletal: Denied musculoskeletal symptoms, stiffness, swelling, muscle weakness and myalgia.  Dermatologic: Denied dermatology symptoms, rash and scar.  Neurologic: Denied neurology symptoms, dizziness, headache, neck pain and syncope.  Psychiatric: Denied psychiatric symptoms, anxiety and depression.  Endocrine: Denied endocrine symptoms including hot flashes and night sweats.   Meds:   Current Outpatient Medications on File Prior to Visit  Medication Sig Dispense Refill   albuterol (VENTOLIN HFA) 108 (90 Base) MCG/ACT inhaler Inhale 2 puffs into the lungs every 6 (six) hours as needed for wheezing or shortness of breath. 18 g 8   ascorbic acid (VITAMIN C) 1000 MG tablet Take 1 tablet by mouth daily.     aspirin EC 81 MG tablet Take by mouth.     Biotin 10 MG CAPS Take by mouth.     CALCIUM-MAGNESIUM-ZINC PO Take by mouth.     cholecalciferol (VITAMIN D) 1000 units tablet Take 1,000 Units by mouth daily.     ibuprofen (ADVIL,MOTRIN) 200 MG tablet  Take by mouth.     Lutein 20 MG CAPS Take by  mouth.     SYNTHROID 50 MCG tablet Take 1 tablet once daily 90 tablet 3   TRIMO-SAN 0.025-0.01 % GEL Place in vagina daily. 113.4 g 0   TRIMO-SAN 0.025-0.01 % GEL Place in vagina daily. 113.4 g 0   TRIMO-SAN 0.025-0.01 % GEL Place in vagina daily. 113.4 g 0   TRIMO-SAN 0.025-0.01 % GEL Place in vagina daily. 113.4 g 0   TRIMO-SAN 0.025-0.01 % GEL Place in vagina daily. 113.4 g 0   TRIMO-SAN 0.025-0.01 % GEL Place in vagina daily. 113.4 g 0   valsartan (DIOVAN) 80 MG tablet Take 1 tablet (80 mg total) by mouth daily. For blood pressure 90 tablet 1   vitamin A 8000 UNIT capsule Take by mouth.     No current facility-administered medications on file prior to visit.      Objective:     Vitals:   03/15/23 1055  BP: (!) 161/105  Pulse: 94   Filed Weights   03/15/23 1055  Weight: 129 lb 3.2 oz (58.6 kg)              All pessary removed-replaced with a smaller 1.  Patient tolerated the smaller one much easier.          Assessment:    G2P2002 Patient Active Problem List   Diagnosis Date Noted   Postoperative hypothyroidism 12/06/2018   S/P parathyroidectomy 12/06/2018   Vaginal odor 12/12/2017   Mixed hyperlipidemia 12/04/2017   Incomplete uterovaginal prolapse 05/03/2016   Vitamin D deficiency 03/31/2016   OA (osteoarthritis) of knee 10/25/2015   Nocturia 06/02/2015   Cystocele, midline 06/02/2015   Breast cancer screening 03/10/2015   OA (osteoarthritis) of hip 08/26/2014   Hypercalcemia 07/06/2014   Vaginal erosion secondary to pessary use (HCC) 07/01/2014   Hypothyroid 04/10/2013   Essential hypertension 04/10/2013   Arthritis 04/10/2013   Osteoporosis 04/10/2013     1. Pessary maintenance        Plan:            1.  Pessary removed and replaced with a smaller one. Orders No orders of the defined types were placed in this encounter.   No orders of the defined types were placed in this encounter.      F/U  Return in about 5 months (around 08/15/2023).  Elonda Husky, M.D. 03/15/2023 11:19 AM

## 2023-03-28 ENCOUNTER — Other Ambulatory Visit: Payer: Self-pay | Admitting: Nurse Practitioner

## 2023-03-28 DIAGNOSIS — I10 Essential (primary) hypertension: Secondary | ICD-10-CM

## 2023-03-29 NOTE — Telephone Encounter (Signed)
 Requested Prescriptions  Pending Prescriptions Disp Refills   valsartan (DIOVAN) 80 MG tablet [Pharmacy Med Name: VALSARTAN 80 MG TABLET] 30 tablet 0    Sig: Take 1 tablet (80 mg total) by mouth daily. For blood pressure     Cardiovascular:  Angiotensin Receptor Blockers Failed - 03/29/2023 12:09 PM      Failed - Last BP in normal range    BP Readings from Last 1 Encounters:  03/15/23 (!) 144/85         Failed - Valid encounter within last 6 months    Recent Outpatient Visits   None            Passed - Cr in normal range and within 180 days    Creat  Date Value Ref Range Status  10/03/2022 0.65 0.60 - 0.95 mg/dL Final         Passed - K in normal range and within 180 days    Potassium  Date Value Ref Range Status  10/03/2022 4.6 3.5 - 5.3 mmol/L Final  02/15/2011 3.6 3.5 - 5.1 mmol/L Final         Passed - Patient is not pregnant

## 2023-03-31 ENCOUNTER — Other Ambulatory Visit: Payer: Self-pay | Admitting: Nurse Practitioner

## 2023-03-31 DIAGNOSIS — I10 Essential (primary) hypertension: Secondary | ICD-10-CM

## 2023-04-03 NOTE — Telephone Encounter (Signed)
 Unable to refill per protocol, appointment needed.   Requested Prescriptions  Pending Prescriptions Disp Refills   valsartan (DIOVAN) 80 MG tablet [Pharmacy Med Name: VALSARTAN 80 MG TABLET] 90 tablet 0    Sig: Take 1 tablet (80 mg total) by mouth daily. For blood pressure     Cardiovascular:  Angiotensin Receptor Blockers Failed - 04/03/2023 10:30 AM      Failed - Cr in normal range and within 180 days    Creat  Date Value Ref Range Status  10/03/2022 0.65 0.60 - 0.95 mg/dL Final         Failed - K in normal range and within 180 days    Potassium  Date Value Ref Range Status  10/03/2022 4.6 3.5 - 5.3 mmol/L Final  02/15/2011 3.6 3.5 - 5.1 mmol/L Final         Failed - Last BP in normal range    BP Readings from Last 1 Encounters:  03/15/23 (!) 144/85         Failed - Valid encounter within last 6 months    Recent Outpatient Visits   None            Passed - Patient is not pregnant

## 2023-04-05 ENCOUNTER — Other Ambulatory Visit: Payer: Self-pay | Admitting: Nurse Practitioner

## 2023-04-05 DIAGNOSIS — I10 Essential (primary) hypertension: Secondary | ICD-10-CM

## 2023-04-10 ENCOUNTER — Other Ambulatory Visit: Payer: Self-pay | Admitting: Nurse Practitioner

## 2023-04-10 DIAGNOSIS — I10 Essential (primary) hypertension: Secondary | ICD-10-CM

## 2023-04-11 NOTE — Telephone Encounter (Signed)
 Unable to refill per protocol, courtesy refill already given, no additional refills until OV is scheduled.  Requested Prescriptions  Pending Prescriptions Disp Refills   valsartan (DIOVAN) 80 MG tablet [Pharmacy Med Name: VALSARTAN 80 MG TABLET] 90 tablet 0    Sig: Take 1 tablet (80 mg total) by mouth daily. For blood pressure     Cardiovascular:  Angiotensin Receptor Blockers Failed - 04/11/2023  1:45 PM      Failed - Cr in normal range and within 180 days    Creat  Date Value Ref Range Status  10/03/2022 0.65 0.60 - 0.95 mg/dL Final         Failed - K in normal range and within 180 days    Potassium  Date Value Ref Range Status  10/03/2022 4.6 3.5 - 5.3 mmol/L Final  02/15/2011 3.6 3.5 - 5.1 mmol/L Final         Failed - Last BP in normal range    BP Readings from Last 1 Encounters:  03/15/23 (!) 144/85         Failed - Valid encounter within last 6 months    Recent Outpatient Visits   None            Passed - Patient is not pregnant

## 2023-04-12 NOTE — Progress Notes (Unsigned)
 Name: Haley Schneider   MRN: [id number]    DOB: 1935-02-10   Date:04/13/2023       Progress Note  Subjective  Chief Complaint  Chief Complaint  Patient presents with   Annual Exam   Medication Refill    HPI  Patient presents for annual CPE.  Diet: doesn't cook often, eats out and eats frozen dinners  Exercise: walks around the house every night, walks puppy 3x/day Sleep: averages about 5 hrs of broken sleep Last dental exam: 2 years ago Last eye exam: March 2025  Flowsheet Row Office Visit from 10/03/2022 in [facility]  AUDIT-C Score 0      Depression: Phq 9 is  negative    04/13/2023    9:48 AM 10/03/2022   10:48 AM 04/03/2022   10:04 AM 10/11/2021    2:32 PM 03/25/2021   10:14 AM  Depression screen PHQ 2/9  Decreased Interest 0 0 0 0 0  Down, Depressed, Hopeless 0 0 0 0 0  PHQ - 2 Score 0 0 0 0 0  Altered sleeping 0    0  Tired, decreased energy 0    0  Change in appetite 0    0  Feeling bad or failure about yourself  0    0  Trouble concentrating 0    0  Moving slowly or fidgety/restless 0    0  Suicidal thoughts 0    0  PHQ-9 Score 0    0  Difficult doing work/chores Not difficult at all    Not difficult at all   Hypertension: BP Readings from Last 3 Encounters:  04/13/23 136/84  03/15/23 (!) 144/85  02/01/23 134/80   Obesity: Wt Readings from Last 3 Encounters:  04/13/23 128 lb 1.6 oz (58.1 kg)  03/15/23 129 lb 3.2 oz (58.6 kg)  02/01/23 128 lb 4.8 oz (58.2 kg)   BMI Readings from Last 3 Encounters:  04/13/23 21.99 kg/m  03/15/23 22.18 kg/m  02/01/23 22.02 kg/m     Vaccines:  HPV: up to at age 38 , ask insurance if age between 92-45  Shingrix: 76-64 yo and ask insurance if covered when patient above 13 yo Pneumonia: educated and discussed with patient. Flu: educated and discussed with patient.  Hep C Screening: completed 10/04/2022 STD testing and prevention (HIV/chl/gon/syphilis): completed 10/04/2022 Intimate  partner violence: none Sexual History : not active Menstrual History/LMP/Abnormal Bleeding: postmenopausal  Incontinence Symptoms: pessary in place  Breast cancer:  - Last Mammogram: 02/05/2019, no longer wants screening - BRCA gene screening: n/a  Osteoporosis: Discussed high calcium and vitamin D supplementation, weight bearing exercises  Cervical cancer screening: n/a  Skin cancer: Discussed monitoring for atypical lesions  Colorectal cancer: no longer screening   Lung cancer:  n/a Low Dose CT Chest recommended if Age 46-80 years, 20 pack-year currently smoking OR have quit w/in 15years. Patient does not qualify.   ECG: 10/18/15  Advanced Care Planning: A voluntary discussion about advance care planning including the explanation and discussion of advance directives.  Discussed health care proxy and Living will, and the patient was able to identify a health care proxy as Daughter.  Patient does have a living will at present time. If patient does have living will, I have requested they bring this to the clinic to be scanned in to their chart.  Lipids: Lab Results  Component Value Date   CHOL 192 10/03/2022   CHOL 184 04/03/2022   CHOL 203 (H) 03/25/2021  Lab Results  Component Value Date   HDL 57 10/03/2022   HDL 57 04/03/2022   HDL 57 03/25/2021   Lab Results  Component Value Date   LDLCALC 110 (H) 10/03/2022   LDLCALC 104 (H) 04/03/2022   LDLCALC 124 (H) 03/25/2021   Lab Results  Component Value Date   TRIG 133 10/03/2022   TRIG 124 04/03/2022   TRIG 117 03/25/2021   Lab Results  Component Value Date   CHOLHDL 3.4 10/03/2022   CHOLHDL 3.2 04/03/2022   CHOLHDL 3.6 03/25/2021   No results found for: "LDLDIRECT"  Glucose: Glucose  Date Value Ref Range Status  02/15/2011 80 65 - 99 mg/dL Final   Glucose, Bld  Date Value Ref Range Status  10/03/2022 89 65 - 99 mg/dL Final    Comment:    .            Fasting reference interval .   04/03/2022 80 65 - 99  mg/dL Final    Comment:    .            Fasting reference interval .   03/25/2021 84 65 - 99 mg/dL Final    Comment:    .            Fasting reference interval .     Patient Active Problem List   Diagnosis Date Noted   Postoperative hypothyroidism 12/06/2018   S/P parathyroidectomy 12/06/2018   Vaginal odor 12/12/2017   Mixed hyperlipidemia 12/04/2017   Incomplete uterovaginal prolapse 05/03/2016   Vitamin D deficiency 03/31/2016   OA (osteoarthritis) of knee 10/25/2015   Nocturia 06/02/2015   Cystocele, midline 06/02/2015   Breast cancer screening 03/10/2015   OA (osteoarthritis) of hip 08/26/2014   Hypercalcemia 07/06/2014   Vaginal erosion secondary to pessary use (HCC) 07/01/2014   Hypothyroid 04/10/2013   Essential hypertension 04/10/2013   Arthritis 04/10/2013   Osteoporosis 04/10/2013    Past Surgical History:  Procedure Laterality Date   CATARACT EXTRACTION     EYE SURGERY     cataract surgery bilat    EYE SURGERY     laser eye surgery left eye"retinal acclution"laser   HERNIA REPAIR     THYROGLOSSAL DUCT CYST     THYROIDECTOMY     TONSILLECTOMY     TONSILLECTOMY     TOTAL HIP ARTHROPLASTY     left hip 7 years ago    TOTAL HIP ARTHROPLASTY Right 08/26/2014   Procedure: RIGHT TOTAL HIP ARTHROPLASTY ANTERIOR APPROACH;  Surgeon: Ollen Gross, MD;  Location: [facility];  Service: Orthopedics;  Laterality: Right;   TOTAL KNEE ARTHROPLASTY Left 10/25/2015   Procedure: LEFT TOTAL KNEE ARTHROPLASTY;  Surgeon: Ollen Gross, MD;  Location: [facility];  Service: Orthopedics;  Laterality: Left;    Family History  Problem Relation Age of Onset   Alzheimer's disease Mother    Cancer Father        lung   Hypertension Father    Stroke Paternal Grandfather    Tuberculosis Paternal Grandmother    Diabetes Son    Breast cancer Paternal Aunt    Heart disease Neg Hx     Social History   Socioeconomic History   Marital status: Widowed    Spouse name: Not on file    Number of children: 2   Years of education: Not on file   Highest education level: Not on file  Occupational History   Occupation: retired  Tobacco Use   Smoking status: Never  Smokeless tobacco: Never  Vaping Use   Vaping status: Never Used  Substance and Sexual Activity   Alcohol use: No   Drug use: No   Sexual activity: Not Currently    Birth control/protection: Surgical  Other Topics Concern   Not on file  Social History Narrative   Not on file   Social Drivers of Health   Financial Resource Strain: Low Risk  (04/03/2022)   Overall Financial Resource Strain (CARDIA)    Difficulty of Paying Living Expenses: Not hard at all  Food Insecurity: No Food Insecurity (04/03/2022)   Hunger Vital Sign    Worried About Running Out of Food in the Last Year: Never true    Ran Out of Food in the Last Year: Never true  Transportation Needs: No Transportation Needs (04/03/2022)   PRAPARE - Administrator, Civil Service (Medical): No    Lack of Transportation (Non-Medical): No  Physical Activity: Sufficiently Active (04/03/2022)   Exercise Vital Sign    Days of Exercise per Week: 5 days    Minutes of Exercise per Session: 40 min  Stress: No Stress Concern Present (04/03/2022)   [employer] of Occupational Health - Occupational Stress Questionnaire    Feeling of Stress : Not at all  Social Connections: Moderately Isolated (04/03/2022)   Social Connection and Isolation Panel [NHANES]    Frequency of Communication with Friends and Family: Twice a week    Frequency of Social Gatherings with Friends and Family: Twice a week    Attends Religious Services: 1 to 4 times per year    Active Member of [employer] or Organizations: No    Attends Banker Meetings: Never    Marital Status: Widowed  Intimate Partner Violence: Not At Risk (04/03/2022)   Humiliation, Afraid, Rape, and Kick questionnaire    Fear of Current or Ex-Partner: No    Emotionally Abused: No    Physically  Abused: No    Sexually Abused: No     Current Outpatient Medications:    albuterol (VENTOLIN HFA) 108 (90 Base) MCG/ACT inhaler, Inhale 2 puffs into the lungs every 6 (six) hours as needed for wheezing or shortness of breath., Disp: 18 g, Rfl: 8   ascorbic acid (VITAMIN C) 1000 MG tablet, Take 1 tablet by mouth daily., Disp: , Rfl:    aspirin EC 81 MG tablet, Take by mouth., Disp: , Rfl:    Biotin 10 MG CAPS, Take by mouth., Disp: , Rfl:    CALCIUM-MAGNESIUM-ZINC PO, Take by mouth., Disp: , Rfl:    cholecalciferol (VITAMIN D) 1000 units tablet, Take 1,000 Units by mouth daily., Disp: , Rfl:    ibuprofen (ADVIL,MOTRIN) 200 MG tablet, Take by mouth., Disp: , Rfl:    Lutein 20 MG CAPS, Take by mouth., Disp: , Rfl:    SYNTHROID 50 MCG tablet, Take 1 tablet once daily, Disp: 90 tablet, Rfl: 3   TRIMO-SAN 0.025-0.01 % GEL, Place in vagina daily., Disp: 113.4 g, Rfl: 0   TRIMO-SAN 0.025-0.01 % GEL, Place in vagina daily., Disp: 113.4 g, Rfl: 0   vitamin A 8000 UNIT capsule, Take by mouth., Disp: , Rfl:    valsartan (DIOVAN) 80 MG tablet, Take 1 tablet (80 mg total) by mouth daily., Disp: 90 tablet, Rfl: 1  Allergies  Allergen Reactions   Macrobid [Nitrofurantoin] Hives     ROS   Constitutional: Negative for fever or weight change.  Respiratory: Negative for cough and shortness of breath.   Cardiovascular:  Negative for chest pain or palpitations.  Gastrointestinal: Negative for abdominal pain, no bowel changes.  Musculoskeletal: Negative for gait problem or joint swelling.  Skin: Negative for rash.  Neurological: Negative for dizziness or headache.  No other specific complaints in a complete review of systems (except as listed in HPI above).  Objective  Vitals:   04/13/23 0947  BP: 136/84  Pulse: 76  Resp: 18  SpO2: 95%  Weight: 128 lb 1.6 oz (58.1 kg)  Height: 5\' 4"  (1.626 m)    Body mass index is 21.99 kg/m.  Physical Exam Vitals reviewed.  Constitutional:       Appearance: Normal appearance.  HENT:     Head: Normocephalic.     Right Ear: Tympanic membrane normal.     Left Ear: Tympanic membrane normal.     Nose: Nose normal.  Eyes:     Extraocular Movements: Extraocular movements intact.     Conjunctiva/sclera: Conjunctivae normal.     Pupils: Pupils are equal, round, and reactive to light.  Neck:     Thyroid: No thyroid mass, thyromegaly or thyroid tenderness.  Cardiovascular:     Rate and Rhythm: Normal rate and regular rhythm.     Pulses: Normal pulses.     Heart sounds: Normal heart sounds.  Pulmonary:     Effort: Pulmonary effort is normal.     Breath sounds: Normal breath sounds.  Abdominal:     General: Bowel sounds are normal.     Palpations: Abdomen is soft.  Musculoskeletal:        General: Normal range of motion.     Cervical back: Normal range of motion and neck supple.     Right lower leg: No edema.     Left lower leg: No edema.  Skin:    General: Skin is warm and dry.     Capillary Refill: Capillary refill takes less than 2 seconds.  Neurological:     General: No focal deficit present.     Mental Status: She is alert and oriented to person, place, and time. Mental status is at baseline.  Psychiatric:        Mood and Affect: Mood normal.        Behavior: Behavior normal.        Thought Content: Thought content normal.        Judgment: Judgment normal.      No results found for this or any previous visit (from the past 2160 hours).  Fall Risk:    04/13/2023    9:48 AM 10/03/2022   10:48 AM 04/03/2022   10:03 AM 10/11/2021    2:32 PM 03/25/2021   10:14 AM  Fall Risk   Falls in the past year? 0 0 0 0 0  Number falls in past yr: 0 0 0 0 0  Injury with Fall? 0 0 0 0 0  Risk for fall due to :  No Fall Risks   No Fall Risks  Follow up Falls evaluation completed Falls prevention discussed  Falls evaluation completed Falls prevention discussed    Functional Status Survey: Is the patient deaf or have difficulty  hearing?: No Does the patient have difficulty seeing, even when wearing glasses/contacts?: No Does the patient have difficulty concentrating, remembering, or making decisions?: No Does the patient have difficulty walking or climbing stairs?: No Does the patient have difficulty dressing or bathing?: No Does the patient have difficulty doing errands alone such as visiting a doctor's office or shopping?: No  Assessment & Plan  1. Annual physical exam (Primary) - CBC with Differential/Platelet - Hemoglobin A1c - Comprehensive metabolic panel with GFR - Lipid panel - TSH  2. Postoperative hypothyroidism - TSH  3. Mixed hyperlipidemia - Lipid panel - Comprehensive metabolic panel with GFR  4. Prediabetes - Hemoglobin A1c - Comprehensive metabolic panel with GFR  5. Screening for deficiency anemia - CBC with Differential/Platelet  6. Essential hypertension - valsartan (DIOVAN) 80 MG tablet; Take 1 tablet (80 mg total) by mouth daily.  Dispense: 90 tablet; Refill: 1   -declined health maintenance including bone density and mammogram  -USPSTF grade A and B recommendations reviewed with patient; age-appropriate recommendations, preventive care, screening tests, etc discussed and encouraged; healthy living encouraged; see AVS for patient education given to patient -Discussed importance of 150 minutes of physical activity weekly, eat two servings of fish weekly, eat one serving of tree nuts ( cashews, pistachios, pecans, almonds.Marland Kitchen) every other day, eat 6 servings of fruit/vegetables daily and drink plenty of water and avoid sweet beverages.   -Reviewed Health Maintenance: yes

## 2023-04-13 ENCOUNTER — Encounter: Payer: Self-pay | Admitting: Nurse Practitioner

## 2023-04-13 ENCOUNTER — Ambulatory Visit (INDEPENDENT_AMBULATORY_CARE_PROVIDER_SITE_OTHER): Admitting: Nurse Practitioner

## 2023-04-13 VITALS — BP 136/84 | HR 76 | Resp 18 | Ht 64.0 in | Wt 128.1 lb

## 2023-04-13 DIAGNOSIS — R7303 Prediabetes: Secondary | ICD-10-CM

## 2023-04-13 DIAGNOSIS — E890 Postprocedural hypothyroidism: Secondary | ICD-10-CM

## 2023-04-13 DIAGNOSIS — E782 Mixed hyperlipidemia: Secondary | ICD-10-CM | POA: Diagnosis not present

## 2023-04-13 DIAGNOSIS — Z0001 Encounter for general adult medical examination with abnormal findings: Secondary | ICD-10-CM

## 2023-04-13 DIAGNOSIS — I10 Essential (primary) hypertension: Secondary | ICD-10-CM

## 2023-04-13 DIAGNOSIS — Z0000 Encounter for general adult medical examination without abnormal findings: Secondary | ICD-10-CM

## 2023-04-13 DIAGNOSIS — Z130 Encounter for screening for diseases of the blood and blood-forming organs and certain disorders involving the immune mechanism: Secondary | ICD-10-CM

## 2023-04-13 MED ORDER — VALSARTAN 80 MG PO TABS
80.0000 mg | ORAL_TABLET | Freq: Every day | ORAL | 1 refills | Status: DC
Start: 2023-04-13 — End: 2023-04-13

## 2023-04-13 MED ORDER — VALSARTAN 80 MG PO TABS: 80.0000 mg | ORAL_TABLET | Freq: Every day | ORAL | 1 refills | Status: DC

## 2023-04-13 NOTE — Addendum Note (Signed)
 Addended by: Della Goo F on: 04/13/2023 11:47 AM   Modules accepted: Level of Service

## 2023-04-14 LAB — CBC WITH DIFFERENTIAL/PLATELET
Absolute Lymphocytes: 1995 {cells}/uL (ref 850–3900)
Absolute Monocytes: 540 {cells}/uL (ref 200–950)
Basophils Absolute: 38 {cells}/uL (ref 0–200)
Basophils Relative: 0.5 %
Eosinophils Absolute: 68 {cells}/uL (ref 15–500)
Eosinophils Relative: 0.9 %
HCT: 43.4 % (ref 35.0–45.0)
Hemoglobin: 14.2 g/dL (ref 11.7–15.5)
MCH: 27.8 pg (ref 27.0–33.0)
MCHC: 32.7 g/dL (ref 32.0–36.0)
MCV: 85.1 fL (ref 80.0–100.0)
MPV: 10.6 fL (ref 7.5–12.5)
Monocytes Relative: 7.2 %
Neutro Abs: 4860 {cells}/uL (ref 1500–7800)
Neutrophils Relative %: 64.8 %
Platelets: 292 10*3/uL (ref 140–400)
RBC: 5.1 10*6/uL (ref 3.80–5.10)
RDW: 13.9 % (ref 11.0–15.0)
Total Lymphocyte: 26.6 %
WBC: 7.5 10*3/uL (ref 3.8–10.8)

## 2023-04-14 LAB — COMPREHENSIVE METABOLIC PANEL WITH GFR
AG Ratio: 1.7 (calc) (ref 1.0–2.5)
ALT: 10 U/L (ref 6–29)
AST: 15 U/L (ref 10–35)
Albumin: 4.1 g/dL (ref 3.6–5.1)
Alkaline phosphatase (APISO): 82 U/L (ref 37–153)
BUN: 11 mg/dL (ref 7–25)
CO2: 26 mmol/L (ref 20–32)
Calcium: 9.3 mg/dL (ref 8.6–10.4)
Chloride: 99 mmol/L (ref 98–110)
Creat: 0.69 mg/dL (ref 0.60–0.95)
Globulin: 2.4 g/dL (ref 1.9–3.7)
Glucose, Bld: 90 mg/dL (ref 65–139)
Potassium: 4.2 mmol/L (ref 3.5–5.3)
Sodium: 132 mmol/L — ABNORMAL LOW (ref 135–146)
Total Bilirubin: 0.6 mg/dL (ref 0.2–1.2)
Total Protein: 6.5 g/dL (ref 6.1–8.1)
eGFR: 84 mL/min/{1.73_m2} (ref >=60)

## 2023-04-14 LAB — LIPID PANEL
Cholesterol: 195 mg/dL (ref <200)
HDL: 67 mg/dL (ref >=50)
LDL Cholesterol (Calc): 105 mg/dL — ABNORMAL HIGH
Non-HDL Cholesterol (Calc): 128 mg/dL (ref <130)
Total CHOL/HDL Ratio: 2.9 (calc) (ref <5.0)
Triglycerides: 125 mg/dL (ref <150)

## 2023-04-14 LAB — HEMOGLOBIN A1C
Hgb A1c MFr Bld: 5.6 %{Hb} (ref <5.7)
Mean Plasma Glucose: 114 mg/dL
eAG (mmol/L): 6.3 mmol/L

## 2023-04-14 LAB — TSH: TSH: 1.35 m[IU]/L (ref 0.40–4.50)

## 2023-05-28 ENCOUNTER — Other Ambulatory Visit: Payer: Self-pay | Admitting: Nurse Practitioner

## 2023-05-28 DIAGNOSIS — E890 Postprocedural hypothyroidism: Secondary | ICD-10-CM

## 2023-05-31 NOTE — Telephone Encounter (Signed)
 Requested Prescriptions  Pending Prescriptions Disp Refills   SYNTHROID  50 MCG tablet [Pharmacy Med Name: SYNTHROID  50 MCG TABLET] 90 tablet 0    Sig: Take 1 tablet once daily     Endocrinology:  Hypothyroid Agents Failed - 05/31/2023 12:09 PM      Failed - Valid encounter within last 12 months    Recent Outpatient Visits           1 month ago Annual physical exam   [facility] Donny Gall F, FNP              Passed - TSH in normal range and within 360 days    TSH  Date Value Ref Range Status  04/13/2023 1.35 0.40 - 4.50 mIU/L Final

## 2023-06-04 ENCOUNTER — Other Ambulatory Visit: Payer: Self-pay | Admitting: Obstetrics and Gynecology

## 2023-06-04 DIAGNOSIS — Z4689 Encounter for fitting and adjustment of other specified devices: Secondary | ICD-10-CM

## 2023-06-07 ENCOUNTER — Telehealth: Payer: Self-pay

## 2023-06-07 NOTE — Telephone Encounter (Signed)
 Refill sent in 06/06/23.

## 2023-07-19 ENCOUNTER — Ambulatory Visit: Admitting: Nurse Practitioner

## 2023-07-20 ENCOUNTER — Ambulatory Visit: Admitting: Nurse Practitioner

## 2023-07-20 NOTE — Progress Notes (Deleted)
 There were no vitals taken for this visit.   Subjective:    Patient ID: Haley Schneider, female    DOB: 01-15-1935, 88 y.o.   MRN: [id number]  HPI: Haley Schneider is a 88 y.o. female  No chief complaint on file.   Discussed the use of AI scribe software for clinical note transcription with the patient, who gave verbal consent to proceed.  History of Present Illness          04/13/2023    9:48 AM 10/03/2022   10:48 AM 04/03/2022   10:04 AM  Depression screen PHQ 2/9  Decreased Interest 0 0 0  Down, Depressed, Hopeless 0 0 0  PHQ - 2 Score 0 0 0  Altered sleeping 0    Tired, decreased energy 0    Change in appetite 0    Feeling bad or failure about yourself  0    Trouble concentrating 0    Moving slowly or fidgety/restless 0    Suicidal thoughts 0    PHQ-9 Score 0    Difficult doing work/chores Not difficult at all      Relevant past medical, surgical, family and social history reviewed and updated as indicated. Interim medical history since our last visit reviewed. Allergies and medications reviewed and updated.  Review of Systems  Per HPI unless specifically indicated above     Objective:     There were no vitals taken for this visit.  {Vitals History (Optional):23777} Wt Readings from Last 3 Encounters:  04/13/23 128 lb 1.6 oz (58.1 kg)  03/15/23 129 lb 3.2 oz (58.6 kg)  02/01/23 128 lb 4.8 oz (58.2 kg)    Physical Exam Physical Exam    Results for orders placed or performed in visit on 04/13/23  TSH   Collection Time: 04/13/23 11:04 AM  Result Value Ref Range   TSH 1.35 0.40 - 4.50 mIU/L  Hemoglobin A1c   Collection Time: 04/13/23 11:04 AM  Result Value Ref Range   Hgb A1c MFr Bld 5.6 <5.7 % of total Hgb   Mean Plasma Glucose 114 mg/dL   eAG (mmol/L) 6.3 mmol/L  Lipid panel   Collection Time: 04/13/23 11:04 AM  Result Value Ref Range   Cholesterol 195 <200 mg/dL   HDL 67 > OR = 50 mg/dL   Triglycerides 874 <849 mg/dL   LDL Cholesterol  (Calc) 105 (H) mg/dL (calc)   Total CHOL/HDL Ratio 2.9 <5.0 (calc)   Non-HDL Cholesterol (Calc) 128 <130 mg/dL (calc)  CBC with Differential/Platelet   Collection Time: 04/13/23 11:04 AM  Result Value Ref Range   WBC 7.5 3.8 - 10.8 Thousand/uL   RBC 5.10 3.80 - 5.10 Million/uL   Hemoglobin 14.2 11.7 - 15.5 g/dL   HCT 56.5 64.9 - 54.9 %   MCV 85.1 80.0 - 100.0 fL   MCH 27.8 27.0 - 33.0 pg   MCHC 32.7 32.0 - 36.0 g/dL   RDW 86.0 88.9 - 84.9 %   Platelets 292 140 - 400 Thousand/uL   MPV 10.6 7.5 - 12.5 fL   Neutro Abs 4,860 1,500 - 7,800 cells/uL   Absolute Lymphocytes 1,995 850 - 3,900 cells/uL   Absolute Monocytes 540 200 - 950 cells/uL   Eosinophils Absolute 68 15 - 500 cells/uL   Basophils Absolute 38 0 - 200 cells/uL   Neutrophils Relative % 64.8 %   Total Lymphocyte 26.6 %   Monocytes Relative 7.2 %   Eosinophils Relative 0.9 %   Basophils Relative  0.5 %  Comprehensive metabolic panel with GFR   Collection Time: 04/13/23 11:04 AM  Result Value Ref Range   Glucose, Bld 90 65 - 139 mg/dL   BUN 11 7 - 25 mg/dL   Creat 9.30 9.39 - 9.04 mg/dL   eGFR 84 > OR = 60 fO/fpw/8.26f7   BUN/Creatinine Ratio SEE NOTE: 6 - 22 (calc)   Sodium 132 (L) 135 - 146 mmol/L   Potassium 4.2 3.5 - 5.3 mmol/L   Chloride 99 98 - 110 mmol/L   CO2 26 20 - 32 mmol/L   Calcium 9.3 8.6 - 10.4 mg/dL   Total Protein 6.5 6.1 - 8.1 g/dL   Albumin 4.1 3.6 - 5.1 g/dL   Globulin 2.4 1.9 - 3.7 g/dL (calc)   AG Ratio 1.7 1.0 - 2.5 (calc)   Total Bilirubin 0.6 0.2 - 1.2 mg/dL   Alkaline phosphatase (APISO) 82 37 - 153 U/L   AST 15 10 - 35 U/L   ALT 10 6 - 29 U/L   {Labs (Optional):23779}       Assessment & Plan:   Problem List Items Addressed This Visit   None    Assessment and Plan Assessment & Plan         Follow up plan: No follow-ups on file.

## 2023-07-23 NOTE — Progress Notes (Signed)
 BP 118/82   Pulse 86   Resp 16   Ht 5' 4 (1.626 m)   Wt 134 lb (60.8 kg)   SpO2 99%   BMI 23.00 kg/m    Subjective:    Patient ID: Haley Schneider, female    DOB: 14-Jun-1935, 88 y.o.   MRN: [id number]  HPI: Haley Schneider is a 88 y.o. female  Chief Complaint  Patient presents with   Medical Management of Chronic Issues    Discussed the use of AI scribe software for clinical note transcription with the patient, who gave verbal consent to proceed.  History of Present Illness Haley Schneider is an 88 year old female who presents for a routine follow-up.  Hypertension - Takes Valsartan  80 mg daily - Recently refilled medication with a 90-day supply - No new symptoms related to blood pressure  Hypothyroidism - Takes Synthroid  50 mcg daily - TSH levels checked in April were normal - Blood work typically performed annually - No new symptoms related to hypothyroidism  Arthralgia and osteoarthritis - Increased joint pain, worsened by rainy weather - Manages pain with Advil, which provides relief  Osteoporosis - No new symptoms or fractures reported  Hyperlipidemia and glycemic control - Recent lipid panel showed elevated LDL of 104 - A1c was normal at 5.6  Chronic obstructive pulmonary disease (copd) - Uses albuterol  inhaler as needed, infrequent use - No current cough - Avoids hot weather due to its effect on breathing         04/13/2023    9:48 AM 10/03/2022   10:48 AM 04/03/2022   10:04 AM  Depression screen PHQ 2/9  Decreased Interest 0 0 0  Down, Depressed, Hopeless 0 0 0  PHQ - 2 Score 0 0 0  Altered sleeping 0    Tired, decreased energy 0    Change in appetite 0    Feeling bad or failure about yourself  0    Trouble concentrating 0    Moving slowly or fidgety/restless 0    Suicidal thoughts 0    PHQ-9 Score 0    Difficult doing work/chores Not difficult at all      Relevant past medical, surgical, family and social history reviewed and  updated as indicated. Interim medical history since our last visit reviewed. Allergies and medications reviewed and updated.  Review of Systems  Constitutional: Negative for fever or weight change.  Respiratory: Negative for cough and shortness of breath.   Cardiovascular: Negative for chest pain or palpitations.  Gastrointestinal: Negative for abdominal pain, no bowel changes.  Musculoskeletal: Negative for gait problem or joint swelling.  Skin: Negative for rash.  Neurological: Negative for dizziness or headache.  No other specific complaints in a complete review of systems (except as listed in HPI above).      Objective:     BP 118/82   Pulse 86   Resp 16   Ht 5' 4 (1.626 m)   Wt 134 lb (60.8 kg)   SpO2 99%   BMI 23.00 kg/m    Wt Readings from Last 3 Encounters:  07/24/23 134 lb (60.8 kg)  04/13/23 128 lb 1.6 oz (58.1 kg)  03/15/23 129 lb 3.2 oz (58.6 kg)    Physical Exam Physical Exam GENERAL: Alert, cooperative, well developed, no acute distress HEENT: Normocephalic, normal oropharynx, moist mucous membranes CHEST: Clear to auscultation bilaterally, no wheezes, rhonchi, or crackles CARDIOVASCULAR: Normal heart rate and rhythm, S1 and S2 normal without murmurs ABDOMEN: Soft,  non-tender, non-distended, without organomegaly, normal bowel sounds EXTREMITIES: No cyanosis or edema NEUROLOGICAL: Cranial nerves grossly intact, moves all extremities without gross motor or sensory deficit   Results for orders placed or performed in visit on 04/13/23  TSH   Collection Time: 04/13/23 11:04 AM  Result Value Ref Range   TSH 1.35 0.40 - 4.50 mIU/L  Hemoglobin A1c   Collection Time: 04/13/23 11:04 AM  Result Value Ref Range   Hgb A1c MFr Bld 5.6 <5.7 % of total Hgb   Mean Plasma Glucose 114 mg/dL   eAG (mmol/L) 6.3 mmol/L  Lipid panel   Collection Time: 04/13/23 11:04 AM  Result Value Ref Range   Cholesterol 195 <200 mg/dL   HDL 67 > OR = 50 mg/dL   Triglycerides 874  <849 mg/dL   LDL Cholesterol (Calc) 105 (H) mg/dL (calc)   Total CHOL/HDL Ratio 2.9 <5.0 (calc)   Non-HDL Cholesterol (Calc) 128 <130 mg/dL (calc)  CBC with Differential/Platelet   Collection Time: 04/13/23 11:04 AM  Result Value Ref Range   WBC 7.5 3.8 - 10.8 Thousand/uL   RBC 5.10 3.80 - 5.10 Million/uL   Hemoglobin 14.2 11.7 - 15.5 g/dL   HCT 56.5 64.9 - 54.9 %   MCV 85.1 80.0 - 100.0 fL   MCH 27.8 27.0 - 33.0 pg   MCHC 32.7 32.0 - 36.0 g/dL   RDW 86.0 88.9 - 84.9 %   Platelets 292 140 - 400 Thousand/uL   MPV 10.6 7.5 - 12.5 fL   Neutro Abs 4,860 1,500 - 7,800 cells/uL   Absolute Lymphocytes 1,995 850 - 3,900 cells/uL   Absolute Monocytes 540 200 - 950 cells/uL   Eosinophils Absolute 68 15 - 500 cells/uL   Basophils Absolute 38 0 - 200 cells/uL   Neutrophils Relative % 64.8 %   Total Lymphocyte 26.6 %   Monocytes Relative 7.2 %   Eosinophils Relative 0.9 %   Basophils Relative 0.5 %  Comprehensive metabolic panel with GFR   Collection Time: 04/13/23 11:04 AM  Result Value Ref Range   Glucose, Bld 90 65 - 139 mg/dL   BUN 11 7 - 25 mg/dL   Creat 9.30 9.39 - 9.04 mg/dL   eGFR 84 > OR = 60 fO/fpw/8.26f7   BUN/Creatinine Ratio SEE NOTE: 6 - 22 (calc)   Sodium 132 (L) 135 - 146 mmol/L   Potassium 4.2 3.5 - 5.3 mmol/L   Chloride 99 98 - 110 mmol/L   CO2 26 20 - 32 mmol/L   Calcium 9.3 8.6 - 10.4 mg/dL   Total Protein 6.5 6.1 - 8.1 g/dL   Albumin 4.1 3.6 - 5.1 g/dL   Globulin 2.4 1.9 - 3.7 g/dL (calc)   AG Ratio 1.7 1.0 - 2.5 (calc)   Total Bilirubin 0.6 0.2 - 1.2 mg/dL   Alkaline phosphatase (APISO) 82 37 - 153 U/L   AST 15 10 - 35 U/L   ALT 10 6 - 29 U/L          Assessment & Plan:   Problem List Items Addressed This Visit       Cardiovascular and Mediastinum   Essential hypertension - Primary     Respiratory   Chronic obstructive pulmonary disease (HCC)     Endocrine   Postoperative hypothyroidism   Relevant Medications   SYNTHROID  50 MCG tablet      Musculoskeletal and Integument   Arthritis   Osteoporosis     Other   Mixed hyperlipidemia   Other  Visit Diagnoses       Encounter for completion of form with patient            Assessment and Plan Assessment & Plan Osteoarthritis Increased joint pain, possibly related to weather changes. Currently managed with over-the-counter Advil, which provides relief. - Continue using Advil as needed for pain relief.  Hypertension Blood pressure is well-controlled on current medication regimen. No new symptoms reported. - Continue Valsartan  80 mg daily.  Chronic obstructive pulmonary disease (COPD) No recent exacerbations or increased use of albuterol  inhaler. She avoids hot weather to prevent symptoms. - Continue using albuterol  inhaler as needed. - Avoid exposure to hot weather to prevent symptom exacerbation.  Hypothyroidism Thyroid  function is stable. Recent TSH levels were normal. No new symptoms reported. - Continue Synthroid  50 mcg daily. - Annual thyroid  function tests.  Hyperlipidemia LDL cholesterol is elevated at 104 mg/dL.  Forms filled out for DMV      Follow up plan: Return in about 6 months (around 01/24/2024) for follow up.

## 2023-07-24 ENCOUNTER — Encounter: Payer: Self-pay | Admitting: Nurse Practitioner

## 2023-07-24 ENCOUNTER — Ambulatory Visit: Admitting: Nurse Practitioner

## 2023-07-24 VITALS — BP 118/82 | HR 86 | Resp 16 | Ht 64.0 in | Wt 134.0 lb

## 2023-07-24 DIAGNOSIS — M810 Age-related osteoporosis without current pathological fracture: Secondary | ICD-10-CM

## 2023-07-24 DIAGNOSIS — I10 Essential (primary) hypertension: Secondary | ICD-10-CM

## 2023-07-24 DIAGNOSIS — E782 Mixed hyperlipidemia: Secondary | ICD-10-CM

## 2023-07-24 DIAGNOSIS — E890 Postprocedural hypothyroidism: Secondary | ICD-10-CM

## 2023-07-24 DIAGNOSIS — M1990 Unspecified osteoarthritis, unspecified site: Secondary | ICD-10-CM

## 2023-07-24 DIAGNOSIS — Z0289 Encounter for other administrative examinations: Secondary | ICD-10-CM

## 2023-07-24 DIAGNOSIS — J449 Chronic obstructive pulmonary disease, unspecified: Secondary | ICD-10-CM | POA: Insufficient documentation

## 2023-07-24 MED ORDER — SYNTHROID 50 MCG PO TABS: 50.0000 ug | ORAL_TABLET | Freq: Every day | ORAL | 1 refills | Status: DC

## 2023-08-21 ENCOUNTER — Other Ambulatory Visit: Payer: Self-pay | Admitting: Obstetrics and Gynecology

## 2023-08-21 DIAGNOSIS — Z4689 Encounter for fitting and adjustment of other specified devices: Secondary | ICD-10-CM

## 2023-08-22 ENCOUNTER — Other Ambulatory Visit: Payer: Self-pay

## 2023-08-22 DIAGNOSIS — Z4689 Encounter for fitting and adjustment of other specified devices: Secondary | ICD-10-CM

## 2023-08-22 MED ORDER — TRIMO-SAN 0.025-0.01 % VA GEL: VAGINAL | 0 refills | Status: AC

## 2023-08-29 ENCOUNTER — Encounter: Payer: Self-pay | Admitting: Obstetrics and Gynecology

## 2023-08-29 ENCOUNTER — Ambulatory Visit: Admitting: Obstetrics and Gynecology

## 2023-08-29 VITALS — BP 154/81 | HR 73 | Ht 64.0 in | Wt 136.2 lb

## 2023-08-29 DIAGNOSIS — Z4689 Encounter for fitting and adjustment of other specified devices: Secondary | ICD-10-CM

## 2023-08-29 NOTE — Progress Notes (Signed)
 HPI:      [name] is a 88 y.o. G2P2002 who LMP was No LMP recorded. Patient is postmenopausal.  Subjective:   She presents today for pessary care.  She reports that she is doing fine with the pessary and there are no significant changes.  She is using the Trimo-San cream as directed.    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 She does not have any pertinent problems on file. She  has a past surgical history that includes Hernia repair; Tonsillectomy; Thyroidectomy; Total hip arthroplasty; Cataract extraction; Thyroglossal duct cyst; Tonsillectomy; Total hip arthroplasty (Right, 08/26/2014); Eye surgery; Eye surgery; and Total knee arthroplasty (Left, 10/25/2015). Her family history includes Alzheimer's disease in her mother; Breast cancer in her paternal aunt; Cancer in her father; Diabetes in her son; Hypertension in her father; Stroke in her paternal grandfather; Tuberculosis in her paternal grandmother. She  reports that she has never smoked. She has never used smokeless tobacco. She reports that she does not drink alcohol and does not use drugs. She has a current medication list which includes the following prescription(s): albuterol , ascorbic acid, aspirin ec, biotin, calcium-magnesium-zinc, cholecalciferol, cranberry-vitamin c-probiotic, ibuprofen, lutein, trimo-san, synthroid , trimo-san, trimo-san,  trimo-san, valsartan , and vitamin a. She is allergic to macrobid  [nitrofurantoin ].       Review of Systems:  Review of Systems  Constitutional: Denied constitutional symptoms, night sweats, recent illness, fatigue, fever, insomnia and weight loss.  Eyes: Denied eye symptoms, eye pain, photophobia, vision change and visual disturbance.  Ears/Nose/Throat/Neck: Denied ear, nose, throat or neck symptoms, hearing loss, nasal discharge, sinus congestion and sore throat.  Cardiovascular: Denied cardiovascular symptoms, arrhythmia, chest pain/pressure, edema, exercise intolerance, orthopnea and palpitations.  Respiratory: Denied pulmonary symptoms, asthma, pleuritic pain, productive sputum, cough, dyspnea and wheezing.  Gastrointestinal: Denied, gastro-esophageal reflux, melena, nausea and vomiting.  Genitourinary: Denied genitourinary symptoms including symptomatic vaginal discharge, pelvic relaxation issues, and urinary complaints.  Musculoskeletal: Denied musculoskeletal symptoms, stiffness, swelling, muscle weakness and myalgia.  Dermatologic: Denied dermatology symptoms, rash and scar.  Neurologic: Denied neurology symptoms, dizziness, headache, neck pain and syncope.  Psychiatric: Denied psychiatric symptoms, anxiety and depression.  Endocrine: Denied endocrine symptoms including hot flashes and night sweats.   Meds:   Current Outpatient Medications on File Prior to Visit  Medication Sig Dispense Refill   albuterol  (VENTOLIN  HFA) 108 (90 Base) MCG/ACT inhaler Inhale 2 puffs into the lungs every 6 (six) hours as needed for wheezing or shortness of breath. 18 g 8   ascorbic acid (VITAMIN C) 1000 MG tablet Take 1 tablet by mouth daily.     aspirin EC 81 MG tablet Take by mouth.     Biotin 10 MG CAPS Take by mouth.     CALCIUM-MAGNESIUM-ZINC PO Take by mouth.     cholecalciferol (VITAMIN D ) 1000 units tablet Take 1,000 Units by mouth daily.     Cranberry-Vitamin C-Probiotic (AZO CRANBERRY PO)  Take 2 tablets by mouth daily.     ibuprofen (ADVIL,MOTRIN) 200 MG tablet  Take by mouth.     Lutein 20 MG CAPS Take by mouth.     Oxyquinoline-Sod Lauryl Sulf (TRIMO-SAN) 0.025-0.01 % GEL Place in vagina daily. 113.4 g 0   SYNTHROID  50 MCG tablet Take 1 tablet (50 mcg total) by mouth daily. 90 tablet 1   TRIMO-SAN 0.025-0.01 % GEL Place in vagina daily. 113.4 g 0   TRIMO-SAN 0.025-0.01 % GEL Place in vagina daily. 113.4 g 0   TRIMO-SAN 0.025-0.01 % GEL Place in vagina daily. 113.4 g 0   valsartan  (DIOVAN ) 80 MG tablet Take 1 tablet (80 mg total) by mouth daily. 90 tablet 1   vitamin A 8000 UNIT capsule Take by mouth.     No current facility-administered medications on file prior to visit.      Objective:     Vitals:   08/29/23 1123  BP: (!) 154/81  Pulse: 73   Filed Weights   08/29/23 1123  Weight: 136 lb 3.2 oz (61.8 kg)              Pessary Care Pessary removed and cleaned.  Vagina checked by speculum exam- without erosions - pessary replaced.           Assessment:    G2P2002 Patient Active Problem List   Diagnosis Date Noted   Chronic obstructive pulmonary disease (HCC) 07/24/2023   Postoperative hypothyroidism 12/06/2018   S/P parathyroidectomy 12/06/2018   Vaginal odor 12/12/2017   Mixed hyperlipidemia 12/04/2017   Incomplete uterovaginal prolapse 05/03/2016   Vitamin D  deficiency 03/31/2016   OA (osteoarthritis) of knee 10/25/2015   Nocturia 06/02/2015   Cystocele, midline 06/02/2015   Breast cancer screening 03/10/2015   OA (osteoarthritis) of hip 08/26/2014   Hypercalcemia 07/06/2014   Vaginal erosion secondary to pessary use (HCC) 07/01/2014   Essential hypertension 04/10/2013   Arthritis 04/10/2013   Osteoporosis 04/10/2013     1. Pessary maintenance     Patient doing well with her pessary.   Plan:            1.  Follow-up in 5 months. Orders No orders of the defined types were placed in this encounter.   No orders of the defined types  were placed in this encounter.     F/U  Return in about 5 months (around 01/29/2024) for For pessary care.[name], M.D. 08/29/2023 11:46 AM

## 2023-08-29 NOTE — Progress Notes (Signed)
 Patient presents for pessary maintenance. She states still experiencing bleeding after vacuuming and exertion but it stops quickly. Patient states no other questions or concerns at this time.

## 2023-10-03 DIAGNOSIS — H43813 Vitreous degeneration, bilateral: Secondary | ICD-10-CM | POA: Diagnosis not present

## 2023-10-03 DIAGNOSIS — H35373 Puckering of macula, bilateral: Secondary | ICD-10-CM | POA: Diagnosis not present

## 2023-10-03 DIAGNOSIS — H348322 Tributary (branch) retinal vein occlusion, left eye, stable: Secondary | ICD-10-CM | POA: Diagnosis not present

## 2023-10-03 DIAGNOSIS — H43393 Other vitreous opacities, bilateral: Secondary | ICD-10-CM | POA: Diagnosis not present

## 2023-10-04 ENCOUNTER — Other Ambulatory Visit: Payer: Self-pay | Admitting: Nurse Practitioner

## 2023-10-04 DIAGNOSIS — I10 Essential (primary) hypertension: Secondary | ICD-10-CM

## 2023-10-05 NOTE — Telephone Encounter (Signed)
 Requested Prescriptions  Pending Prescriptions Disp Refills   valsartan  (DIOVAN ) 80 MG tablet [Pharmacy Med Name: VALSARTAN  80 MG TABLET] 90 tablet 1    Sig: Take 1 tablet (80 mg total) by mouth daily.     Cardiovascular:  Angiotensin Receptor Blockers Failed - 10/05/2023 12:56 PM      Failed - Last BP in normal range    BP Readings from Last 1 Encounters:  08/29/23 (!) 154/81         Passed - Cr in normal range and within 180 days    Creat  Date Value Ref Range Status  04/13/2023 0.69 0.60 - 0.95 mg/dL Final         Passed - K in normal range and within 180 days    Potassium  Date Value Ref Range Status  04/13/2023 4.2 3.5 - 5.3 mmol/L Final  02/15/2011 3.6 3.5 - 5.1 mmol/L Final         Passed - Patient is not pregnant      Passed - Valid encounter within last 6 months    Recent Outpatient Visits           2 months ago Essential hypertension   [facility] Gareth Mliss FALCON, FNP   5 months ago Annual physical exam   [facility] Gareth Mliss FALCON, FNP       Future Appointments             In 3 months Gareth, Mliss FALCON, FNP [facility] [place]

## 2023-12-18 ENCOUNTER — Other Ambulatory Visit: Payer: Self-pay | Admitting: Obstetrics and Gynecology

## 2023-12-18 DIAGNOSIS — Z4689 Encounter for fitting and adjustment of other specified devices: Secondary | ICD-10-CM

## 2023-12-21 ENCOUNTER — Other Ambulatory Visit: Payer: Self-pay

## 2023-12-21 DIAGNOSIS — Z4689 Encounter for fitting and adjustment of other specified devices: Secondary | ICD-10-CM

## 2023-12-21 MED ORDER — TRIMO-SAN 0.025-0.01 % VA GEL: VAGINAL | 0 refills | Status: AC

## 2024-01-02 ENCOUNTER — Other Ambulatory Visit: Payer: Self-pay | Admitting: Nurse Practitioner

## 2024-01-02 DIAGNOSIS — E890 Postprocedural hypothyroidism: Secondary | ICD-10-CM

## 2024-01-03 NOTE — Telephone Encounter (Signed)
 Requested Prescriptions  Pending Prescriptions Disp Refills   SYNTHROID  50 MCG tablet [Pharmacy Med Name: SYNTHROID  50 MCG TABLET] 90 tablet 0    Sig: Take 1 tablet (50 mcg total) by mouth daily.     Endocrinology:  Hypothyroid Agents Passed - 01/03/2024 12:52 PM      Passed - TSH in normal range and within 360 days    TSH  Date Value Ref Range Status  04/13/2023 1.35 0.40 - 4.50 mIU/L Final         Passed - Valid encounter within last 12 months    Recent Outpatient Visits           5 months ago Essential hypertension   [facility] Gareth Mliss FALCON, FNP   8 months ago Annual physical exam   [facility] Gareth Mliss FALCON, FNP       Future Appointments             In 3 weeks Gareth, Mliss FALCON, FNP [facility] [place]

## 2024-01-22 ENCOUNTER — Ambulatory Visit: Admitting: Obstetrics & Gynecology

## 2024-01-24 ENCOUNTER — Ambulatory Visit (INDEPENDENT_AMBULATORY_CARE_PROVIDER_SITE_OTHER): Admitting: Nurse Practitioner

## 2024-01-24 ENCOUNTER — Encounter: Payer: Self-pay | Admitting: Nurse Practitioner

## 2024-01-24 VITALS — BP 126/88 | HR 58 | Temp 97.0°F | Ht 64.0 in | Wt 139.0 lb

## 2024-01-24 DIAGNOSIS — M1712 Unilateral primary osteoarthritis, left knee: Secondary | ICD-10-CM | POA: Diagnosis not present

## 2024-01-24 DIAGNOSIS — I10 Essential (primary) hypertension: Secondary | ICD-10-CM

## 2024-01-24 DIAGNOSIS — M1611 Unilateral primary osteoarthritis, right hip: Secondary | ICD-10-CM

## 2024-01-24 DIAGNOSIS — Z131 Encounter for screening for diabetes mellitus: Secondary | ICD-10-CM | POA: Diagnosis not present

## 2024-01-24 DIAGNOSIS — M810 Age-related osteoporosis without current pathological fracture: Secondary | ICD-10-CM

## 2024-01-24 DIAGNOSIS — E782 Mixed hyperlipidemia: Secondary | ICD-10-CM

## 2024-01-24 DIAGNOSIS — E559 Vitamin D deficiency, unspecified: Secondary | ICD-10-CM

## 2024-01-24 DIAGNOSIS — J449 Chronic obstructive pulmonary disease, unspecified: Secondary | ICD-10-CM

## 2024-01-24 DIAGNOSIS — E890 Postprocedural hypothyroidism: Secondary | ICD-10-CM | POA: Diagnosis not present

## 2024-01-24 DIAGNOSIS — Z13220 Encounter for screening for lipoid disorders: Secondary | ICD-10-CM

## 2024-01-24 DIAGNOSIS — R7303 Prediabetes: Secondary | ICD-10-CM | POA: Diagnosis not present

## 2024-01-24 NOTE — Progress Notes (Signed)
 "  BP 126/88   Pulse (!) 58   Temp (!) 97 F (36.1 C)   Ht 5' 4 (1.626 m)   Wt 139 lb (63 kg)   SpO2 97%   BMI 23.86 kg/m    Subjective:    Patient ID: Haley Schneider, female    DOB: Oct 06, 1935, 89 y.o.   MRN: [id number]  HPI: Haley Schneider is a 89 y.o. female  Chief Complaint  Patient presents with   Medical Management of Chronic Issues   Discussed the use of AI scribe software for clinical note transcription with the patient, who gave verbal consent to proceed.  History of Present Illness Haley Schneider is an 89 year old female who presents for a six month follow-up.  Hypertension - Blood pressure well-controlled on valsartan  80 mg daily - Recent blood pressure reading 126/88 mmHg - No adverse effects from antihypertensive therapy  Chronic obstructive pulmonary disease (copd) - Uses inhaler as needed - No new respiratory symptoms - Expresses concern regarding potential side effects of new medications  Hypothyroidism - Takes levothyroxine  50 micrograms daily - Recent TSH levels within normal range - No symptoms of hypothyroidism  Osteoarthritis and musculoskeletal pain - Manages joint pain with ibuprofen as needed - No increase in pain or functional decline  Osteoporosis and bone health - Overdue for bone density scan - No history of recent fractures  Hyperlipidemia - Takes aspirin 81 mg daily - Recent LDL level 105 mg/dL  Vitamin d  deficiency - No new symptoms related to vitamin D  deficiency  Preventive health maintenance - Overdue for mammogram  Psychosocial and functional status - Lives alone in a large house - Manages living space to conserve energy during winter months - Expresses concern for son hospitalized with diabetic foot ulcer and cardiac complications - No impact on her own health or function  General review of systems - No new symptoms - No issues with current medications         04/13/2023    9:48 AM 10/03/2022   10:48 AM  04/03/2022   10:04 AM  Depression screen PHQ 2/9  Decreased Interest 0 0 0  Down, Depressed, Hopeless 0 0 0  PHQ - 2 Score 0 0 0  Altered sleeping 0    Tired, decreased energy 0    Change in appetite 0    Feeling bad or failure about yourself  0    Trouble concentrating 0    Moving slowly or fidgety/restless 0    Suicidal thoughts 0    PHQ-9 Score 0     Difficult doing work/chores Not difficult at all       Data saved with a previous flowsheet row definition    Relevant past medical, surgical, family and social history reviewed and updated as indicated. Interim medical history since our last visit reviewed. Allergies and medications reviewed and updated.  Review of Systems  Constitutional: Negative for fever or weight change.  Respiratory: Negative for cough and shortness of breath.   Cardiovascular: Negative for chest pain or palpitations.  Gastrointestinal: Negative for abdominal pain, no bowel changes.  Musculoskeletal: Negative for gait problem or joint swelling.  Skin: Negative for rash.  Neurological: Negative for dizziness or headache.  No other specific complaints in a complete review of systems (except as listed in HPI above).      Objective:      BP 126/88   Pulse (!) 58   Temp (!) 97 F (36.1 C)  Ht 5' 4 (1.626 m)   Wt 139 lb (63 kg)   SpO2 97%   BMI 23.86 kg/m    Wt Readings from Last 3 Encounters:  01/24/24 139 lb (63 kg)  08/29/23 136 lb 3.2 oz (61.8 kg)  07/24/23 134 lb (60.8 kg)    Physical Exam VITALS: BP- 126/88 MEASUREMENTS: Weight- 139. GENERAL: Alert, cooperative, well developed, no acute distress. HEENT: Normocephalic, normal oropharynx, moist mucous membranes. CHEST: Clear to auscultation bilaterally, no wheezes, rhonchi, or crackles. CARDIOVASCULAR: Normal heart rate and rhythm, S1 and S2 normal without murmurs. ABDOMEN: Soft, non-tender, non-distended, without organomegaly, normal bowel sounds. EXTREMITIES: No cyanosis or  edema. NEUROLOGICAL: Cranial nerves grossly intact, moves all extremities without gross motor or sensory deficit.  Results for orders placed or performed in visit on 04/13/23  TSH   Collection Time: 04/13/23 11:04 AM  Result Value Ref Range   TSH 1.35 0.40 - 4.50 mIU/L  Hemoglobin A1c   Collection Time: 04/13/23 11:04 AM  Result Value Ref Range   Hgb A1c MFr Bld 5.6 <5.7 % of total Hgb   Mean Plasma Glucose 114 mg/dL   eAG (mmol/L) 6.3 mmol/L  Lipid panel   Collection Time: 04/13/23 11:04 AM  Result Value Ref Range   Cholesterol 195 <200 mg/dL   HDL 67 > OR = 50 mg/dL   Triglycerides 874 <849 mg/dL   LDL Cholesterol (Calc) 105 (H) mg/dL (calc)   Total CHOL/HDL Ratio 2.9 <5.0 (calc)   Non-HDL Cholesterol (Calc) 128 <130 mg/dL (calc)  CBC with Differential/Platelet   Collection Time: 04/13/23 11:04 AM  Result Value Ref Range   WBC 7.5 3.8 - 10.8 Thousand/uL   RBC 5.10 3.80 - 5.10 Million/uL   Hemoglobin 14.2 11.7 - 15.5 g/dL   HCT 56.5 64.9 - 54.9 %   MCV 85.1 80.0 - 100.0 fL   MCH 27.8 27.0 - 33.0 pg   MCHC 32.7 32.0 - 36.0 g/dL   RDW 86.0 88.9 - 84.9 %   Platelets 292 140 - 400 Thousand/uL   MPV 10.6 7.5 - 12.5 fL   Neutro Abs 4,860 1,500 - 7,800 cells/uL   Absolute Lymphocytes 1,995 850 - 3,900 cells/uL   Absolute Monocytes 540 200 - 950 cells/uL   Eosinophils Absolute 68 15 - 500 cells/uL   Basophils Absolute 38 0 - 200 cells/uL   Neutrophils Relative % 64.8 %   Total Lymphocyte 26.6 %   Monocytes Relative 7.2 %   Eosinophils Relative 0.9 %   Basophils Relative 0.5 %  Comprehensive metabolic panel with GFR   Collection Time: 04/13/23 11:04 AM  Result Value Ref Range   Glucose, Bld 90 65 - 139 mg/dL   BUN 11 7 - 25 mg/dL   Creat 9.30 9.39 - 9.04 mg/dL   eGFR 84 > OR = 60 fO/fpw/8.26f7   BUN/Creatinine Ratio SEE NOTE: 6 - 22 (calc)   Sodium 132 (L) 135 - 146 mmol/L   Potassium 4.2 3.5 - 5.3 mmol/L   Chloride 99 98 - 110 mmol/L   CO2 26 20 - 32 mmol/L    Calcium 9.3 8.6 - 10.4 mg/dL   Total Protein 6.5 6.1 - 8.1 g/dL   Albumin 4.1 3.6 - 5.1 g/dL   Globulin 2.4 1.9 - 3.7 g/dL (calc)   AG Ratio 1.7 1.0 - 2.5 (calc)   Total Bilirubin 0.6 0.2 - 1.2 mg/dL   Alkaline phosphatase (APISO) 82 37 - 153 U/L   AST 15 10 - 35 U/L  ALT 10 6 - 29 U/L          Assessment & Plan:   Problem List Items Addressed This Visit       Cardiovascular and Mediastinum   Essential hypertension - Primary   Relevant Orders   CBC with Differential/Platelet   Comprehensive metabolic panel with GFR     Respiratory   Chronic obstructive pulmonary disease (HCC)     Endocrine   Postoperative hypothyroidism   Relevant Orders   TSH     Musculoskeletal and Integument   OA (osteoarthritis) of hip   OA (osteoarthritis) of knee   Osteoporosis   Relevant Orders   VITAMIN D  25 Hydroxy (Vit-D Deficiency, Fractures)     Other   Vitamin D  deficiency   Relevant Orders   VITAMIN D  25 Hydroxy (Vit-D Deficiency, Fractures)   Mixed hyperlipidemia   Relevant Orders   Lipid panel   Other Visit Diagnoses       Screening for diabetes mellitus         Screening for cholesterol level         Prediabetes       Relevant Orders   Comprehensive metabolic panel with GFR   Hemoglobin A1c        Assessment and Plan Assessment & Plan Essential hypertension Blood pressure is well-controlled at 126/88 mmHg. - Continue valsartan  80 mg daily.  Chronic obstructive pulmonary disease COPD is managed with an epidural inhaler as needed. Discussed RSVP vaccine, recommended despite COPD, with reassurance about safety and efficacy. - Recommended RSVP vaccine.  Postprocedural hypothyroidism TSH levels are within normal range with current levothyroxine  therapy. - Continue levothyroxine  50 mcg daily.  Age-related osteoporosis She declined bone density screening due to age. - Removed bone density screening from health maintenance list.  Mixed hyperlipidemia LDL was  105 mg/dL on last lipid panel.  Vitamin D  deficiency No specific changes in management during this visit.  Prediabetes A1c is within normal range, indicating good control.  General health maintenance She declined mammogram and bone density screening due to age. Discussed RSVP vaccine and its benefits. - Recommended RSVP vaccine.        Follow up plan: Return in about 6 months (around 07/23/2024) for follow up. "

## 2024-02-05 ENCOUNTER — Ambulatory Visit: Admitting: Obstetrics & Gynecology

## 2024-07-23 ENCOUNTER — Ambulatory Visit: Admitting: Nurse Practitioner
